# Patient Record
Sex: Female | Born: 1985 | Race: Black or African American | Hispanic: No | Marital: Single | Smoking: Never smoker
Health system: Southern US, Community
[De-identification: ages and names within clinical notes are randomized; demographics above are authoritative.]

## PROBLEM LIST (undated history)

## (undated) ENCOUNTER — Ambulatory Visit (HOSPITAL_COMMUNITY): Admission: EM | Payer: Medicaid Other | Source: Home / Self Care

## (undated) DIAGNOSIS — R443 Hallucinations, unspecified: Secondary | ICD-10-CM

## (undated) DIAGNOSIS — F32A Depression, unspecified: Secondary | ICD-10-CM

## (undated) DIAGNOSIS — F29 Unspecified psychosis not due to a substance or known physiological condition: Secondary | ICD-10-CM

## (undated) DIAGNOSIS — F329 Major depressive disorder, single episode, unspecified: Secondary | ICD-10-CM

## (undated) HISTORY — PX: NO PAST SURGERIES: SHX2092

---

## 2010-05-21 ENCOUNTER — Emergency Department (HOSPITAL_COMMUNITY)
Admission: EM | Admit: 2010-05-21 | Discharge: 2010-05-22 | Payer: Self-pay | Source: Home / Self Care | Admitting: Emergency Medicine

## 2010-11-01 LAB — DIFFERENTIAL
Basophils Relative: 0 % (ref 0–1)
Eosinophils Absolute: 0 10*3/uL (ref 0.0–0.7)
Eosinophils Relative: 1 % (ref 0–5)
Lymphs Abs: 2.5 10*3/uL (ref 0.7–4.0)
Monocytes Absolute: 0.6 10*3/uL (ref 0.1–1.0)
Monocytes Relative: 9 % (ref 3–12)
Neutrophils Relative %: 53 % (ref 43–77)

## 2010-11-01 LAB — URINALYSIS, ROUTINE W REFLEX MICROSCOPIC
Bilirubin Urine: NEGATIVE
Glucose, UA: NEGATIVE mg/dL
Hgb urine dipstick: NEGATIVE
Ketones, ur: 15 mg/dL — AB
Protein, ur: NEGATIVE mg/dL
Urobilinogen, UA: 0.2 mg/dL (ref 0.0–1.0)

## 2010-11-01 LAB — BASIC METABOLIC PANEL
CO2: 28 mEq/L (ref 19–32)
Calcium: 9.5 mg/dL (ref 8.4–10.5)
Chloride: 103 mEq/L (ref 96–112)
GFR calc Af Amer: >60 mL/min (ref >60)
Glucose, Bld: 97 mg/dL (ref 70–99)
Sodium: 139 mEq/L (ref 135–145)

## 2010-11-01 LAB — CBC
HCT: 38.1 % (ref 36.0–46.0)
Hemoglobin: 12.5 g/dL (ref 12.0–15.0)
MCH: 25.7 pg — ABNORMAL LOW (ref 26.0–34.0)
MCHC: 32.8 g/dL (ref 30.0–36.0)
MCV: 78.4 fL (ref 78.0–100.0)
RBC: 4.86 MIL/uL (ref 3.87–5.11)

## 2010-11-01 LAB — POCT PREGNANCY, URINE: Preg Test, Ur: NEGATIVE

## 2011-02-22 ENCOUNTER — Emergency Department (HOSPITAL_COMMUNITY)
Admission: EM | Admit: 2011-02-22 | Discharge: 2011-02-23 | Disposition: A | Discharge status: Home / Self Care | Payer: No Typology Code available for payment source | Attending: Emergency Medicine | Admitting: Emergency Medicine

## 2011-02-22 DIAGNOSIS — M542 Cervicalgia: Secondary | ICD-10-CM | POA: Insufficient documentation

## 2011-02-22 DIAGNOSIS — S139XXA Sprain of joints and ligaments of unspecified parts of neck, initial encounter: Secondary | ICD-10-CM | POA: Insufficient documentation

## 2011-02-22 DIAGNOSIS — M549 Dorsalgia, unspecified: Secondary | ICD-10-CM | POA: Insufficient documentation

## 2011-02-22 DIAGNOSIS — F329 Major depressive disorder, single episode, unspecified: Secondary | ICD-10-CM | POA: Insufficient documentation

## 2011-02-22 DIAGNOSIS — F3289 Other specified depressive episodes: Secondary | ICD-10-CM | POA: Insufficient documentation

## 2011-08-15 ENCOUNTER — Encounter: Payer: Self-pay | Admitting: *Deleted

## 2011-08-15 ENCOUNTER — Emergency Department (HOSPITAL_COMMUNITY)
Admission: EM | Admit: 2011-08-15 | Discharge: 2011-08-16 | Disposition: A | Discharge status: Home / Self Care | Payer: Self-pay | Attending: Emergency Medicine | Admitting: Emergency Medicine

## 2011-08-15 DIAGNOSIS — H81399 Other peripheral vertigo, unspecified ear: Secondary | ICD-10-CM | POA: Insufficient documentation

## 2011-08-15 NOTE — ED Notes (Signed)
She has had a headache with dizziness and feeling lightheaded for 2 days.  lmp 2 weeks ago

## 2011-08-16 LAB — POCT I-STAT, CHEM 8
BUN: 6 mg/dL (ref 6–23)
Calcium, Ion: 1.25 mmol/L (ref 1.12–1.32)
Chloride: 102 mEq/L (ref 96–112)
Creatinine, Ser: 0.6 mg/dL (ref 0.50–1.10)
TCO2: 28 mmol/L (ref 0–100)

## 2011-08-16 MED ORDER — MECLIZINE HCL 25 MG PO TABS: 25.0000 mg | ORAL_TABLET | Freq: Three times a day (TID) | ORAL | Status: AC | PRN

## 2011-08-16 NOTE — ED Provider Notes (Signed)
Medical screening examination/treatment/procedure(s) were performed by non-physician practitioner and as supervising physician I was immediately available for consultation/collaboration.   Lyanne Co, MD 08/16/11 [phone]

## 2011-08-16 NOTE — ED Provider Notes (Signed)
History     CSN: [id number]  Arrival date & time 08/15/11  2217   First MD Initiated Contact with Patient 08/16/11 0106      Chief Complaint  Patient presents with  . Dizziness     HPI  History provided by the patient and mother. Patient is a 25 year old female with no significant past medical history who presents with symptoms of generalized fatigue and this feeling some lightheadedness and dizziness for the past few days. Patient describes her dizziness and lightheadedness as short episodes of feeling a little cloudy headed in off balance. Symptoms are made worse by sudden movements or twisting of her head. Symptoms last only a few seconds are improved with remaining still or standing straight. Patient denies any headache symptoms to me. Patient also reports feeling increased fatigue and sleepiness throughout the day. Does report having poor sleep last night. Patient had a normal last. One to 2 weeks ago that lasted 3-4 days. Patient denies any other symptoms. Patient denies any recent URI symptoms, no fever no chills no sweats no cough no nasal congestion.   History reviewed. No pertinent past medical history.  History reviewed. No pertinent past surgical history.  History reviewed. No pertinent family history.  History  Substance Use Topics  . Smoking status: Never Smoker   . Smokeless tobacco: Not on file  . Alcohol Use: Yes    OB History    Grav Para Term Preterm Abortions TAB SAB Ect Mult Living                  Review of Systems  Constitutional: Negative for fever and chills.  Eyes: Negative for pain.  Respiratory: Negative for cough and shortness of breath.   Gastrointestinal: Negative for nausea, vomiting, abdominal pain, diarrhea and constipation.  Neurological: Positive for dizziness and light-headedness. Negative for syncope, weakness, numbness and headaches.  All other systems reviewed and are negative.    Allergies  Review of patient's allergies  indicates no known allergies.  Home Medications  No current outpatient prescriptions on file.  BP 120/67  Pulse 90  Temp(Src) 98.3 F (36.8 C) (Oral)  Resp 18  SpO2 100%  LMP 08/01/2011  Physical Exam  Nursing note and vitals reviewed. Constitutional: She is oriented to person, place, and time. She appears well-developed and well-nourished. No distress.  HENT:  Head: Normocephalic and atraumatic.  Right Ear: External ear normal.  Left Ear: External ear normal.  Mouth/Throat: Oropharynx is clear and moist.  Eyes: Conjunctivae and EOM are normal. Pupils are equal, round, and reactive to light.  Neck: Normal range of motion. Neck supple.  Cardiovascular: Normal rate and regular rhythm.   Pulmonary/Chest: Effort normal and breath sounds normal. She has no wheezes. She has no rales.  Abdominal: Soft. Bowel sounds are normal. There is no tenderness. There is no rebound and no guarding.  Musculoskeletal: Normal range of motion.  Neurological: She is alert and oriented to person, place, and time. She has normal strength. No cranial nerve deficit or sensory deficit. Coordination and gait normal.       Mild lateral nystagmus with Dix Hallpike maneuver to the left side.  Skin: Skin is warm and dry. No rash noted.  Psychiatric: She has a normal mood and affect. Her behavior is normal.    ED Course  Procedures (including critical care time)   Labs Reviewed  I-STAT, CHEM 8   . Patient i-STAT did not cross over in computer. Results are as follows:  Sodium 141, potassium 3.6, chloride 102, calcium 1.25, CO2 28, glucose 100, BUN 6, creatinine 0.6, hematocrit 43%, hemoglobin 14.6.   1. Peripheral vertigo      MDM  1:25 AM patient seen and evaluated. Patient in no acute distress.        Angus Seller, [place] 08/16/11 [phone]

## 2011-08-16 NOTE — ED Notes (Signed)
Rx given x1 D/c instructions reviewed w/ pt and family - pt and family deny any further questions or concerns at present.  

## 2011-11-19 ENCOUNTER — Encounter (HOSPITAL_COMMUNITY): Payer: Self-pay | Admitting: *Deleted

## 2011-11-19 ENCOUNTER — Emergency Department (HOSPITAL_COMMUNITY)
Admission: EM | Admit: 2011-11-19 | Discharge: 2011-11-19 | Disposition: A | Discharge status: Home / Self Care | Payer: Medicaid Other | Attending: Emergency Medicine | Admitting: Emergency Medicine

## 2011-11-19 DIAGNOSIS — F411 Generalized anxiety disorder: Secondary | ICD-10-CM | POA: Insufficient documentation

## 2011-11-19 DIAGNOSIS — F419 Anxiety disorder, unspecified: Secondary | ICD-10-CM

## 2011-11-19 DIAGNOSIS — R110 Nausea: Secondary | ICD-10-CM | POA: Insufficient documentation

## 2011-11-19 HISTORY — DX: Unspecified psychosis not due to a substance or known physiological condition: F29

## 2011-11-19 LAB — CBC
HCT: 39.4 % (ref 36.0–46.0)
MCH: 25.2 pg — ABNORMAL LOW (ref 26.0–34.0)
MCV: 75.9 fL — ABNORMAL LOW (ref 78.0–100.0)
Platelets: 277 10*3/uL (ref 150–400)
RDW: 12.9 % (ref 11.5–15.5)
WBC: 7.6 10*3/uL (ref 4.0–10.5)

## 2011-11-19 LAB — URINALYSIS, ROUTINE W REFLEX MICROSCOPIC
Bilirubin Urine: NEGATIVE
Glucose, UA: NEGATIVE mg/dL
Hgb urine dipstick: NEGATIVE
Specific Gravity, Urine: 1.005 (ref 1.005–1.030)

## 2011-11-19 LAB — ETHANOL: Alcohol, Ethyl (B): <11 mg/dL (ref 0–11)

## 2011-11-19 LAB — COMPREHENSIVE METABOLIC PANEL
Albumin: 4.4 g/dL (ref 3.5–5.2)
BUN: 5 mg/dL — ABNORMAL LOW (ref 6–23)
CO2: 23 mEq/L (ref 19–32)
Calcium: 9.8 mg/dL (ref 8.4–10.5)
Chloride: 100 mEq/L (ref 96–112)
Creatinine, Ser: 0.46 mg/dL — ABNORMAL LOW (ref 0.50–1.10)
GFR calc non Af Amer: >90 mL/min (ref >90)
Total Bilirubin: 0.3 mg/dL (ref 0.3–1.2)

## 2011-11-19 LAB — RAPID URINE DRUG SCREEN, HOSP PERFORMED
Cocaine: NOT DETECTED
Opiates: NOT DETECTED

## 2011-11-19 LAB — POCT PREGNANCY, URINE: Preg Test, Ur: NEGATIVE

## 2011-11-19 MED ORDER — LORAZEPAM 1 MG PO TABS
1.0000 mg | ORAL_TABLET | Freq: Once | ORAL | Status: AC
  Administered 2011-11-19: 1 mg via ORAL
  Filled 2011-11-19: qty 1

## 2011-11-19 MED ORDER — ONDANSETRON 4 MG PO TBDP
8.0000 mg | ORAL_TABLET | Freq: Once | ORAL | Status: AC
  Administered 2011-11-19: 8 mg via ORAL
  Filled 2011-11-19: qty 2

## 2011-11-19 MED ORDER — LORAZEPAM 1 MG PO TABS: 1.0000 mg | ORAL_TABLET | Freq: Three times a day (TID) | ORAL | Status: DC | PRN

## 2011-11-19 NOTE — ED Notes (Signed)
Pt is refusing blood draws.  Mother remains at the bedside.  Pt was wanded for security

## 2011-11-19 NOTE — ED Notes (Signed)
Pt called me to  her room admitted using weeds and marijuana in past , last use was couple years ago. Denies any recent use of any drugs.

## 2011-11-19 NOTE — ED Provider Notes (Signed)
History     CSN: [id number]  Arrival date & time 11/19/11  0101   First MD Initiated Contact with Patient 11/19/11 0406      Chief Complaint  Patient presents with  . Anxiety    (Consider location/radiation/quality/duration/timing/severity/associated sxs/prior treatment) Patient is a 26 y.o. female presenting with anxiety. The history is provided by the patient and a parent.  Anxiety  She had onset last evening of nausea and anxious feeling. The nausea is improving and anxiety seems to be improving but she is unable to get to sleep. Symptoms are moderate. She had an episode of a psychosis about 1-1/2 years ago and states this is actually feels similar to the way she felt when her psychosis started. It is not as severe as her psychosis. She denies abdominal pain, fever, chills, sweats. She denies dysuria. Last menses was 3 weeks ago and was normal. She denies dyspnea. She denies depression. She denies hallucinations.  Past Medical History  Diagnosis Date  . Psychosis     per pt and family last year    History reviewed. No pertinent past surgical history.  No family history on file.  History  Substance Use Topics  . Smoking status: Never Smoker   . Smokeless tobacco: Not on file  . Alcohol Use: Yes    OB History    Grav Para Term Preterm Abortions TAB SAB Ect Mult Living                  Review of Systems  All other systems reviewed and are negative.    Allergies  Review of patient's allergies indicates no known allergies.  Home Medications  No current outpatient prescriptions on file.  BP 121/69  Pulse 101  Temp(Src) 98.8 F (37.1 C) (Oral)  Resp 20  SpO2 100%  LMP 10/29/2011  Physical Exam  Nursing note and vitals reviewed.  26 year old female is resting comfortably and in no acute distress. Vital signs are significant for borderline tachycardia with heart rate 101. Oxygen saturation is 100% which is normal. Head is normal cephalic and atraumatic.  PERRLA, EOMI. Her [place] Guest is clear. Neck is nontender and supple. Back is nontender. Lungs are clear without rales, wheezes, rhonchi. Heart has regular rate rhythm without murmur. Abdomen is soft, flat, nontender without masses or hepatosplenomegaly and peristalsis is present. Extremities have full range of motion, no cyanosis or edema. Skin is warm and dry without rash. Neurologic: Mental status is normal, cranial nerves are intact, there are no focal motor or sensory deficits.  ED Course  Procedures (including critical care time)  Results for orders placed during the hospital encounter of 11/19/11  CBC      Component Value Range   WBC 7.6  4.0 - 10.5 (K/uL)   RBC 5.19 (*) 3.87 - 5.11 (MIL/uL)   Hemoglobin 13.1  12.0 - 15.0 (g/dL)   HCT 40.9  81.1 - 91.4 (%)   MCV 75.9 (*) 78.0 - 100.0 (fL)   MCH 25.2 (*) 26.0 - 34.0 (pg)   MCHC 33.2  30.0 - 36.0 (g/dL)   RDW 78.2  95.6 - 21.3 (%)   Platelets 277  150 - 400 (K/uL)  COMPREHENSIVE METABOLIC PANEL      Component Value Range   Sodium 137  135 - 145 (mEq/L)   Potassium 3.5  3.5 - 5.1 (mEq/L)   Chloride 100  96 - 112 (mEq/L)   CO2 23  19 - 32 (mEq/L)   Glucose, Bld 121 (*) 70 -  99 (mg/dL)   BUN 5 (*) 6 - 23 (mg/dL)   Creatinine, Ser 4.09 (*) 0.50 - 1.10 (mg/dL)   Calcium 9.8  8.4 - 81.1 (mg/dL)   Total Protein 8.1  6.0 - 8.3 (g/dL)   Albumin 4.4  3.5 - 5.2 (g/dL)   AST 14  0 - 37 (U/L)   ALT 10  0 - 35 (U/L)   Alkaline Phosphatase 59  39 - 117 (U/L)   Total Bilirubin 0.3  0.3 - 1.2 (mg/dL)   GFR calc non Af Amer >90  >90 (mL/min)   GFR calc Af Amer >90  >90 (mL/min)  ETHANOL      Component Value Range   Alcohol, Ethyl (B) <11  0 - 11 (mg/dL)  URINE RAPID DRUG SCREEN (HOSP PERFORMED)      Component Value Range   Opiates NONE DETECTED  NONE DETECTED    Cocaine NONE DETECTED  NONE DETECTED    Benzodiazepines NONE DETECTED  NONE DETECTED    Amphetamines NONE DETECTED  NONE DETECTED    Tetrahydrocannabinol NONE DETECTED  NONE  DETECTED    Barbiturates NONE DETECTED  NONE DETECTED   URINALYSIS, ROUTINE W REFLEX MICROSCOPIC      Component Value Range   Color, Urine YELLOW  YELLOW    APPearance CLEAR  CLEAR    Specific Gravity, Urine 1.005  1.005 - 1.030    pH 6.0  5.0 - 8.0    Glucose, UA NEGATIVE  NEGATIVE (mg/dL)   Hgb urine dipstick NEGATIVE  NEGATIVE    Bilirubin Urine NEGATIVE  NEGATIVE    Ketones, ur NEGATIVE  NEGATIVE (mg/dL)   Protein, ur NEGATIVE  NEGATIVE (mg/dL)   Urobilinogen, UA 0.2  0.0 - 1.0 (mg/dL)   Nitrite NEGATIVE  NEGATIVE    Leukocytes, UA NEGATIVE  NEGATIVE   POCT PREGNANCY, URINE      Component Value Range   Preg Test, Ur NEGATIVE  NEGATIVE    Zofran and lorazepam were ordered in the emergency Department the patient took them out and did not take them. Apparently she had some problems with psychiatric medications which have been prescribed in the past. An extensive discussion with the patient and her mother about the need for appropriate medication to treat psychiatric conditions. Although she is not psychotic today, her mental processes seem clearly disorganized and I am concerned that if she does not get started in psychiatric treatment that she could have an acute psychotic break. ACT Team has been consulted and she will be referred to the [facility].  1. Anxiety       MDM  Nausea of uncertain cause. Anxiety. It certainly is possible that she is in early stages of psychosis but there is no evidence of altered mental status at this point. Screening labs will be obtained and I will work on having her referred for outpatient management.        Dione Booze, MD 11/19/11 [phone]

## 2011-11-19 NOTE — Discharge Instructions (Signed)
You have a problem which needs to be evaluated by a psychiatrist. Please call the [facility] for an appointment. The phone number and information has been given to you by the ACT Team. You may take the Ativan which has been prescribed as often as 3 times a day. Take it as needed for anxiety.  Lorazepam tablets What is this medicine? LORAZEPAM (lor A ze pam) is a benzodiazepine. It is used to treat anxiety. This medicine may be used for other purposes; ask your health care provider or pharmacist if you have questions. What should I tell my health care provider before I take this medicine? They need to know if you have any of these conditions: -alcohol or drug abuse problem -bipolar disorder, depression, psychosis or other mental health condition -glaucoma -kidney or liver disease -lung disease or breathing difficulties -myasthenia gravis -Parkinson's disease -seizures or a history of seizures -suicidal thoughts -an unusual or allergic reaction to lorazepam, other benzodiazepines, foods, dyes, or preservatives -pregnant or trying to get pregnant -breast-feeding How should I use this medicine? Take this medicine by mouth with a glass of water. Follow the directions on the prescription label. If it upsets your stomach, take it with food or milk. Take your medicine at regular intervals. Do not take it more often than directed. Do not stop taking except on the advice of your doctor or health care professional. Talk to your pediatrician regarding the use of this medicine in children. Special care may be needed. Overdosage: If you think you have taken too much of this medicine contact a poison control center or emergency room at once. NOTE: This medicine is only for you. Do not share this medicine with others. What if I miss a dose? If you miss a dose, take it as soon as you can. If it is almost time for your next dose, take only that dose. Do not take double or extra  doses. What may interact with this medicine? -barbiturate medicines for inducing sleep or treating seizures, like phenobarbital -clozapine -medicines for depression, mental problems or psychiatric disturbances -medicines for sleep -phenytoin -probenecid -theophylline -valproic acid This list may not describe all possible interactions. Give your health care provider a list of all the medicines, herbs, non-prescription drugs, or dietary supplements you use. Also tell them if you smoke, drink alcohol, or use illegal drugs. Some items may interact with your medicine. What should I watch for while using this medicine? Visit your doctor or health care professional for regular checks on your progress. Your body may become dependent on this medicine, ask your doctor or health care professional if you still need to take it. However, if you have been taking this medicine regularly for some time, do not suddenly stop taking it. You must gradually reduce the dose or you may get severe side effects. Ask your doctor or health care professional for advice before increasing or decreasing the dose. Even after you stop taking this medicine it can still affect your body for several days. You may get drowsy or dizzy. Do not drive, use machinery, or do anything that needs mental alertness until you know how this medicine affects you. To reduce the risk of dizzy and fainting spells, do not stand or sit up quickly, especially if you are an older patient. Alcohol may increase dizziness and drowsiness. Avoid alcoholic drinks. Do not treat yourself for coughs, colds or allergies without asking your doctor or health care professional for advice. Some ingredients can increase  possible side effects. What side effects may I notice from receiving this medicine? Side effects that you should report to your doctor or health care professional as soon as possible: -changes in vision -confusion -depression -mood changes,  excitability or aggressive behavior -movement difficulty, staggering or jerky movements -muscle cramps -restlessness -weakness or tiredness Side effects that usually do not require medical attention (report to your doctor or health care professional if they continue or are bothersome): -constipation or diarrhea -difficulty sleeping, nightmares -dizziness, drowsiness -headache -nausea, vomiting This list may not describe all possible side effects. Call your doctor for medical advice about side effects. You may report side effects to FDA at 1-800-FDA-1088. Where should I keep my medicine? Keep out of the reach of children. This medicine can be abused. Keep your medicine in a safe place to protect it from theft. Do not share this medicine with anyone. Selling or giving away this medicine is dangerous and against the law. Store at room temperature between 20 and 25 degrees C (68 and 77 degrees F). Protect from light. Keep container tightly closed. Throw away any unused medicine after the expiration date. NOTE: This sheet is a summary. It may not cover all possible information. If you have questions about this medicine, talk to your doctor, pharmacist, or health care provider.  2012, [employer]. (02/05/2008 2:58:20 PM)

## 2011-11-19 NOTE — ED Notes (Signed)
Pt is here with her mother.  Pt is unable to sit still per her mother and "talking out of her head", pt ran out of the house this evening, per pt she felt "panicky".  Pt denies any auditory or visual hallucinations.  Pt denies any HI or SI.  Pt is calm and cooperative here in triage.  Pt states that these symptoms began today and denies any trigger for this.  Pts mother states that this occurred a year ago and pt was hospitalized at that time for a couple of days and it was felt that she may have psychosis (and ?scizophrenia).

## 2011-11-19 NOTE — ED Notes (Signed)
Pt showing signs of anxiety would not let phlebotomy get blood sample. Pt pretending to take her medication but palmed her ativan, than took it after i pointed it out to her.

## 2011-11-27 ENCOUNTER — Emergency Department (HOSPITAL_COMMUNITY)
Admission: EM | Admit: 2011-11-27 | Discharge: 2011-11-28 | Disposition: A | Discharge status: Other Acute Inpatient Hospital | Payer: Medicaid Other | Attending: Emergency Medicine | Admitting: Emergency Medicine

## 2011-11-27 ENCOUNTER — Encounter (HOSPITAL_COMMUNITY): Payer: Self-pay | Admitting: *Deleted

## 2011-11-27 DIAGNOSIS — F29 Unspecified psychosis not due to a substance or known physiological condition: Secondary | ICD-10-CM | POA: Insufficient documentation

## 2011-11-27 DIAGNOSIS — R000 Tachycardia, unspecified: Secondary | ICD-10-CM | POA: Insufficient documentation

## 2011-11-27 DIAGNOSIS — F489 Nonpsychotic mental disorder, unspecified: Secondary | ICD-10-CM | POA: Insufficient documentation

## 2011-11-27 DIAGNOSIS — G4700 Insomnia, unspecified: Secondary | ICD-10-CM | POA: Insufficient documentation

## 2011-11-27 DIAGNOSIS — F329 Major depressive disorder, single episode, unspecified: Secondary | ICD-10-CM

## 2011-11-27 LAB — COMPREHENSIVE METABOLIC PANEL
Alkaline Phosphatase: 62 U/L (ref 39–117)
BUN: 7 mg/dL (ref 6–23)
Calcium: 9.7 mg/dL (ref 8.4–10.5)
Creatinine, Ser: 0.6 mg/dL (ref 0.50–1.10)
GFR calc Af Amer: >90 mL/min (ref >90)
Glucose, Bld: 94 mg/dL (ref 70–99)
Potassium: 3.3 mEq/L — ABNORMAL LOW (ref 3.5–5.1)
Total Protein: 7.9 g/dL (ref 6.0–8.3)

## 2011-11-27 LAB — DIFFERENTIAL
Basophils Absolute: 0 10*3/uL (ref 0.0–0.1)
Lymphocytes Relative: 26 % (ref 12–46)
Monocytes Absolute: 0.5 10*3/uL (ref 0.1–1.0)
Neutro Abs: 4.2 10*3/uL (ref 1.7–7.7)

## 2011-11-27 LAB — URINALYSIS, ROUTINE W REFLEX MICROSCOPIC
Bilirubin Urine: NEGATIVE
Leukocytes, UA: NEGATIVE
Nitrite: NEGATIVE
Specific Gravity, Urine: 1.024 (ref 1.005–1.030)
pH: 7 (ref 5.0–8.0)

## 2011-11-27 LAB — CBC
HCT: 40.3 % (ref 36.0–46.0)
RBC: 5.22 MIL/uL — ABNORMAL HIGH (ref 3.87–5.11)
RDW: 13.1 % (ref 11.5–15.5)
WBC: 6.4 10*3/uL (ref 4.0–10.5)

## 2011-11-27 LAB — RAPID URINE DRUG SCREEN, HOSP PERFORMED
Amphetamines: NOT DETECTED
Benzodiazepines: NOT DETECTED
Cocaine: NOT DETECTED
Opiates: NOT DETECTED

## 2011-11-27 LAB — URINE MICROSCOPIC-ADD ON

## 2011-11-27 MED ORDER — LORAZEPAM 1 MG PO TABS
1.0000 mg | ORAL_TABLET | ORAL | Status: DC | PRN
  Administered 2011-11-27: 1 mg via ORAL
  Filled 2011-11-27: qty 1

## 2011-11-27 MED ORDER — TRAZODONE HCL 100 MG PO TABS: 100.0000 mg | ORAL_TABLET | Freq: Every day | ORAL | Status: DC

## 2011-11-27 MED ORDER — SODIUM CHLORIDE 0.9 % IV BOLUS (SEPSIS)
1000.0000 mL | Freq: Once | INTRAVENOUS | Status: AC
  Administered 2011-11-27: 1000 mL via INTRAVENOUS

## 2011-11-27 MED ORDER — RISPERIDONE 1 MG PO TABS
1.0000 mg | ORAL_TABLET | Freq: Two times a day (BID) | ORAL | Status: DC
  Administered 2011-11-28: 1 mg via ORAL
  Filled 2011-11-27: qty 1

## 2011-11-27 MED ORDER — ZOLPIDEM TARTRATE 5 MG PO TABS: 5.0000 mg | ORAL_TABLET | Freq: Every evening | ORAL | Status: DC | PRN

## 2011-11-27 MED ORDER — ONDANSETRON HCL 4 MG PO TABS: 4.0000 mg | ORAL_TABLET | Freq: Three times a day (TID) | ORAL | Status: DC | PRN

## 2011-11-27 NOTE — ED Provider Notes (Signed)
History     CSN: [id number]  Arrival date & time 11/27/11  1651   First MD Initiated Contact with Patient 11/27/11 1753      Chief Complaint  Patient presents with  . Medical Clearance    from [facility] & has a bed    (Consider location/radiation/quality/duration/timing/severity/associated sxs/prior treatment) HPI Comments: Patient here with mother for medical clearance - patient has been at [facility] since this past Thursday and they are awaiting a bed at [facility] - in trying to talk with the patient, she will begin a sentence and then not answer any further questions.  Mother reports that the patient has been under a lot of stress lately and was seen at [facility] on Tuesday of last week for anxiety - mother reports that the patient is not sleeping or eating and she is concerned about this.  The patient denies current suicidality and expresses wish to go home.  She denies any further issues at this time.  Patient is a 26 y.o. female presenting with mental health disorder. The history is provided by a parent and the police. No language interpreter was used.  Mental Health Problem The primary symptoms include dysphoric mood and disorganized speech. The primary symptoms do not include delusions, hallucinations, bizarre behavior, negative symptoms or somatic symptoms. The current episode started 1 to 2 weeks ago. This is a new problem.  The onset of the illness is precipitated by emotional stress. The degree of incapacity that she is experiencing as a consequence of her illness is moderate. Additional symptoms of the illness include anhedonia, insomnia, appetite change, fatigue, distractible and poor judgment. Additional symptoms of the illness do not include no hypersomnia, no unexpected weight change, no agitation, no psychomotor retardation, no feelings of worthlessness, no attention impairment, no euphoric mood, no increased goal-directed activity, no flight of ideas, no inflated self-esteem, no  decreased need for sleep, no visual change, no headaches, no abdominal pain or no seizures. She does not admit to suicidal ideas. She does not have a plan to commit suicide. She does not contemplate harming herself. She has not already injured self. She does not contemplate injuring another person. She has not already  injured another person.    Past Medical History  Diagnosis Date  . Psychosis     per pt and family last year    History reviewed. No pertinent past surgical history.  No family history on file.  History  Substance Use Topics  . Smoking status: Never Smoker   . Smokeless tobacco: Not on file  . Alcohol Use: Yes    OB History    Grav Para Term Preterm Abortions TAB SAB Ect Mult Living                  Review of Systems  Unable to perform ROS: Psychiatric disorder  Constitutional: Positive for appetite change and fatigue. Negative for unexpected weight change.  Gastrointestinal: Negative for abdominal pain.  Neurological: Negative for seizures and headaches.  Psychiatric/Behavioral: Positive for dysphoric mood. Negative for hallucinations and agitation. The patient has insomnia.     Allergies  Review of patient's allergies indicates no known allergies.  Home Medications   Current Outpatient Rx  Name Route Sig Dispense Refill  . LORAZEPAM 1 MG PO TABS Oral Take 1 mg by mouth every 4 (four) hours as needed. For agitation.    Marland Kitchen RISPERIDONE 1 MG PO TABS Oral Take 1 mg by mouth 2 (two) times daily.    Marland Kitchen  TRAZODONE HCL 100 MG PO TABS Oral Take 100 mg by mouth at bedtime.      BP 122/90  Pulse 112  Temp(Src) 98.7 F (37.1 C) (Oral)  Resp 18  Wt 159 lb 1.6 oz (72.167 kg)  SpO2 100%  LMP 10/29/2011  Physical Exam  Nursing note and vitals reviewed. Constitutional: She is oriented to person, place, and time. She appears well-developed and well-nourished. No distress.  HENT:  Head: Normocephalic and atraumatic.  Right Ear: External ear normal.  Left Ear:  External ear normal.  Nose: Nose normal.  Mouth/Throat: Oropharynx is clear and moist.  Eyes: Conjunctivae are normal. Pupils are equal, round, and reactive to light. No scleral icterus.  Neck: Normal range of motion. Neck supple.  Cardiovascular: Regular rhythm and normal heart sounds.  Exam reveals no gallop and no friction rub.   No murmur heard.      tachycardia  Pulmonary/Chest: Effort normal and breath sounds normal. No respiratory distress. She has no wheezes. She has no rales. She exhibits no tenderness.  Abdominal: Soft. Bowel sounds are normal. She exhibits no distension. There is no tenderness.  Musculoskeletal: Normal range of motion. She exhibits no edema and no tenderness.  Lymphadenopathy:    She has no cervical adenopathy.  Neurological: She is alert and oriented to person, place, and time. No cranial nerve deficit.  Skin: Skin is warm and dry. No rash noted. No erythema. No pallor.  Psychiatric: Her mood appears not anxious. Her affect is blunt. She is withdrawn. She expresses inappropriate judgment. She exhibits a depressed mood. She expresses no suicidal ideation. She expresses no suicidal plans. She is noncommunicative.    ED Course  Procedures (including critical care time)  Labs Reviewed  COMPREHENSIVE METABOLIC PANEL - Abnormal; Notable for the following:    Potassium 3.3 (*)    All other components within normal limits  CBC - Abnormal; Notable for the following:    RBC 5.22 (*)    MCV 77.2 (*)    MCH 25.5 (*)    All other components within normal limits  URINALYSIS, ROUTINE W REFLEX MICROSCOPIC - Abnormal; Notable for the following:    APPearance TURBID (*)    Hgb urine dipstick LARGE (*)    All other components within normal limits  URINE MICROSCOPIC-ADD ON - Abnormal; Notable for the following:    Bacteria, UA FEW (*)    All other components within normal limits  URINE RAPID DRUG SCREEN (HOSP PERFORMED)  DIFFERENTIAL  TSH   No results found. Results  for orders placed during the hospital encounter of 11/27/11  URINE RAPID DRUG SCREEN (HOSP PERFORMED)      Component Value Range   Opiates NONE DETECTED  NONE DETECTED    Cocaine NONE DETECTED  NONE DETECTED    Benzodiazepines NONE DETECTED  NONE DETECTED    Amphetamines NONE DETECTED  NONE DETECTED    Tetrahydrocannabinol NONE DETECTED  NONE DETECTED    Barbiturates NONE DETECTED  NONE DETECTED   COMPREHENSIVE METABOLIC PANEL      Component Value Range   Sodium 138  135 - 145 (mEq/L)   Potassium 3.3 (*) 3.5 - 5.1 (mEq/L)   Chloride 102  96 - 112 (mEq/L)   CO2 25  19 - 32 (mEq/L)   Glucose, Bld 94  70 - 99 (mg/dL)   BUN 7  6 - 23 (mg/dL)   Creatinine, Ser 4.54  0.50 - 1.10 (mg/dL)   Calcium 9.7  8.4 - 09.8 (mg/dL)  Total Protein 7.9  6.0 - 8.3 (g/dL)   Albumin 4.1  3.5 - 5.2 (g/dL)   AST 14  0 - 37 (U/L)   ALT 13  0 - 35 (U/L)   Alkaline Phosphatase 62  39 - 117 (U/L)   Total Bilirubin 0.3  0.3 - 1.2 (mg/dL)   GFR calc non Af Amer >90  >90 (mL/min)   GFR calc Af Amer >90  >90 (mL/min)  CBC      Component Value Range   WBC 6.4  4.0 - 10.5 (K/uL)   RBC 5.22 (*) 3.87 - 5.11 (MIL/uL)   Hemoglobin 13.3  12.0 - 15.0 (g/dL)   HCT 21.3  08.6 - 57.8 (%)   MCV 77.2 (*) 78.0 - 100.0 (fL)   MCH 25.5 (*) 26.0 - 34.0 (pg)   MCHC 33.0  30.0 - 36.0 (g/dL)   RDW 46.9  62.9 - 52.8 (%)   Platelets 335  150 - 400 (K/uL)  DIFFERENTIAL      Component Value Range   Neutrophils Relative 65  43 - 77 (%)   Neutro Abs 4.2  1.7 - 7.7 (K/uL)   Lymphocytes Relative 26  12 - 46 (%)   Lymphs Abs 1.7  0.7 - 4.0 (K/uL)   Monocytes Relative 8  3 - 12 (%)   Monocytes Absolute 0.5  0.1 - 1.0 (K/uL)   Eosinophils Relative 0  0 - 5 (%)   Eosinophils Absolute 0.0  0.0 - 0.7 (K/uL)   Basophils Relative 0  0 - 1 (%)   Basophils Absolute 0.0  0.0 - 0.1 (K/uL)  URINALYSIS, ROUTINE W REFLEX MICROSCOPIC      Component Value Range   Color, Urine YELLOW  YELLOW    APPearance TURBID (*) CLEAR    Specific  Gravity, Urine 1.024  1.005 - 1.030    pH 7.0  5.0 - 8.0    Glucose, UA NEGATIVE  NEGATIVE (mg/dL)   Hgb urine dipstick LARGE (*) NEGATIVE    Bilirubin Urine NEGATIVE  NEGATIVE    Ketones, ur NEGATIVE  NEGATIVE (mg/dL)   Protein, ur NEGATIVE  NEGATIVE (mg/dL)   Urobilinogen, UA 0.2  0.0 - 1.0 (mg/dL)   Nitrite NEGATIVE  NEGATIVE    Leukocytes, UA NEGATIVE  NEGATIVE   URINE MICROSCOPIC-ADD ON      Component Value Range   RBC / HPF 11-20  <3 (RBC/hpf)   Bacteria, UA FEW (*) RARE    Urine-Other AMORPHOUS URATES/PHOSPHATES     No results found.   Date: 11/28/2011  Rate: 98  Rhythm: normal sinus rhythm  QRS Axis: normal  Intervals: normal  ST/T Wave abnormalities: early repolarization  Conduction Disutrbances:none  Narrative Interpretation: Revivewed by Dr. Linwood Dibbles  Old EKG Reviewed: none available    Depression Insomnia    MDM  Patient here with mother who is requesting a medical clearance for the patient though she has been a resident at [facility] for about 5 days - she is currently on the waiting list for [facility] for the supposed psychosis.  Patient is now more communicative but the mother cuts her off and does not let her speak.  The mother has multiple demands and has requested "a brain scan" as well as "thyroid testing".  When I decided to discharge the patient back to [facility], the mother is now requesting an EKG.  We will get this though there is no clinical indication for this.  I have sent off the TSH, have  given the patient two liters of fluids with improvement in tachycardia - I doubt dehydration but the mother is positive that the patient is dehydrated.  Labs do not currently support this.  Heart rate now down to 105.  Plan to discharge the patient back to [facility].        Izola Price [place], [place] 11/28/11 0040

## 2011-11-27 NOTE — ED Notes (Signed)
Pt has a bed at [facility] pt is brought in for medical clearence

## 2011-11-27 NOTE — ED Notes (Addendum)
Per pt's family, pt started to get anxious last Tuesday (4/2) with no known trigger and was seen at [facility]. She was treated for anxiety and followed up with a psychiatrist at [facility]. Pt was admitted to [facility] on Thursday (4/4) and was seen by a psychiatrist on admission and Monday (4/8). Pt has not slept well in over a week and reports not being able to fall asleep or stay asleep. Pt has not been eating but when she does eat she has loose stool. Pt has been urinating more than usual. Pt will respond to some questions asked but sometimes she looks to her mother to respond for her. Pt is very quiet when speaking and doesn't always finish her sentences. Pt denies hallucinations or delusion. Pt denies SI or HI.

## 2011-11-27 NOTE — ED Notes (Signed)
Pt belongings sent home with pt's mother. 

## 2011-11-27 NOTE — ED Notes (Signed)
Pt is reluctant to answer questions, is here for clearance & to return to [facility], is oriented to date (4/10), cannot state the disorder she has been dx'd with previously.

## 2011-11-28 ENCOUNTER — Other Ambulatory Visit: Payer: Self-pay

## 2011-11-28 NOTE — ED Provider Notes (Signed)
Medical screening examination/treatment/procedure(s) were performed by non-physician practitioner and as supervising physician I was immediately available for consultation/collaboration.   Jadon Harbaugh, MD 11/28/11 1654 

## 2011-11-28 NOTE — ED Notes (Signed)
EKG signed and given to EDP, Steinl,J.,MD.

## 2011-11-28 NOTE — Discharge Instructions (Signed)
Depression, Adolescent and Adult  Depression is a true and treatable medical condition. In general there are two kinds of depression:   Depression we all experience in some form. For example depression from the death of a loved one, financial distress or natural disasters will trigger or increase depression.    Clinical depression, on the other hand, appears without an apparent cause or reason. This depression is a disease. Depression may be caused by chemical imbalance in the body and brain or may come as a response to a physical illness. Alcohol and other drugs can cause depression.   DIAGNOSIS    The diagnosis of depression is usually based upon symptoms and medical history.  TREATMENT    Treatments for depression fall into three categories. These are:   Drug therapy. There are many medicines that treat depression. Responses may vary and sometimes trial and error is necessary to determine the best medicines and dosage for a particular patient.    Psychotherapy, also called talking treatments, helps people resolve their problems by looking at them from a different point of view and by giving people insight into their own personal makeup. Traditional psychotherapy looks at a childhood source of a problem. Other psychotherapy will look at current conflicts and move toward solving those. If the cause of depression is drug use, counseling is available to help abstain. In time the depression will usually improve. If there were underlying causes for the chemical use, they can be addressed.    ECT (electroconvulsive therapy) or shock treatment is not as commonly used today. It is a very effective treatment for severe suicidal depression. During ECT electrical impulses are applied to the head. These impulses cause a generalized seizure. It can be effective but causes a loss of memory for recent events. Sometimes this loss of memory may include the last several months.    Treat all depression or suicide threats as serious. Obtain professional help. Do not wait to see if serious depression will get better over time without help. Seek help for yourself or those around you.  In the [place] the number to the National Suicide Help Lines With 24 Hour Help Are:  1-800-SUICIDE  1-800-784-2433  Document Released: 08/02/2000 Document Revised: 07/25/2011 Document Reviewed: 03/23/2008  ExitCare Patient Information 2012 [employer].    Emotional Crisis  Part of your problem today may be due to an emotional crisis. Emotional states can cause many different physical signs and symptoms. These may include:   Chest or stomach pain.    Fluttering heartbeat.    Passing out.    Breathing difficulty.    Headaches.    Trembling.    Hot or cold flashes.    Numbness.    Dizziness.    Unusual muscle pain or fatigue.    Insomnia.   When you have other medical problems, they are often made worse by emotional upsets.  Emotional crises can increase your stress and anxiety. Finding ways to reduce your stress level can make you feel better. You will become more capable of dealing with these emotional states. Regular physical exercise such as walking can be very beneficial. Counseling or medicine to treat anxiety or depression may also be needed. See your caregiver if you have further problems or questions about your condition.  Document Released: 08/05/2005 Document Revised: 07/25/2011 Document Reviewed: 01/20/2007  ExitCare Patient Information 2012 [employer].    Insomnia   Insomnia is frequent trouble falling and/or staying asleep. Insomnia can be a long term problem   or a short term problem. Both are common. Insomnia can be a short term problem when the wakefulness is related to a certain stress or worry. Long term insomnia is often related to ongoing stress during waking hours and/or poor sleeping habits. Overtime, sleep deprivation itself can make the problem worse. Every little thing feels more severe because you are overtired and your ability to cope is decreased.  CAUSES     Stress, anxiety, and depression.    Poor sleeping habits.    Distractions such as TV in the bedroom.    Naps close to bedtime.    Engaging in emotionally charged conversations before bed.    Technical reading before sleep.    Alcohol and other sedatives. They may make the problem worse. They can hurt normal sleep patterns and normal dream activity.    Stimulants such as caffeine for several hours prior to bedtime.    Pain syndromes and shortness of breath can cause insomnia.    Exercise late at night.    Changing time zones may cause sleeping problems (jet lag).   It is sometimes helpful to have someone observe your sleeping patterns. They should look for periods of not breathing during the night (sleep apnea). They should also look to see how long those periods last. If you live alone or observers are uncertain, you can also be observed at a sleep clinic where your sleep patterns will be professionally monitored. Sleep apnea requires a checkup and treatment. Give your caregivers your medical history. Give your caregivers observations your family has made about your sleep.    SYMPTOMS     Not feeling rested in the morning.    Anxiety and restlessness at bedtime.    Difficulty falling and staying asleep.   TREATMENT      Your caregiver may prescribe treatment for an underlying medical disorders. Your caregiver can give advice or help if you are using alcohol or other drugs for self-medication. Treatment of underlying problems will usually eliminate insomnia problems.    Medications can be prescribed for short time use. They are generally not recommended for lengthy use.    Over-the-counter sleep medicines are not recommended for lengthy use. They can be habit forming.    You can promote easier sleeping by making lifestyle changes such as:    Using relaxation techniques that help with breathing and reduce muscle tension.    Exercising earlier in the day.    Changing your diet and the time of your last meal. No night time snacks.    Establish a regular time to go to bed.    Counseling can help with stressful problems and worry.    Soothing music and white noise may be helpful if there are background noises you cannot remove.    Stop tedious detailed work at least one hour before bedtime.   HOME CARE INSTRUCTIONS     Keep a diary. Inform your caregiver about your progress. This includes any medication side effects. See your caregiver regularly. Take note of:    Times when you are asleep.    Times when you are awake during the night.    The quality of your sleep.    How you feel the next day.   This information will help your caregiver care for you.   Get out of bed if you are still awake after 15 minutes. Read or do some quiet activity. Keep the lights down. Wait until you feel sleepy and go back to bed.      Keep regular sleeping and waking hours. Avoid naps.    Exercise regularly.    Avoid distractions at bedtime. Distractions include watching television or engaging in any intense or detailed activity like attempting to balance the household checkbook.     Develop a bedtime ritual. Keep a familiar routine of bathing, brushing your teeth, climbing into bed at the same time each night, listening to soothing music. Routines increase the success of falling to sleep faster.    Use relaxation techniques. This can be using breathing and muscle tension release routines. It can also include visualizing peaceful scenes. You can also help control troubling or intruding thoughts by keeping your mind occupied with boring or repetitive thoughts like the old concept of counting sheep. You can make it more creative like imagining planting one beautiful flower after another in your backyard garden.    During your day, work to eliminate stress. When this is not possible use some of the previous suggestions to help reduce the anxiety that accompanies stressful situations.   MAKE SURE YOU:     Understand these instructions.    Will watch your condition.    Will get help right away if you are not doing well or get worse.   Document Released: 08/02/2000 Document Revised: 07/25/2011 Document Reviewed: 09/02/2007  Exit[employer] Patient Information 2012 [employer].

## 2012-03-10 ENCOUNTER — Ambulatory Visit (HOSPITAL_COMMUNITY): Payer: No Typology Code available for payment source | Admitting: Licensed Clinical Social Worker

## 2012-03-18 ENCOUNTER — Ambulatory Visit (HOSPITAL_COMMUNITY): Payer: No Typology Code available for payment source | Admitting: Physician Assistant

## 2012-06-02 ENCOUNTER — Ambulatory Visit (HOSPITAL_COMMUNITY): Payer: Medicaid Other | Admitting: Psychology

## 2013-05-17 ENCOUNTER — Encounter (HOSPITAL_COMMUNITY): Payer: Self-pay | Admitting: Nurse Practitioner

## 2013-05-17 ENCOUNTER — Emergency Department (HOSPITAL_COMMUNITY)
Admission: EM | Admit: 2013-05-17 | Discharge: 2013-05-17 | Disposition: A | Discharge status: Home / Self Care | Payer: Medicaid Other | Attending: Emergency Medicine | Admitting: Emergency Medicine

## 2013-05-17 ENCOUNTER — Emergency Department (HOSPITAL_COMMUNITY): Payer: Medicaid Other

## 2013-05-17 DIAGNOSIS — R079 Chest pain, unspecified: Secondary | ICD-10-CM | POA: Insufficient documentation

## 2013-05-17 DIAGNOSIS — F3289 Other specified depressive episodes: Secondary | ICD-10-CM | POA: Insufficient documentation

## 2013-05-17 DIAGNOSIS — Z888 Allergy status to other drugs, medicaments and biological substances status: Secondary | ICD-10-CM | POA: Insufficient documentation

## 2013-05-17 DIAGNOSIS — F329 Major depressive disorder, single episode, unspecified: Secondary | ICD-10-CM | POA: Insufficient documentation

## 2013-05-17 DIAGNOSIS — Z79899 Other long term (current) drug therapy: Secondary | ICD-10-CM | POA: Insufficient documentation

## 2013-05-17 DIAGNOSIS — F29 Unspecified psychosis not due to a substance or known physiological condition: Secondary | ICD-10-CM | POA: Insufficient documentation

## 2013-05-17 HISTORY — DX: Depression, unspecified: F32.A

## 2013-05-17 HISTORY — DX: Major depressive disorder, single episode, unspecified: F32.9

## 2013-05-17 LAB — CBC
HCT: 38.3 % (ref 36.0–46.0)
Platelets: 311 10*3/uL (ref 150–400)
RDW: 13.3 % (ref 11.5–15.5)
WBC: 5.7 10*3/uL (ref 4.0–10.5)

## 2013-05-17 LAB — POCT I-STAT TROPONIN I: Troponin i, poc: 0 ng/mL (ref 0.00–0.08)

## 2013-05-17 LAB — BASIC METABOLIC PANEL
BUN: 10 mg/dL (ref 6–23)
Chloride: 102 mEq/L (ref 96–112)
GFR calc Af Amer: >90 mL/min (ref >90)
Potassium: 3.9 mEq/L (ref 3.5–5.1)
Sodium: 140 mEq/L (ref 135–145)

## 2013-05-17 MED ORDER — NAPROXEN 500 MG PO TABS: 500.0000 mg | ORAL_TABLET | Freq: Two times a day (BID) | ORAL | Status: DC

## 2013-05-17 NOTE — ED Provider Notes (Signed)
CSN: [id number]     Arrival date & time 05/17/13  1536 History   First MD Initiated Contact with Patient 05/17/13 1933     Chief Complaint  Patient presents with  . Chest Pain   (Consider location/radiation/quality/duration/timing/severity/associated sxs/prior Treatment) HPI Lorraine Bryant is a 27 y.o. female presents emergency department complaining of intermittent left-sided chest pain. States chest pain has been there for about 6 months. Patient states pain comes and goes. States as of the same spot between her ribs on the left side. Describes pain as sharp. Denies any pleuritic components. Patient denies any cough. She denies any shortness of breath, dizziness, nausea, diaphoresis. Patient denies any recent travel or being on any birth control pills or having any recent surgeries. Patient denies taking any medications for this. Nothing is making her symptoms better or worse when she's having them. Patient states she's currently symptom-free. States her mother made her come to emergency department today because the pain lasted longer than usual. Patient states pain lasted for about 30 minutes earlier today. Pain is not exertional it is not associated with eating. Patient denies any injuries.   Past Medical History  Diagnosis Date  . Psychosis     per pt and family last year  . Depression    History reviewed. No pertinent past surgical history. History reviewed. No pertinent family history. History  Substance Use Topics  . Smoking status: Never Smoker   . Smokeless tobacco: Not on file  . Alcohol Use: No   OB History   Grav Para Term Preterm Abortions TAB SAB Ect Mult Living                 Review of Systems  Constitutional: Negative for fever, chills and diaphoresis.  HENT: Negative for neck pain and neck stiffness.   Respiratory: Negative for cough, chest tightness and shortness of breath.   Cardiovascular: Positive for chest pain. Negative for palpitations and leg swelling.   Gastrointestinal: Negative for nausea, vomiting, abdominal pain and diarrhea.  Genitourinary: Negative for dysuria, flank pain, vaginal bleeding, vaginal discharge, vaginal pain and pelvic pain.  Musculoskeletal: Negative for myalgias and arthralgias.  Skin: Negative for rash.  Neurological: Negative for dizziness, weakness and headaches.  All other systems reviewed and are negative.    Allergies  Abilify  Home Medications   Current Outpatient Rx  Name  Route  Sig  Dispense  Refill  . carbamazepine (TEGRETOL XR) 200 MG 12 hr tablet   Oral   Take 200 mg by mouth 2 (two) times daily.         . fluPHENAZine (PROLIXIN) 2.5 MG tablet   Oral   Take 2.5 mg by mouth at bedtime.          BP 105/88  Pulse 91  Temp(Src) 99.4 F (37.4 C)  Resp 18  SpO2 98%  LMP 05/10/2013 Physical Exam  Nursing note and vitals reviewed. Constitutional: She appears well-developed and well-nourished. No distress.  HENT:  Head: Normocephalic.  Eyes: Conjunctivae are normal.  Neck: Neck supple.  Cardiovascular: Normal rate, regular rhythm and normal heart sounds.   Pulmonary/Chest: Effort normal and breath sounds normal. No respiratory distress. She has no wheezes. She has no rales. She exhibits no tenderness.  Abdominal: Soft. Bowel sounds are normal. She exhibits no distension. There is no tenderness. There is no rebound.  Musculoskeletal: She exhibits no edema.  Neurological: She is alert.  Skin: Skin is warm and dry.  Psychiatric: She has a normal mood  and affect. Her behavior is normal.    ED Course  Procedures (including critical care time) Labs Review Labs Reviewed  CBC - Abnormal; Notable for the following:    MCV 76.9 (*)    MCH 25.5 (*)    All other components within normal limits  BASIC METABOLIC PANEL - Abnormal; Notable for the following:    Glucose, Bld 100 (*)    All other components within normal limits  POCT I-STAT TROPONIN I   Imaging Review Dg Chest 2  View  05/17/2013   *RADIOLOGY REPORT*  Clinical Data: Chest pain  CHEST - 2 VIEW  Comparison: None.  Findings: Heart size and vascular pattern are normal.  Lungs are clear.  No pleural effusions.  IMPRESSION: Negative   Original Report Authenticated By: Esperanza Heir, M.D.     Date: 05/17/2013  Rate: 75  Rhythm: normal sinus rhythm  QRS Axis: normal  Intervals: normal  ST/T Wave abnormalities: normal  Conduction Disutrbances:none  Narrative Interpretation: Poor R wave progression  Old EKG Reviewed: none available   MDM   1. Chest pain    Patient with nonspecific, intermittent left-sided sharp chest pain. Pain sounds atypical. She is PERC negative with no risk factors for a PE. Her EKG here is unremarkable, troponin x1 is negative. CXR negative. She is chest pain-free at this time. I suspect in her case pain is most likely musculoskeletal. We'll discharge her home with trial of anti-inflammatories for her pain. She'll need close followup with primary care Dr.   Ceasar Mons Vitals:   05/17/13 1549  BP: 105/88  Pulse: 91  Temp: 99.4 F (37.4 C)  Resp: 18  SpO2: 98%     Salli Bodin A Vashon Arch, PA-C 05/17/13 2030

## 2013-05-17 NOTE — ED Notes (Signed)
Pt c/o intermittent L sided CP for past months, today pain has been more persistent. No noted activity at onset, states pain resolves without treatment. No pain now

## 2013-05-19 NOTE — ED Provider Notes (Signed)
Medical screening examination/treatment/procedure(s) were performed by non-physician practitioner and as supervising physician I was immediately available for consultation/collaboration.  Hurman Horn, MD 05/19/13 [phone]

## 2014-05-07 ENCOUNTER — Emergency Department (HOSPITAL_COMMUNITY)
Admission: EM | Admit: 2014-05-07 | Discharge: 2014-05-07 | Disposition: A | Discharge status: Home / Self Care | Payer: Medicaid Other | Attending: Emergency Medicine | Admitting: Emergency Medicine

## 2014-05-07 ENCOUNTER — Encounter (HOSPITAL_COMMUNITY): Payer: Self-pay | Admitting: Emergency Medicine

## 2014-05-07 DIAGNOSIS — M2550 Pain in unspecified joint: Secondary | ICD-10-CM | POA: Diagnosis not present

## 2014-05-07 DIAGNOSIS — Z79899 Other long term (current) drug therapy: Secondary | ICD-10-CM | POA: Insufficient documentation

## 2014-05-07 DIAGNOSIS — R5381 Other malaise: Secondary | ICD-10-CM | POA: Diagnosis not present

## 2014-05-07 DIAGNOSIS — R42 Dizziness and giddiness: Secondary | ICD-10-CM | POA: Insufficient documentation

## 2014-05-07 DIAGNOSIS — R5383 Other fatigue: Secondary | ICD-10-CM | POA: Diagnosis not present

## 2014-05-07 DIAGNOSIS — R51 Headache: Secondary | ICD-10-CM | POA: Insufficient documentation

## 2014-05-07 DIAGNOSIS — F329 Major depressive disorder, single episode, unspecified: Secondary | ICD-10-CM | POA: Insufficient documentation

## 2014-05-07 DIAGNOSIS — Z3202 Encounter for pregnancy test, result negative: Secondary | ICD-10-CM | POA: Diagnosis not present

## 2014-05-07 DIAGNOSIS — R509 Fever, unspecified: Secondary | ICD-10-CM | POA: Diagnosis present

## 2014-05-07 DIAGNOSIS — F3289 Other specified depressive episodes: Secondary | ICD-10-CM | POA: Diagnosis not present

## 2014-05-07 DIAGNOSIS — M542 Cervicalgia: Secondary | ICD-10-CM | POA: Insufficient documentation

## 2014-05-07 DIAGNOSIS — R6889 Other general symptoms and signs: Secondary | ICD-10-CM

## 2014-05-07 LAB — COMPREHENSIVE METABOLIC PANEL
ALBUMIN: 4 g/dL (ref 3.5–5.2)
ALT: 113 U/L — AB (ref 0–35)
AST: 167 U/L — AB (ref 0–37)
Alkaline Phosphatase: 105 U/L (ref 39–117)
Anion gap: 16 — ABNORMAL HIGH (ref 5–15)
BUN: 5 mg/dL — ABNORMAL LOW (ref 6–23)
CALCIUM: 9.1 mg/dL (ref 8.4–10.5)
CO2: 23 mEq/L (ref 19–32)
Chloride: 97 mEq/L (ref 96–112)
Creatinine, Ser: 0.63 mg/dL (ref 0.50–1.10)
GFR calc Af Amer: >90 mL/min (ref >90)
GFR calc non Af Amer: >90 mL/min (ref >90)
Glucose, Bld: 110 mg/dL — ABNORMAL HIGH (ref 70–99)
POTASSIUM: 4.5 meq/L (ref 3.7–5.3)
Sodium: 136 mEq/L — ABNORMAL LOW (ref 137–147)
TOTAL PROTEIN: 8.2 g/dL (ref 6.0–8.3)
Total Bilirubin: 0.3 mg/dL (ref 0.3–1.2)

## 2014-05-07 LAB — URINE MICROSCOPIC-ADD ON

## 2014-05-07 LAB — URINALYSIS, ROUTINE W REFLEX MICROSCOPIC
BILIRUBIN URINE: NEGATIVE
Glucose, UA: NEGATIVE mg/dL
Ketones, ur: NEGATIVE mg/dL
Nitrite: NEGATIVE
PH: 6 (ref 5.0–8.0)
Protein, ur: NEGATIVE mg/dL
SPECIFIC GRAVITY, URINE: 1.006 (ref 1.005–1.030)
Urobilinogen, UA: 0.2 mg/dL (ref 0.0–1.0)

## 2014-05-07 LAB — MONONUCLEOSIS SCREEN: MONO SCREEN: NEGATIVE

## 2014-05-07 LAB — RAPID STREP SCREEN (MED CTR MEBANE ONLY): STREPTOCOCCUS, GROUP A SCREEN (DIRECT): NEGATIVE

## 2014-05-07 LAB — PREGNANCY, URINE: PREG TEST UR: NEGATIVE

## 2014-05-07 MED ORDER — METHOCARBAMOL 500 MG PO TABS
1000.0000 mg | ORAL_TABLET | Freq: Once | ORAL | Status: AC
  Administered 2014-05-07: 1000 mg via ORAL
  Filled 2014-05-07: qty 2

## 2014-05-07 MED ORDER — KETOROLAC TROMETHAMINE 60 MG/2ML IM SOLN
60.0000 mg | Freq: Once | INTRAMUSCULAR | Status: AC
  Administered 2014-05-07: 60 mg via INTRAMUSCULAR
  Filled 2014-05-07: qty 2

## 2014-05-07 MED ORDER — OSELTAMIVIR PHOSPHATE 75 MG PO CAPS: 75.0000 mg | ORAL_CAPSULE | Freq: Two times a day (BID) | ORAL | Status: DC

## 2014-05-07 MED ORDER — IBUPROFEN 600 MG PO TABS: 600.0000 mg | ORAL_TABLET | Freq: Four times a day (QID) | ORAL | Status: DC | PRN

## 2014-05-07 MED ORDER — ONDANSETRON 4 MG PO TBDP: ORAL_TABLET | ORAL | Status: DC

## 2014-05-07 MED ORDER — SODIUM CHLORIDE 0.9 % IV BOLUS (SEPSIS): 1000.0000 mL | Freq: Once | INTRAVENOUS | Status: DC

## 2014-05-07 MED ORDER — KETOROLAC TROMETHAMINE 30 MG/ML IJ SOLN
30.0000 mg | Freq: Once | INTRAMUSCULAR | Status: DC
  Filled 2014-05-07: qty 1

## 2014-05-07 MED ORDER — METHOCARBAMOL 500 MG PO TABS: 1000.0000 mg | ORAL_TABLET | Freq: Three times a day (TID) | ORAL | Status: DC | PRN

## 2014-05-07 MED ORDER — ACETAMINOPHEN 325 MG PO TABS
650.0000 mg | ORAL_TABLET | Freq: Once | ORAL | Status: AC
  Administered 2014-05-07: 650 mg via ORAL
  Filled 2014-05-07: qty 2

## 2014-05-07 MED ORDER — OSELTAMIVIR PHOSPHATE 75 MG PO CAPS
75.0000 mg | ORAL_CAPSULE | Freq: Once | ORAL | Status: AC
  Administered 2014-05-07: 75 mg via ORAL
  Filled 2014-05-07: qty 1

## 2014-05-07 NOTE — ED Notes (Signed)
Pt c/o HA x 6 days, body aches and neck stiffness,fever onset today. Denies n/v/d, sore throat, no appetite. Pt unaware she was febrile

## 2014-05-07 NOTE — ED Notes (Signed)
Pt aware of medication and follow up. Pt told to return to ED if symptoms worsen.

## 2014-05-07 NOTE — ED Provider Notes (Signed)
CSN: [id number]     Arrival date & time 05/07/14  0008 History   First MD Initiated Contact with Patient 05/07/14 0501     Chief Complaint  Patient presents with  . Fever     (Consider location/radiation/quality/duration/timing/severity/associated sxs/prior Treatment) HPI Patient presents with one day of flulike symptoms. She's had fever, diffuse myalgias and arthralgias, fatigue, headache. She denies any visual changes. She has no focal weakness. Take a gradual onset and frontal described as a throbbing pain. She's had no congestion, rhinorrhea or cough. Denies sore throat. Patient denies any sick contacts. No recent travel. No tick or insect bites. Patient has had some muscle spasms in her neck. Past Medical History  Diagnosis Date  . Psychosis     per pt and family last year  . Depression    History reviewed. No pertinent past surgical history. No family history on file. History  Substance Use Topics  . Smoking status: Never Smoker   . Smokeless tobacco: Not on file  . Alcohol Use: No   OB History   Grav Para Term Preterm Abortions TAB SAB Ect Mult Living                 Review of Systems  Constitutional: Positive for fever and fatigue. Negative for chills and diaphoresis.  HENT: Negative for congestion, ear pain, rhinorrhea, sinus pressure and sore throat.   Eyes: Negative for photophobia and visual disturbance.  Respiratory: Negative for cough, shortness of breath and wheezing.   Cardiovascular: Negative for chest pain, palpitations and leg swelling.  Gastrointestinal: Negative for nausea, vomiting, abdominal pain and diarrhea.  Genitourinary: Negative for dysuria, frequency and flank pain.  Musculoskeletal: Positive for arthralgias, myalgias and neck pain. Negative for back pain and neck stiffness.  Skin: Negative for rash and wound.  Neurological: Positive for dizziness, light-headedness and headaches. Negative for syncope, weakness and numbness.  All other systems  reviewed and are negative.     Allergies  Abilify  Home Medications   Prior to Admission medications   Medication Sig Start Date End Date Taking? Authorizing Provider  carbamazepine (TEGRETOL XR) 200 MG 12 hr tablet Take 200 mg by mouth every evening.   Yes Historical Provider, MD  OLANZapine (ZYPREXA) 5 MG tablet Take 5 mg by mouth every evening.   Yes Historical Provider, MD  ibuprofen (ADVIL,MOTRIN) 600 MG tablet Take 1 tablet (600 mg total) by mouth every 6 (six) hours as needed. 05/07/14   Loren Racer, MD  methocarbamol (ROBAXIN) 500 MG tablet Take 2 tablets (1,000 mg total) by mouth every 8 (eight) hours as needed for muscle spasms. 05/07/14   Loren Racer, MD  ondansetron (ZOFRAN ODT) 4 MG disintegrating tablet  ODT q4 hours prn nausea/vomit 05/07/14   Loren Racer, MD  oseltamivir (TAMIFLU) 75 MG capsule Take 1 capsule (75 mg total) by mouth every 12 (twelve) hours. 05/07/14   Loren Racer, MD   BP 112/72  Pulse 91  Temp(Src) 100.8 F (38.2 C) (Oral)  Resp 16  Ht  (1.626 m)  Wt 200 lb (90.719 kg)  BMI 34.31 kg/m2  SpO2 100%  LMP 05/02/2014 Physical Exam  Nursing note and vitals reviewed. Constitutional: She is oriented to person, place, and time. She appears well-developed and well-nourished. No distress.  Very well-appearing. Interactive, smiling.  HENT:  Head: Normocephalic and atraumatic.  Mouth/Throat: Oropharynx is clear and moist. No oropharyngeal exudate.  No sinus tenderness to percussion  Eyes: EOM are normal. Pupils are equal, round, and  reactive to light.  Neck: Normal range of motion. Neck supple.  No meningismus. Patient does have mild tenderness in the muscles in the left greater than right trapezius muscles.  Cardiovascular: Normal rate and regular rhythm.   Pulmonary/Chest: Effort normal and breath sounds normal. No respiratory distress. She has no wheezes. She has no rales. She exhibits no tenderness.  Abdominal: Soft. Bowel sounds  are normal. She exhibits no distension and no mass. There is no tenderness. There is no rebound and no guarding.  Musculoskeletal: Normal range of motion. She exhibits no edema and no tenderness.  No CVA tenderness bilaterally. No thoracic or lumbar midline tenderness. Patient with diffuse arthralgias especially in the bilateral wrist. There is no swelling, warmth, redness or deformity. Distal pulses intact.  Neurological: She is alert and oriented to person, place, and time.  Patient is alert and oriented x3 with clear, goal oriented speech. Patient has 5/5 motor in all extremities. Sensation is intact to light touch. Bilateral finger-to-nose is normal with no signs of dysmetria. Patient has a normal gait and walks without assistance.   Skin: Skin is warm and dry. No rash noted. No erythema.    ED Course  Procedures (including critical care time) Labs Review Labs Reviewed  COMPREHENSIVE METABOLIC PANEL - Abnormal; Notable for the following:    Sodium 136 (*)    Glucose, Bld 110 (*)    BUN 5 (*)    AST 167 (*)    ALT 113 (*)    Anion gap 16 (*)    All other components within normal limits  URINALYSIS, ROUTINE W REFLEX MICROSCOPIC - Abnormal; Notable for the following:    Hgb urine dipstick LARGE (*)    Leukocytes, UA SMALL (*)    All other components within normal limits  URINE MICROSCOPIC-ADD ON - Abnormal; Notable for the following:    Bacteria, UA FEW (*)    All other components within normal limits  RAPID STREP SCREEN  CULTURE, GROUP A STREP  PREGNANCY, URINE  MONONUCLEOSIS SCREEN  CBC WITH DIFFERENTIAL  CBC WITH DIFFERENTIAL    Imaging Review No results found.   EKG Interpretation None      MDM   Final diagnoses:  Flu-like symptoms    Patient with likely flulike illness. Doubt meningitis. Patient has no nuchal rigidity. She's very well-appearing.  Patient's pain is improved. Vital signs remained stable emergency department. Patient has mildly elevated liver  enzymes. This could be due to recent alcohol intake. She denies recent use of Tylenol. I doubt this is related to her presenting symptoms. She's been advised to followup with gastroenterologist for reevaluation. She's also been given very thorough return precautions and has voice understanding.  Loren Racer, MD 05/07/14 [phone]

## 2014-05-07 NOTE — Discharge Instructions (Signed)
Followup with your primary MD or with the gastroenterologist provided to have your liver enzymes reevaluated. Return immediately for worsening pain, increased weakness, persistent vomiting or for any concerns.   Influenza Influenza ("the flu") is a viral infection of the respiratory tract. It occurs more often in winter months because people spend more time in close contact with one another. Influenza can make you feel very sick. Influenza easily spreads from person to person (contagious). CAUSES  Influenza is caused by a virus that infects the respiratory tract. You can catch the virus by breathing in droplets from an infected person's cough or sneeze. You can also catch the virus by touching something that was recently contaminated with the virus and then touching your mouth, nose, or eyes. RISKS AND COMPLICATIONS You may be at risk for a more severe case of influenza if you smoke cigarettes, have diabetes, have chronic heart disease (such as heart failure) or lung disease (such as asthma), or if you have a weakened immune system. Elderly people and pregnant women are also at risk for more serious infections. The most common problem of influenza is a lung infection (pneumonia). Sometimes, this problem can require emergency medical care and may be life threatening. SIGNS AND SYMPTOMS  Symptoms typically last 4 to 10 days and may include:  Fever.  Chills.  Headache, body aches, and muscle aches.  Sore throat.  Chest discomfort and cough.  Poor appetite.  Weakness or feeling tired.  Dizziness.  Nausea or vomiting. DIAGNOSIS  Diagnosis of influenza is often made based on your history and a physical exam. A nose or throat swab test can be done to confirm the diagnosis. TREATMENT  In mild cases, influenza goes away on its own. Treatment is directed at relieving symptoms. For more severe cases, your health care provider may prescribe antiviral medicines to shorten the sickness. Antibiotic  medicines are not effective because the infection is caused by a virus, not by bacteria. HOME CARE INSTRUCTIONS  Take medicines only as directed by your health care provider.  Use a cool mist humidifier to make breathing easier.  Get plenty of rest until your temperature returns to normal. This usually takes 3 to 4 days.  Drink enough fluid to keep your urine clear or pale yellow.  Cover yourmouth and nosewhen coughing or sneezing,and wash your handswellto prevent thevirusfrom spreading.  Stay homefromwork orschool untilthe fever is gonefor at least 8full day. PREVENTION  An annual influenza vaccination (flu shot) is the best way to avoid getting influenza. An annual flu shot is now routinely recommended for all adults in the [place] SEEK MEDICAL CARE IF:  You experiencechest pain, yourcough worsens,or you producemore mucus.  Youhave nausea,vomiting, ordiarrhea.  Your fever returns or gets worse. SEEK IMMEDIATE MEDICAL CARE IF:  You havetrouble breathing, you become short of breath,or your skin ornails becomebluish.  You have severe painor stiffnessin the neck.  You develop a sudden headache, or pain in the face or ear.  You have nausea or vomiting that you cannot control. MAKE SURE YOU:   Understand these instructions.  Will watch your condition.  Will get help right away if you are not doing well or get worse. Document Released: 08/02/2000 Document Revised: 12/20/2013 Document Reviewed: 11/04/2011 [facility] Patient Information 2015 [place], [place]. This information is not intended to replace advice given to you by your health care provider. Make sure you discuss any questions you have with your health care provider.

## 2014-05-09 LAB — CULTURE, GROUP A STREP

## 2014-05-10 ENCOUNTER — Encounter (HOSPITAL_COMMUNITY): Payer: Self-pay | Admitting: Emergency Medicine

## 2014-05-10 ENCOUNTER — Emergency Department (HOSPITAL_COMMUNITY)
Admission: EM | Admit: 2014-05-10 | Discharge: 2014-05-10 | Disposition: A | Discharge status: Home / Self Care | Payer: Medicaid Other | Attending: Emergency Medicine | Admitting: Emergency Medicine

## 2014-05-10 DIAGNOSIS — M25571 Pain in right ankle and joints of right foot: Secondary | ICD-10-CM

## 2014-05-10 DIAGNOSIS — M25579 Pain in unspecified ankle and joints of unspecified foot: Secondary | ICD-10-CM | POA: Diagnosis not present

## 2014-05-10 DIAGNOSIS — R52 Pain, unspecified: Secondary | ICD-10-CM | POA: Diagnosis present

## 2014-05-10 DIAGNOSIS — E876 Hypokalemia: Secondary | ICD-10-CM

## 2014-05-10 DIAGNOSIS — F3289 Other specified depressive episodes: Secondary | ICD-10-CM | POA: Diagnosis not present

## 2014-05-10 DIAGNOSIS — F329 Major depressive disorder, single episode, unspecified: Secondary | ICD-10-CM | POA: Insufficient documentation

## 2014-05-10 DIAGNOSIS — M25572 Pain in left ankle and joints of left foot: Secondary | ICD-10-CM

## 2014-05-10 LAB — CBC WITH DIFFERENTIAL/PLATELET
BASOS ABS: 0 10*3/uL (ref 0.0–0.1)
Basophils Relative: 1 % (ref 0–1)
EOS PCT: 2 % (ref 0–5)
Eosinophils Absolute: 0.1 10*3/uL (ref 0.0–0.7)
HCT: 37 % (ref 36.0–46.0)
Hemoglobin: 12.3 g/dL (ref 12.0–15.0)
LYMPHS ABS: 1.2 10*3/uL (ref 0.7–4.0)
LYMPHS PCT: 27 % (ref 12–46)
MCH: 25 pg — ABNORMAL LOW (ref 26.0–34.0)
MCHC: 33.2 g/dL (ref 30.0–36.0)
MCV: 75.2 fL — AB (ref 78.0–100.0)
Monocytes Absolute: 0.1 10*3/uL (ref 0.1–1.0)
Monocytes Relative: 2 % — ABNORMAL LOW (ref 3–12)
NEUTROS ABS: 2.9 10*3/uL (ref 1.7–7.7)
Neutrophils Relative %: 68 % (ref 43–77)
Platelets: 312 10*3/uL (ref 150–400)
RBC: 4.92 MIL/uL (ref 3.87–5.11)
RDW: 13.7 % (ref 11.5–15.5)
WBC: 4.3 10*3/uL (ref 4.0–10.5)

## 2014-05-10 LAB — POTASSIUM: POTASSIUM: 3.6 meq/L — AB (ref 3.7–5.3)

## 2014-05-10 LAB — URINALYSIS, ROUTINE W REFLEX MICROSCOPIC
Glucose, UA: NEGATIVE mg/dL
KETONES UR: 15 mg/dL — AB
Leukocytes, UA: NEGATIVE
NITRITE: NEGATIVE
PH: 6 (ref 5.0–8.0)
Protein, ur: NEGATIVE mg/dL
Specific Gravity, Urine: 1.023 (ref 1.005–1.030)
Urobilinogen, UA: 0.2 mg/dL (ref 0.0–1.0)

## 2014-05-10 LAB — URINE MICROSCOPIC-ADD ON

## 2014-05-10 LAB — BASIC METABOLIC PANEL
Anion gap: 15 (ref 5–15)
BUN: 8 mg/dL (ref 6–23)
CHLORIDE: 99 meq/L (ref 96–112)
CO2: 25 meq/L (ref 19–32)
Calcium: 9.1 mg/dL (ref 8.4–10.5)
Creatinine, Ser: 0.45 mg/dL — ABNORMAL LOW (ref 0.50–1.10)
GFR calc Af Amer: >90 mL/min (ref >90)
GFR calc non Af Amer: >90 mL/min (ref >90)
GLUCOSE: 90 mg/dL (ref 70–99)
Potassium: 6 mEq/L — ABNORMAL HIGH (ref 3.7–5.3)
SODIUM: 139 meq/L (ref 137–147)

## 2014-05-10 LAB — CK: CK TOTAL: 153 U/L (ref 7–177)

## 2014-05-10 MED ORDER — SODIUM CHLORIDE 0.9 % IV BOLUS (SEPSIS): 1000.0000 mL | Freq: Once | INTRAVENOUS | Status: DC

## 2014-05-10 NOTE — ED Provider Notes (Signed)
CSN: [id number]     Arrival date & time 05/10/14  1337 History   First MD Initiated Contact with Patient 05/10/14 1638     Chief Complaint  Patient presents with  . Generalized Body Aches    HPI Pt presents for 5 days of generalized body aches. Seen in this ED on 9/19 for similar sx with addition of HA, fever and arthralgias. It was felt at that time she had a viral illness and that meningitis was very low on differential.   HA resolved.  She still notes pain to bilateral ankles and wrists. No sick contacts.  She denies tick exposure.  Denies cough, congestion, runny nose, neck stiffness.  Fever is now resolved, was febrile at prior visit.  Pain is mild in severity. Exacerbated by ambulation. She denies STI exposure, vaginal discharge, bleeding or rash. She has been taking Iburprofen with some intermittent relief.  No DVT hx. No hemoptysis. Denies chest pain. Labs at last visit were negative, however CBC was unable to be collected.   Past Medical History  Diagnosis Date  . Psychosis     per pt and family last year  . Depression    History reviewed. No pertinent past surgical history. No family history on file. History  Substance Use Topics  . Smoking status: Never Smoker   . Smokeless tobacco: Not on file  . Alcohol Use: No   OB History   Grav Para Term Preterm Abortions TAB SAB Ect Mult Living                 Review of Systems  Constitutional: Negative for fever and chills.  Respiratory: Negative for cough, shortness of breath and wheezing.   Cardiovascular: Negative for chest pain.  Gastrointestinal: Negative for nausea, vomiting and abdominal pain.  Musculoskeletal: Negative for back pain.  Skin: Negative for rash.  All other systems reviewed and are negative.   Allergies  Abilify  Home Medications   Prior to Admission medications   Medication Sig Start Date End Date Taking? Authorizing Provider  carbamazepine (TEGRETOL XR) 200 MG 12 hr tablet Take 200 mg by mouth  every evening.    Historical Provider, MD  ibuprofen (ADVIL,MOTRIN) 600 MG tablet Take 1 tablet (600 mg total) by mouth every 6 (six) hours as needed. 05/07/14   Loren Racer, MD  methocarbamol (ROBAXIN) 500 MG tablet Take 2 tablets (1,000 mg total) by mouth every 8 (eight) hours as needed for muscle spasms. 05/07/14   Loren Racer, MD  OLANZapine (ZYPREXA) 5 MG tablet Take 5 mg by mouth every evening.    Historical Provider, MD  ondansetron (ZOFRAN ODT) 4 MG disintegrating tablet  ODT q4 hours prn nausea/vomit 05/07/14   Loren Racer, MD   BP 98/69  Pulse 88  Temp(Src) 98.1 F (36.7 C) (Oral)  Resp 18  Ht  (1.626 m)  Wt 200 lb (90.719 kg)  BMI 34.31 kg/m2  SpO2 100%  LMP 05/02/2014 Physical Exam  Nursing note and vitals reviewed. Constitutional: She is oriented to person, place, and time. She appears well-developed and well-nourished. No distress.  HENT:  Head: Normocephalic and atraumatic.  Nose: Nose normal.  Mouth/Throat: Oropharynx is clear and moist. No oropharyngeal exudate.  Eyes: Conjunctivae are normal. Pupils are equal, round, and reactive to light.  Neck: Normal range of motion. Neck supple. No tracheal deviation present.  No rigidity. TTP at trapezius  Cardiovascular: Normal rate, regular rhythm, normal heart sounds and intact distal pulses.   No murmur  heard. Pulmonary/Chest: Effort normal and breath sounds normal. No respiratory distress. She has no rales.  Abdominal: Soft. Bowel sounds are normal. She exhibits no distension and no mass. There is no tenderness.  Musculoskeletal: Normal range of motion. She exhibits no edema (none appreciated).  No lower extremity edema, warmth, erythema or palpable cords. TTP at bilateral calves, wrists, forearms and ankles   Neurological: She is alert and oriented to person, place, and time.  Skin: Skin is warm and dry. No rash noted.  No rash noted  Psychiatric: She has a normal mood and affect.    ED Course   Procedures (including critical care time) Labs Review Labs Reviewed  CBC WITH DIFFERENTIAL - Abnormal; Notable for the following:    MCV 75.2 (*)    MCH 25.0 (*)    Monocytes Relative 2 (*)    All other components within normal limits  BASIC METABOLIC PANEL - Abnormal; Notable for the following:    Potassium 6.0 (*)    Creatinine, Ser 0.45 (*)    All other components within normal limits  URINALYSIS, ROUTINE W REFLEX MICROSCOPIC - Abnormal; Notable for the following:    APPearance HAZY (*)    Hgb urine dipstick TRACE (*)    Bilirubin Urine SMALL (*)    Ketones, ur 15 (*)    All other components within normal limits  POTASSIUM - Abnormal; Notable for the following:    Potassium 3.6 (*)    All other components within normal limits  URINE MICROSCOPIC-ADD ON - Abnormal; Notable for the following:    Squamous Epithelial / LPF FEW (*)    Bacteria, UA FEW (*)    All other components within normal limits  CK    Imaging Review No results found.   EKG Interpretation None      MDM   Final diagnoses:  Arthralgia of both ankles  Hypokalemia    Young female presents for genaralized myalgias and arthralgias.  Seen recently and felt to have viral illness. I agree this is most likely.  Pt is well appearing.  Sx have been improving but arthralgias have persisted.  No STI exposure/ vaginal discharge. No evidence of DVT.  No concern for septic arthritis, FROM without warmth.  No nuchal rigidity.  No headache.  No emergent condition identified on exam. Can not rule out early presentation for rheumatologic disorder but less likely.  Discussed NSAID use.  Discussed expected time course of a viral illness and to seek further evaluation by PCP if sx persist.      Sofie Rower, MD 05/11/14 0000

## 2014-05-10 NOTE — ED Notes (Signed)
Jody RN collected blood. Unable to maintain IV access.

## 2014-05-10 NOTE — ED Notes (Signed)
Md at bedside

## 2014-05-10 NOTE — Discharge Instructions (Signed)

## 2014-05-10 NOTE — ED Notes (Signed)
Pt reports pain to bilateral wrist and bilateral ankles x 5 days. States she was seen at [facility] on Friday for fever, body aches and HA. Pt denies fever or HA since Friday but states "I just am still really tender to my wrists and ankles." Pt denies any pain while sitting. Denies any recent injury. Denies generalized weakness. Pt in NAD.

## 2014-05-14 NOTE — ED Provider Notes (Signed)
I saw and evaluated the patient, reviewed the resident's note and I agree with the findings and plan.  Pt c/o body and joint aches. Improving from prior but not resolved. No focal joint swelling, pain or redness. No fever. Chest cta. abd soft nt. No rash.    Suzi Roots, MD 05/14/14 2118

## 2015-09-18 ENCOUNTER — Ambulatory Visit (HOSPITAL_BASED_OUTPATIENT_CLINIC_OR_DEPARTMENT_OTHER): Payer: Medicaid Other | Attending: Internal Medicine | Admitting: Radiology

## 2015-09-18 VITALS — Ht 64.0 in | Wt 218.0 lb

## 2015-09-18 DIAGNOSIS — I493 Ventricular premature depolarization: Secondary | ICD-10-CM | POA: Diagnosis not present

## 2015-09-18 DIAGNOSIS — R0683 Snoring: Secondary | ICD-10-CM | POA: Insufficient documentation

## 2015-09-18 DIAGNOSIS — G4733 Obstructive sleep apnea (adult) (pediatric): Secondary | ICD-10-CM | POA: Diagnosis present

## 2015-09-18 DIAGNOSIS — G4763 Sleep related bruxism: Secondary | ICD-10-CM | POA: Diagnosis not present

## 2015-09-18 DIAGNOSIS — R5383 Other fatigue: Secondary | ICD-10-CM | POA: Diagnosis not present

## 2015-09-23 DIAGNOSIS — G4733 Obstructive sleep apnea (adult) (pediatric): Secondary | ICD-10-CM | POA: Diagnosis not present

## 2015-09-23 NOTE — Progress Notes (Signed)
  Patient Name: Lorraine Bryant, Gandolfi Date: 09/18/2015 Gender: Female D.O.B: May 26, 1986 Age (years): 29 Referring Provider: Amada Kingfisher Bonsu Height (inches): 64 Interpreting Physician: Jetty Duhamel MD, ABSM Weight (lbs): 218 RPSGT: Ulyess Mort BMI: 37 MRN: [id number] Neck Size: 14.00 CLINICAL INFORMATION Sleep Study Type: NPSG Indication for sleep study: Fatigue Epworth Sleepiness Score: 1  SLEEP STUDY TECHNIQUE As per the AASM Manual for the Scoring of Sleep and Associated Events v2.3 (April 2016) with a hypopnea requiring 4% desaturations. The channels recorded and monitored were frontal, central and occipital EEG, electrooculogram (EOG), submentalis EMG (chin), nasal and oral airflow, thoracic and abdominal wall motion, anterior tibialis EMG, snore microphone, electrocardiogram, and pulse oximetry.  MEDICATIONS Patient's medications include: charted for review. Medications self-administered by patient during sleep study : No sleep medicine administered.  SLEEP ARCHITECTURE The study was initiated at 11:26:39 PM and ended at 5:37:28 AM. Sleep onset time was 40.5 minutes and the sleep efficiency was 79.6%. The total sleep time was 295.0 minutes. Stage REM latency was 92.5 minutes. The patient spent 14.24% of the night in stage N1 sleep, 76.10% in stage N2 sleep, 0.17% in stage N3 and 9.49% in REM. Alpha intrusion was absent. Supine sleep was 13.90%. Wake after sleep onset 35.4 minutes  RESPIRATORY PARAMETERS The overall apnea/hypopnea index (AHI) was 0.8 per hour. There were 3 total apneas, including 2 obstructive, 1 central and 0 mixed apneas. There were 1 hypopneas and 3 RERAs. The AHI during Stage REM sleep was 4.3 per hour. AHI while supine was 0.0 per hour. The mean oxygen saturation was 97.59%. The minimum SpO2 during sleep was 93.00%. Soft snoring was noted during this study.  CARDIAC DATA The 2 lead EKG demonstrated sinus rhythm. The mean heart rate was  72.44 beats per minute. Other EKG findings include: PVCs.  LEG MOVEMENT DATA The total PLMS were 17 with a resulting PLMS index of 3.46. Associated arousal with leg movement index was 0.6 .  IMPRESSIONS - No significant obstructive sleep apnea occurred during this study (AHI = 0.8/h). - No significant central sleep apnea occurred during this study (CAI = 0.2/h). - The patient had minimal or no oxygen desaturation during the study (Min O2 = 93.00%) - The patient snored with Soft snoring volume. - No cardiac abnormalities were noted during this study. - Clinically significant periodic limb movements did not occur during sleep. No significant associated arousals. - Bruxism was noted  DIAGNOSIS - Normal study  RECOMMENDATIONS - Avoid alcohol, sedatives and other CNS depressants that may worsen sleep apnea and disrupt normal sleep architecture. - Sleep hygiene should be reviewed to assess factors that may improve sleep quality. - Weight management and regular exercise should be initiated or continued if appropriate. - Dental evaluation for bruxism  Waymon Budge Diplomate, American Board of Sleep Medicine  ELECTRONICALLY SIGNED ON:  09/23/2015, 10:04 AM [facility] PH: [phone]   FX: [phone] ACCREDITED BY THE AMERICAN ACADEMY OF SLEEP MEDICINE

## 2017-06-25 ENCOUNTER — Encounter (HOSPITAL_COMMUNITY): Payer: Self-pay | Admitting: *Deleted

## 2017-06-25 ENCOUNTER — Inpatient Hospital Stay (HOSPITAL_COMMUNITY)
Admission: AD | Admit: 2017-06-25 | Discharge: 2017-06-25 | Disposition: A | Discharge status: Home / Self Care | Payer: Medicaid Other | Source: Ambulatory Visit | Attending: Obstetrics & Gynecology | Admitting: Obstetrics & Gynecology

## 2017-06-25 DIAGNOSIS — N920 Excessive and frequent menstruation with regular cycle: Secondary | ICD-10-CM

## 2017-06-25 DIAGNOSIS — Z3202 Encounter for pregnancy test, result negative: Secondary | ICD-10-CM | POA: Insufficient documentation

## 2017-06-25 DIAGNOSIS — N939 Abnormal uterine and vaginal bleeding, unspecified: Secondary | ICD-10-CM | POA: Insufficient documentation

## 2017-06-25 DIAGNOSIS — F419 Anxiety disorder, unspecified: Secondary | ICD-10-CM | POA: Insufficient documentation

## 2017-06-25 DIAGNOSIS — F329 Major depressive disorder, single episode, unspecified: Secondary | ICD-10-CM | POA: Diagnosis not present

## 2017-06-25 DIAGNOSIS — F29 Unspecified psychosis not due to a substance or known physiological condition: Secondary | ICD-10-CM | POA: Insufficient documentation

## 2017-06-25 LAB — URINALYSIS, ROUTINE W REFLEX MICROSCOPIC
BILIRUBIN URINE: NEGATIVE
Bacteria, UA: NONE SEEN
GLUCOSE, UA: NEGATIVE mg/dL
Ketones, ur: NEGATIVE mg/dL
LEUKOCYTES UA: NEGATIVE
NITRITE: NEGATIVE
Protein, ur: NEGATIVE mg/dL
SPECIFIC GRAVITY, URINE: 1.023 (ref 1.005–1.030)
pH: 6 (ref 5.0–8.0)

## 2017-06-25 LAB — HCG, SERUM, QUALITATIVE: Preg, Serum: NEGATIVE

## 2017-06-25 LAB — POCT PREGNANCY, URINE: Preg Test, Ur: NEGATIVE

## 2017-06-25 NOTE — MAU Provider Note (Signed)
History     CSN: [id number]  Arrival date and time: 06/25/17 1305   None     Chief Complaint  Patient presents with  . Vaginal Bleeding   HPI    Lorraine Bryant is a 31 y.o. female G1 here in MAU with complaints of vaginal bleeding, breast leaking and elevated prolactin level. States she was seen by her PCP and had a prolactin level of greater than 200.   Taking medication for anxiety, depression, and psychosis currently. Breast leaking occurs in both breasts when she manipulates the nipples.   Concerned because she has not had a regular period this year at all, but has been spotting for a few months. Last month she had no period at all. She has some months where she has no period and some months she spots. This episode of spotting started today.   The patient is not sexually active at all.    Medications Prior to Admission  Medication Sig Dispense Refill Last Dose  . carbamazepine (TEGRETOL XR) 200 MG 12 hr tablet Take 200 mg by mouth every evening.   06/24/2017 at Unknown time  . LATUDA 40 MG TABS tablet TAKE ONE HALF TABLET AT DINNER  1 06/24/2017 at Unknown time  . INVEGA SUSTENNA 156 MG/ML SUSP injection INJECT 156 MG INTRAMUSCULARLY EVERY 28 DAYS FOR MOOD  1 05/30/2017  . OLANZapine (ZYPREXA) 5 MG tablet Take 5 mg every evening by mouth. This medication was stopped at the end of October   06/11/2017    OB History    No data available      Past Medical History:  Diagnosis Date  . Depression   . Psychosis (HCC)    per pt and family last year    No past surgical history on file.  No family history on file.  Social History   Tobacco Use  . Smoking status: Never Smoker  Substance Use Topics  . Alcohol use: No  . Drug use: No    Comment: unable to assess d/t pt not responding to questioning    Allergies:  Allergies  Allergen Reactions  . Abilify [Aripiprazole] Other (See Comments)    Slow moving and hand cramping up    Medications Prior to  Admission  Medication Sig Dispense Refill Last Dose  . carbamazepine (TEGRETOL XR) 200 MG 12 hr tablet Take 200 mg by mouth every evening.   06/24/2017 at Unknown time  . LATUDA 40 MG TABS tablet TAKE ONE HALF TABLET AT DINNER  1 06/24/2017 at Unknown time  . INVEGA SUSTENNA 156 MG/ML SUSP injection INJECT 156 MG INTRAMUSCULARLY EVERY 28 DAYS FOR MOOD  1 05/30/2017  . OLANZapine (ZYPREXA) 5 MG tablet Take 5 mg every evening by mouth. This medication was stopped at the end of October   06/11/2017   Results for orders placed or performed during the hospital encounter of 06/25/17 (from the past 48 hour(s))  Urinalysis, Routine w reflex microscopic     Status: Abnormal   Collection Time: 06/25/17  1:39 PM  Result Value Ref Range   Color, Urine YELLOW YELLOW   APPearance CLEAR CLEAR   Specific Gravity, Urine 1.023 1.005 - 1.030   pH 6.0 5.0 - 8.0   Glucose, UA NEGATIVE NEGATIVE mg/dL   Hgb urine dipstick LARGE (A) NEGATIVE   Bilirubin Urine NEGATIVE NEGATIVE   Ketones, ur NEGATIVE NEGATIVE mg/dL   Protein, ur NEGATIVE NEGATIVE mg/dL   Nitrite NEGATIVE NEGATIVE   Leukocytes, UA NEGATIVE NEGATIVE  RBC / HPF TOO NUMEROUS TO COUNT 0 - 5 RBC/hpf   WBC, UA 6-30 0 - 5 WBC/hpf   Bacteria, UA NONE SEEN NONE SEEN   Squamous Epithelial / LPF 0-5 (A) NONE SEEN   Mucus PRESENT   Pregnancy, urine POC     Status: None   Collection Time: 06/25/17  1:54 PM  Result Value Ref Range   Preg Test, Ur NEGATIVE NEGATIVE    Comment:        THE SENSITIVITY OF THIS METHODOLOGY IS >24 mIU/mL   hCG, serum, qualitative     Status: None   Collection Time: 06/25/17  3:40 PM  Result Value Ref Range   Preg, Serum NEGATIVE NEGATIVE    Comment:        THE SENSITIVITY OF THIS METHODOLOGY IS >10 mIU/mL.     Review of Systems  Constitutional: Negative for fever.  Gastrointestinal: Negative for abdominal pain.  Genitourinary: Positive for vaginal bleeding. Negative for dysuria.   Physical Exam   Blood pressure  114/67, pulse 60, temperature 98.5 F (36.9 C), temperature source Oral, resp. rate 16, height 5\' 3"  (1.6 m), weight 104.8 kg (231 lb), SpO2 100 %.  Physical Exam  Constitutional: She is oriented to person, place, and time. She appears well-developed and well-nourished. No distress.  Respiratory: Effort normal.  GI: Soft. She exhibits no distension. There is no tenderness. There is no rebound and no guarding.  Musculoskeletal: Normal range of motion.  Neurological: She is alert and oriented to person, place, and time.  Skin: Skin is warm. She is not diaphoretic.  Psychiatric: Her behavior is normal.    MAU Course  Procedures  None  MDM  Hcg qualitative negative.  Assessment and Plan   A:  1. Spotting   2. Encounter for pregnancy test, result negative   3. Abnormal vaginal bleeding     P:  Discharge home with bleeding precautions Patient needs GYN visit; Patient called and made an appointment for [facility] Pelvic US ordered Stop manipulating breasts Return to MAU if symptoms worsen

## 2017-06-25 NOTE — MAU Note (Signed)
Pt reports she has been having spotting, no period last month.

## 2017-06-25 NOTE — Discharge Instructions (Signed)
Abnormal Uterine Bleeding Abnormal uterine bleeding means bleeding more than usual from your uterus. It can include:  Bleeding between periods.  Bleeding after sex.  Bleeding that is heavier than normal.  Periods that last longer than usual.  Bleeding after you have stopped having your period (menopause).  There are many problems that may cause this. You should see a doctor for any kind of bleeding that is not normal. Treatment depends on the cause of the bleeding. Follow these instructions at home:  Watch your condition for any changes.  Do not use tampons, douche, or have sex, if your doctor tells you not to.  Change your pads often.  Get regular well-woman exams. Make sure they include a pelvic exam and cervical cancer screening.  Keep all follow-up visits as told by your doctor. This is important. Contact a doctor if:  The bleeding lasts more than one week.  You feel dizzy at times.  You feel like you are going to throw up (nauseous).  You throw up. Get help right away if:  You pass out.  You have to change pads every hour.  You have belly (abdominal) pain.  You have a fever.  You get sweaty.  You get weak.  You passing large blood clots from your vagina. Summary  Abnormal uterine bleeding means bleeding more than usual from your uterus.  There are many problems that may cause this. You should see a doctor for any kind of bleeding that is not normal.  Treatment depends on the cause of the bleeding. This information is not intended to replace advice given to you by your health care provider. Make sure you discuss any questions you have with your health care provider. Document Released: 06/02/2009 Document Revised: 07/30/2016 Document Reviewed: 07/30/2016 Elsevier Interactive Patient Education  2017 Elsevier Inc. Prolactin Level Test Why am I having this test? Prolactin is a hormone that is produced by the pituitary gland. This test is often used to  diagnose and monitor disease problems in the pituitary gland. Prolactin levels go up and down (fluctuate) due to stress, illness, trauma, or surgery. Increased levels are commonly associated with an absent menstrual cycle (amenorrhea) and tumors. What kind of sample is taken? A blood sample is required for this test. It is usually collected by inserting a needle into a vein. How do I prepare for this test? There is no preparation required for this test. What are the reference ranges? Reference ranges are considered healthy ranges established after testing a large group of healthy people. Reference ranges may vary among different people, labs, and hospitals. It is your responsibility to obtain your test results. Ask the lab or department performing the test when and how you will get your results.  Adult female: less than 20 ng/mL.  Adult female: less than 28 ng/mL.  Pregnant female: ? Trimester 1: less than 80 ng/mL. ? Trimester 2: less than 160 ng/mL. ? Trimester 3: less than 400 ng/mL.  What do the results mean? Increased levels of prolactin may indicate:  A pituitary gland tumor.  Amenorrhea.  Hypothyroidism.  Certain pituitary or reproductive syndromes.  Kidney failure.  Decreased levels of prolactin may indicate:  Lack of blood to the pituitary gland.  Pituitary gland failure.  Talk with your health care provider to discuss your results, treatment options, and if necessary, the need for more tests. Talk with your health care provider if you have any questions about your results. Talk with your health care provider to discuss your  results, treatment options, and if necessary, the need for more tests. Talk with your health care provider if you have any questions about your results. This information is not intended to replace advice given to you by your health care provider. Make sure you discuss any questions you have with your health care provider. Document Released:  09/07/2004 Document Revised: 04/10/2016 Document Reviewed: 01/28/2014 Elsevier Interactive Patient Education  [employer]2018 Elsevier Inc.

## 2017-07-02 ENCOUNTER — Ambulatory Visit (HOSPITAL_COMMUNITY)
Admission: RE | Admit: 2017-07-02 | Discharge: 2017-07-02 | Disposition: A | Discharge status: Home / Self Care | Payer: Medicaid Other | Source: Ambulatory Visit | Attending: Obstetrics and Gynecology | Admitting: Obstetrics and Gynecology

## 2017-07-02 ENCOUNTER — Ambulatory Visit: Payer: Medicaid Other | Admitting: Obstetrics & Gynecology

## 2017-07-02 ENCOUNTER — Other Ambulatory Visit (HOSPITAL_COMMUNITY)
Admission: RE | Admit: 2017-07-02 | Discharge: 2017-07-02 | Disposition: A | Discharge status: Home / Self Care | Payer: Medicaid Other | Source: Ambulatory Visit | Attending: Obstetrics & Gynecology | Admitting: Obstetrics & Gynecology

## 2017-07-02 VITALS — BP 113/74 | HR 75 | Wt 233.5 lb

## 2017-07-02 DIAGNOSIS — Z23 Encounter for immunization: Secondary | ICD-10-CM | POA: Diagnosis not present

## 2017-07-02 DIAGNOSIS — N938 Other specified abnormal uterine and vaginal bleeding: Secondary | ICD-10-CM

## 2017-07-02 DIAGNOSIS — N920 Excessive and frequent menstruation with regular cycle: Secondary | ICD-10-CM | POA: Diagnosis present

## 2017-07-02 DIAGNOSIS — R7989 Other specified abnormal findings of blood chemistry: Secondary | ICD-10-CM

## 2017-07-02 DIAGNOSIS — E229 Hyperfunction of pituitary gland, unspecified: Principal | ICD-10-CM

## 2017-07-02 DIAGNOSIS — Z0000 Encounter for general adult medical examination without abnormal findings: Secondary | ICD-10-CM

## 2017-07-02 MED ORDER — NORELGESTROMIN-ETH ESTRADIOL 150-35 MCG/24HR TD PTWK: 1.0000 | MEDICATED_PATCH | TRANSDERMAL | 12 refills | Status: DC

## 2017-07-02 NOTE — Progress Notes (Signed)
Patient ID: Lorraine Bryant, female   DOB: 05/10/1986, 31 y.o.   MRN: [id number]  Chief Complaint  Patient presents with  . Results    HPI Lorraine Bryant is a 31 y.o. female. S AA P0 here with her mom with the issue of irregular bleeding and elevated prolactin (211). This issue is being followed by her psychiatrist who manages her psychosis. Her periods are irregular, spotting in between. Pt previously used patch while in high school and tolerated it well. Not currently on any contraceptions and not sexually active. Pt denies abd pain, vaginal discharge, dysuria. TSH was normal recently. HPI  Past Medical History:  Diagnosis Date  . Depression   . Psychosis (HCC)    per pt and family last year    Past Surgical History:  Procedure Laterality Date  . NO PAST SURGERIES      No family history on file.  Social History Social History   Tobacco Use  . Smoking status: Never Smoker  . Smokeless tobacco: Never Used  Substance Use Topics  . Alcohol use: No  . Drug use: No    Comment: unable to assess d/t pt not responding to questioning    Allergies  Allergen Reactions  . Abilify [Aripiprazole] Other (See Comments)    Slow moving and hand cramping up    Current Outpatient Medications  Medication Sig Dispense Refill  . carbamazepine (TEGRETOL XR) 200 MG 12 hr tablet Take 200 mg by mouth every evening.    . INVEGA SUSTENNA 156 MG/ML SUSP injection INJECT 156 MG INTRAMUSCULARLY EVERY 28 DAYS FOR MOOD  1  . LATUDA 40 MG TABS tablet TAKE ONE HALF TABLET AT DINNER  1  . OLANZapine (ZYPREXA) 5 MG tablet Take 5 mg every evening by mouth. This medication was stopped at the end of October     No current facility-administered medications for this visit.     Review of Systems Review of Systems  She reports a normal pap with her primary care doctor last year.  Blood pressure 113/74, pulse 75, weight 233 lb 8 oz (105.9 kg).  Physical Exam Physical Exam Breathing, conversing, and  ambulating normally  Data Reviewed Preliminary results of u/s today are normal.  Assessment    Irregular bleeding- may be due to some degree of PCOS, possibly related to her elevated prolactin.    Plan    Check cervical cultures to rule this out as a cause Rec regulate her periods with ortho Evra She is requesting a referral to endocrinology She will start the Gardasil series today       Allie BossierMyra C Cesar Alf 07/02/2017, 3:14 PM

## 2017-07-02 NOTE — Progress Notes (Signed)
Pt reports getting pap smear from primary care physician.  Contacted [facility] for referral.  Was advised that there office will speak with provider to see if they will accept the pt.  Pt informed [place] advisement and that we call her about decision on appt. Pt stated understanding with no further questions.

## 2017-07-03 LAB — GC/CHLAMYDIA PROBE AMP (~~LOC~~) NOT AT ARMC
Chlamydia: NEGATIVE
Neisseria Gonorrhea: NEGATIVE

## 2017-07-04 ENCOUNTER — Telehealth: Payer: Self-pay

## 2017-07-04 NOTE — Telephone Encounter (Signed)
Scheduled pt referral to [facility] @ [phone], [address], on November 21st @ 1140.  LM for pt to return call to inform her about appt.  Notes to be [facility] to [facility] via front office.

## 2017-07-07 NOTE — Telephone Encounter (Signed)
Attempted x 2 to contact pt in regards to Endocrinology appt.  Notified [facility] that I was unable to to contact pt and when I do I will call them reschedule the appt. Letter sent to pt.

## 2017-07-15 ENCOUNTER — Encounter (HOSPITAL_COMMUNITY): Payer: Self-pay

## 2017-07-15 ENCOUNTER — Emergency Department (HOSPITAL_COMMUNITY)
Admission: EM | Admit: 2017-07-15 | Discharge: 2017-07-16 | Disposition: A | Discharge status: Other Acute Inpatient Hospital | Payer: Medicaid Other | Attending: Emergency Medicine | Admitting: Emergency Medicine

## 2017-07-15 ENCOUNTER — Other Ambulatory Visit: Payer: Self-pay

## 2017-07-15 DIAGNOSIS — F29 Unspecified psychosis not due to a substance or known physiological condition: Secondary | ICD-10-CM | POA: Insufficient documentation

## 2017-07-15 DIAGNOSIS — F200 Paranoid schizophrenia: Secondary | ICD-10-CM

## 2017-07-15 DIAGNOSIS — R4589 Other symptoms and signs involving emotional state: Secondary | ICD-10-CM | POA: Insufficient documentation

## 2017-07-15 DIAGNOSIS — F23 Brief psychotic disorder: Secondary | ICD-10-CM

## 2017-07-15 DIAGNOSIS — R440 Auditory hallucinations: Secondary | ICD-10-CM

## 2017-07-15 DIAGNOSIS — Z046 Encounter for general psychiatric examination, requested by authority: Secondary | ICD-10-CM | POA: Diagnosis present

## 2017-07-15 DIAGNOSIS — F22 Delusional disorders: Secondary | ICD-10-CM | POA: Diagnosis not present

## 2017-07-15 DIAGNOSIS — Z79899 Other long term (current) drug therapy: Secondary | ICD-10-CM | POA: Diagnosis not present

## 2017-07-15 DIAGNOSIS — R4689 Other symptoms and signs involving appearance and behavior: Secondary | ICD-10-CM

## 2017-07-15 HISTORY — DX: Paranoid schizophrenia: F20.0

## 2017-07-15 HISTORY — DX: Hallucinations, unspecified: R44.3

## 2017-07-15 LAB — COMPREHENSIVE METABOLIC PANEL
ALBUMIN: 3.8 g/dL (ref 3.5–5.0)
ALK PHOS: 65 U/L (ref 38–126)
ALT: 46 U/L (ref 14–54)
ANION GAP: 6 (ref 5–15)
AST: 57 U/L — ABNORMAL HIGH (ref 15–41)
BILIRUBIN TOTAL: 0.1 mg/dL — AB (ref 0.3–1.2)
BUN: 9 mg/dL (ref 6–20)
CO2: 26 mmol/L (ref 22–32)
CREATININE: 0.59 mg/dL (ref 0.44–1.00)
Calcium: 9.1 mg/dL (ref 8.9–10.3)
Chloride: 105 mmol/L (ref 101–111)
GFR calc non Af Amer: >60 mL/min (ref >60)
GLUCOSE: 86 mg/dL (ref 65–99)
Potassium: 3.6 mmol/L (ref 3.5–5.1)
Sodium: 137 mmol/L (ref 135–145)
TOTAL PROTEIN: 7.2 g/dL (ref 6.5–8.1)

## 2017-07-15 LAB — CBC
HEMATOCRIT: 37.4 % (ref 36.0–46.0)
HEMOGLOBIN: 12.1 g/dL (ref 12.0–15.0)
MCH: 24.8 pg — AB (ref 26.0–34.0)
MCHC: 32.4 g/dL (ref 30.0–36.0)
MCV: 76.6 fL — ABNORMAL LOW (ref 78.0–100.0)
Platelets: 335 10*3/uL (ref 150–400)
RBC: 4.88 MIL/uL (ref 3.87–5.11)
RDW: 14.2 % (ref 11.5–15.5)
WBC: 7.3 10*3/uL (ref 4.0–10.5)

## 2017-07-15 LAB — SALICYLATE LEVEL: Salicylate Lvl: <7 mg/dL (ref 2.8–30.0)

## 2017-07-15 LAB — RAPID URINE DRUG SCREEN, HOSP PERFORMED
Amphetamines: NOT DETECTED
BARBITURATES: NOT DETECTED
Benzodiazepines: NOT DETECTED
COCAINE: NOT DETECTED
Opiates: NOT DETECTED
TETRAHYDROCANNABINOL: NOT DETECTED

## 2017-07-15 LAB — ETHANOL: Alcohol, Ethyl (B): <10 mg/dL (ref <10)

## 2017-07-15 LAB — ACETAMINOPHEN LEVEL

## 2017-07-15 MED ORDER — LORAZEPAM 1 MG PO TABS
2.0000 mg | ORAL_TABLET | Freq: Four times a day (QID) | ORAL | Status: DC | PRN
  Administered 2017-07-15: 2 mg via ORAL
  Filled 2017-07-15: qty 2

## 2017-07-15 MED ORDER — CARBAMAZEPINE ER 200 MG PO TB12
200.0000 mg | ORAL_TABLET | Freq: Two times a day (BID) | ORAL | Status: DC
  Administered 2017-07-15 (×2): 200 mg via ORAL
  Filled 2017-07-15 (×2): qty 1

## 2017-07-15 MED ORDER — LORAZEPAM 2 MG/ML IJ SOLN: 2.0000 mg | Freq: Once | INTRAMUSCULAR | Status: DC

## 2017-07-15 MED ORDER — ZIPRASIDONE MESYLATE 20 MG IM SOLR
20.0000 mg | Freq: Once | INTRAMUSCULAR | Status: AC | PRN
  Administered 2017-07-15: 20 mg via INTRAMUSCULAR
  Filled 2017-07-15: qty 20

## 2017-07-15 MED ORDER — TRAZODONE HCL 100 MG PO TABS: 100.0000 mg | ORAL_TABLET | Freq: Every evening | ORAL | Status: DC | PRN

## 2017-07-15 MED ORDER — STERILE WATER FOR INJECTION IJ SOLN
INTRAMUSCULAR | Status: AC
  Administered 2017-07-15: 15:00:00
  Filled 2017-07-15: qty 10

## 2017-07-15 MED ORDER — CARBAMAZEPINE ER 200 MG PO TB12: 200.0000 mg | ORAL_TABLET | Freq: Every evening | ORAL | Status: DC

## 2017-07-15 MED ORDER — DIPHENHYDRAMINE HCL 50 MG/ML IJ SOLN
50.0000 mg | Freq: Once | INTRAMUSCULAR | Status: AC | PRN
  Administered 2017-07-15: 50 mg via INTRAMUSCULAR
  Filled 2017-07-15: qty 1

## 2017-07-15 MED ORDER — LORAZEPAM 2 MG/ML IJ SOLN
2.0000 mg | Freq: Once | INTRAMUSCULAR | Status: AC | PRN
  Administered 2017-07-15: 2 mg via INTRAMUSCULAR
  Filled 2017-07-15: qty 1

## 2017-07-15 MED ORDER — LURASIDONE HCL 40 MG PO TABS
80.0000 mg | ORAL_TABLET | Freq: Every day | ORAL | Status: DC
  Administered 2017-07-15: 80 mg via ORAL
  Filled 2017-07-15: qty 2

## 2017-07-15 NOTE — BH Assessment (Signed)
Assessment Note  Lorraine Bryant is an 31 y.o. female. Pt under IVC by mother, seen by [facility]/Dr Jannifer FranklinAkintayo for med management.  All information obtained from mother as pt refused to talk to TTS and then was sedated after becoming agitated.  Pt was doing well on medication regimen that included zyprexa but this was changed due to weight gain.  There have been significant problems since the change in meds.  Family noticed over thanksgiving that pt was talking to herself and since then pt has been laughing inappropriately, cursing, getting angry.  All of these are unusual behaviors for her.  Pt currently has been talking as if she is still involved with an old boyfriend who broke up with her 5 years ago.  Several medication changes have been attempted on an outpt basis without success.  Pt does not appear to have SI/HI.  Pt is having auditory, possibly visual hallucinations, and delusions about the old boyfriend.  No substance use reported.  Diagnosis: paranoid schizophrenia  Past Medical History:  Past Medical History:  Diagnosis Date  . Depression   . Hallucinations   . Psychosis (HCC)    per pt and family last year    Past Surgical History:  Procedure Laterality Date  . NO PAST SURGERIES      Family History: History reviewed. No pertinent family history.  Social History:  reports that  has never smoked. she has never used smokeless tobacco. She reports that she does not drink alcohol or use drugs.  Additional Social History:  Alcohol / Drug Use Pain Medications: No substance use reported History of alcohol / drug use?: No history of alcohol / drug abuse  CIWA: CIWA-Ar BP: (!) 114/96 Pulse Rate: (!) 104 COWS:    Allergies:  Allergies  Allergen Reactions  . Abilify [Aripiprazole] Other (See Comments)    Slow moving and hand cramping up    Home Medications:  (Not in a hospital admission)  OB/GYN Status:  Patient's last menstrual period was  07/08/2017.  General Assessment Data Assessment unable to be completed: Yes Reason for not completing assessment: Pt reported psychotic, refused to participate in assessment. [facility]: [facility] TTS Assessment: In system Is this a Tele or Face-to-Face Assessment?: Face-to-Face Is this an Initial Assessment or a Re-assessment for this encounter?: Initial Assessment Marital status: Single Is patient pregnant?: No Pregnancy Status: No Living Arrangements: Parent Can pt return to current living arrangement?: Yes Admission Status: Involuntary Is patient capable of signing voluntary admission?: No Referral Source: Self/Family/Friend     Crisis Care Plan Living Arrangements: Parent Legal Guardian: (na) Name of Psychiatrist: Dr Jannifer FranklinAkintayo Name of Therapist: none  Education Status Is patient currently in school?: Yes Current Grade: graduate school online  Risk to self with the past 6 months Suicidal Ideation: No Has patient been a risk to self within the past 6 months prior to admission? : No Suicidal Intent: No Has patient had any suicidal intent within the past 6 months prior to admission? : No Is patient at risk for suicide?: No Suicidal Plan?: No Has patient had any suicidal plan within the past 6 months prior to admission? : No Access to Means: No What has been your use of drugs/alcohol within the last 12 months?: none reported Previous Attempts/Gestures: No Intentional Self Injurious Behavior: None Family Suicide History: No Recent stressful life event(s): (none reported) Persecutory voices/beliefs?: No Depression: No Substance abuse history and/or treatment for substance abuse?: No  Risk to Others within the past  6 months Homicidal Ideation: No Does patient have any lifetime risk of violence toward others beyond the six months prior to admission? : No Thoughts of Harm to Others: No Current Homicidal Intent: No Current Homicidal Plan: No Access to Homicidal  Means: No History of harm to others?: No Assessment of Violence: None Noted Does patient have access to weapons?: No Criminal Charges Pending?: No Does patient have a court date: No Is patient on probation?: No  Psychosis Hallucinations: Auditory Delusions: (pt believes she is still involved with old boyfriend)  Mental Status Report Appearance/Hygiene: Disheveled Eye Contact: Poor Motor Activity: Agitation Speech: Aggressive, Loud Level of Consciousness: Combative, Sedated Mood: Labile Affect: Labile Anxiety Level: None Thought Processes: Irrelevant Judgement: Impaired Orientation: Unable to assess Obsessive Compulsive Thoughts/Behaviors: None  Cognitive Functioning Concentration: Unable to Assess Memory: Unable to Assess IQ: Average Insight: Poor Impulse Control: Poor Appetite: Fair Weight Loss: 0 Weight Gain: (pt has gained significant weight on past year: zyprexa) Sleep: Decreased Total Hours of Sleep: (unsure) Vegetative Symptoms: None  ADLScreening [facility] Assessment Services) Patient's cognitive ability adequate to safely complete daily activities?: Yes Patient able to express need for assistance with ADLs?: Yes Independently performs ADLs?: Yes (appropriate for developmental age)  Prior Inpatient Therapy Prior Inpatient Therapy: Yes Prior Therapy Dates: 2012. 2015 [facility] Reason for Treatment: psych  Prior Outpatient Therapy Prior Outpatient Therapy: Yes Prior Therapy Dates: current Prior Therapy Facilty/Provider(s): Dr Jannifer Franklinakintayo Reason for Treatment: psych/med management Does patient have an ACCT team?: No Does patient have Intensive In-House Services?  : No Does patient have [facility] services? : No Does patient have P4CC services?: No  ADL Screening (condition at time of admission) Patient's cognitive ability adequate to safely complete daily activities?: Yes Patient able to express need for assistance  with ADLs?: Yes Independently performs ADLs?: Yes (appropriate for developmental age)             Merchant navy officerAdvance Directives (For Healthcare) Does Patient Have a Medical Advance Directive?: No Would patient like information on creating a medical advance directive?: No - Patient declined    Additional Information 1:1 In Past 12 Months?: No CIRT Risk: Yes Elopement Risk: Yes Does patient have medical clearance?: Yes     Disposition: TTS discussed pt with Elta GuadeloupeLaurie Parks, NP, who recommends inpt treatment.  TTS to seek placement. Disposition Initial Assessment Completed for this Encounter: Yes Disposition of Patient: Inpatient treatment program  On Site Evaluation by:   Reviewed with Physician:    Lorri FrederickWierda, Marquise Lambson Jon 07/15/2017 5:08 PM

## 2017-07-15 NOTE — Care Management (Signed)
Writer faxed patient to [facility], [facility], [facility], [facility],   

## 2017-07-15 NOTE — ED Notes (Signed)
Mother to take patients belongings home from triage.  Patient wanded and transferred to [facility]

## 2017-07-15 NOTE — ED Notes (Signed)
Bed: [facility] Expected date:  Expected time:  Means of arrival:  Comments: 

## 2017-07-15 NOTE — ED Notes (Signed)
Pts Mothers cell phone number:  [phone]

## 2017-07-15 NOTE — ED Notes (Signed)
Patient eating her lunch calmly.  Mother at bedside.
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
## 2017-07-15 NOTE — ED Notes (Signed)
Bed: [facility]39 Expected date:  Expected time:  Means of arrival:  Comments: 27 

## 2017-07-15 NOTE — ED Notes (Signed)
Pt mother at bedside
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
## 2017-07-15 NOTE — ED Notes (Signed)
Pt admitted to  Room #39. Pt behavior childlike, labile, refusing to follow simple directions, laughing inappropriately at times. Appears to be responding to internal stimuli, Limited assessment d/t pt is  Not forthcoming with information. "Bitch go to hell." Pt denies SI/HI. Encouragement and support provided. Special checks q 15 mins in place for safety, Video monitoring in place, Will continue to monitor,.

## 2017-07-15 NOTE — ED Notes (Signed)
Patient is resting comfortably. 

## 2017-07-15 NOTE — ED Notes (Signed)
Pt laying in bed singing in a childlike voice.

## 2017-07-15 NOTE — ED Notes (Signed)
Pt is sleeping at this time. Vital signs will be taken when patient wakes up. RN notified.

## 2017-07-15 NOTE — ED Notes (Signed)
Pt behavior intrusive, going into empty patient rooms on the hall, not able to follow directions from nursing staff, cussing, verbally aggressive, verbally threatening, speech pressured, religiously preoccupied.

## 2017-07-15 NOTE — ED Notes (Signed)
Patient slamming doors, yelling in her room.  NP notified that patient unwilling to get labs drawn.

## 2017-07-15 NOTE — ED Notes (Signed)
Bed: [facility] Expected date:  Expected time:  Means of arrival:  Comments: 

## 2017-07-16 ENCOUNTER — Encounter (HOSPITAL_COMMUNITY): Payer: Self-pay | Admitting: Emergency Medicine

## 2017-07-16 MED ORDER — STERILE WATER FOR INJECTION IJ SOLN
INTRAMUSCULAR | Status: AC
  Administered 2017-07-16: 08:00:00
  Filled 2017-07-16: qty 10

## 2017-07-16 MED ORDER — ZIPRASIDONE MESYLATE 20 MG IM SOLR
10.0000 mg | Freq: Once | INTRAMUSCULAR | Status: AC
  Administered 2017-07-16: 10 mg via INTRAMUSCULAR
  Filled 2017-07-16: qty 20

## 2017-07-16 NOTE — ED Notes (Signed)
Pt agitated and making verbal threats to staff, pacing in the halls.

## 2017-07-16 NOTE — ED Notes (Signed)
Pt transported to [facility] by [facility]. Pt belongings were given to her mother previously.

## 2017-07-16 NOTE — ED Notes (Signed)
Patient has been accepted to [facility] after 8 am by [employer]. Accepted physician Dr. Betti Cruzeddy and reports can be called [phone]. [facility]

## 2017-07-16 NOTE — ED Notes (Signed)
Pt given Geodon for agitation. Required hands on to steady pt and to keep her from jerking her arm.

## 2017-08-21 ENCOUNTER — Encounter (HOSPITAL_COMMUNITY): Payer: Self-pay | Admitting: *Deleted

## 2017-08-21 ENCOUNTER — Inpatient Hospital Stay (HOSPITAL_COMMUNITY)
Admission: AD | Admit: 2017-08-21 | Discharge: 2017-09-03 | DRG: 885 | Disposition: A | Discharge status: Home / Self Care | Payer: Medicaid Other | Source: Intra-hospital | Attending: Psychiatry | Admitting: Psychiatry

## 2017-08-21 ENCOUNTER — Emergency Department (HOSPITAL_COMMUNITY)
Admission: EM | Admit: 2017-08-21 | Discharge: 2017-08-21 | Disposition: A | Discharge status: Other Acute Inpatient Hospital | Payer: Medicaid Other | Attending: Emergency Medicine | Admitting: Emergency Medicine

## 2017-08-21 ENCOUNTER — Encounter (HOSPITAL_COMMUNITY): Payer: Self-pay

## 2017-08-21 ENCOUNTER — Other Ambulatory Visit: Payer: Self-pay

## 2017-08-21 DIAGNOSIS — Z888 Allergy status to other drugs, medicaments and biological substances status: Secondary | ICD-10-CM

## 2017-08-21 DIAGNOSIS — R451 Restlessness and agitation: Secondary | ICD-10-CM | POA: Diagnosis not present

## 2017-08-21 DIAGNOSIS — Z79899 Other long term (current) drug therapy: Secondary | ICD-10-CM

## 2017-08-21 DIAGNOSIS — R45851 Suicidal ideations: Secondary | ICD-10-CM | POA: Diagnosis not present

## 2017-08-21 DIAGNOSIS — F329 Major depressive disorder, single episode, unspecified: Secondary | ICD-10-CM | POA: Diagnosis not present

## 2017-08-21 DIAGNOSIS — F419 Anxiety disorder, unspecified: Secondary | ICD-10-CM | POA: Diagnosis not present

## 2017-08-21 DIAGNOSIS — F200 Paranoid schizophrenia: Principal | ICD-10-CM | POA: Diagnosis present

## 2017-08-21 DIAGNOSIS — G4700 Insomnia, unspecified: Secondary | ICD-10-CM | POA: Diagnosis present

## 2017-08-21 DIAGNOSIS — Z818 Family history of other mental and behavioral disorders: Secondary | ICD-10-CM | POA: Diagnosis not present

## 2017-08-21 DIAGNOSIS — Z046 Encounter for general psychiatric examination, requested by authority: Secondary | ICD-10-CM | POA: Diagnosis present

## 2017-08-21 DIAGNOSIS — Z639 Problem related to primary support group, unspecified: Secondary | ICD-10-CM | POA: Diagnosis not present

## 2017-08-21 DIAGNOSIS — R450 Nervousness: Secondary | ICD-10-CM | POA: Diagnosis not present

## 2017-08-21 LAB — COMPREHENSIVE METABOLIC PANEL
ALK PHOS: 74 U/L (ref 38–126)
ALT: 32 U/L (ref 14–54)
AST: 34 U/L (ref 15–41)
Albumin: 4.1 g/dL (ref 3.5–5.0)
Anion gap: 9 (ref 5–15)
BUN: 10 mg/dL (ref 6–20)
CALCIUM: 9.4 mg/dL (ref 8.9–10.3)
CO2: 22 mmol/L (ref 22–32)
Chloride: 105 mmol/L (ref 101–111)
Creatinine, Ser: 0.54 mg/dL (ref 0.44–1.00)
GFR calc Af Amer: >60 mL/min (ref >60)
GFR calc non Af Amer: >60 mL/min (ref >60)
Glucose, Bld: 110 mg/dL — ABNORMAL HIGH (ref 65–99)
Potassium: 3.8 mmol/L (ref 3.5–5.1)
SODIUM: 136 mmol/L (ref 135–145)
Total Bilirubin: 0.7 mg/dL (ref 0.3–1.2)
Total Protein: 8 g/dL (ref 6.5–8.1)

## 2017-08-21 LAB — CBC
HEMATOCRIT: 37.8 % (ref 36.0–46.0)
HEMOGLOBIN: 12.4 g/dL (ref 12.0–15.0)
MCH: 24.8 pg — ABNORMAL LOW (ref 26.0–34.0)
MCHC: 32.8 g/dL (ref 30.0–36.0)
MCV: 75.6 fL — AB (ref 78.0–100.0)
Platelets: 350 10*3/uL (ref 150–400)
RBC: 5 MIL/uL (ref 3.87–5.11)
RDW: 14.3 % (ref 11.5–15.5)
WBC: 10.8 10*3/uL — ABNORMAL HIGH (ref 4.0–10.5)

## 2017-08-21 LAB — SALICYLATE LEVEL: Salicylate Lvl: <7 mg/dL (ref 2.8–30.0)

## 2017-08-21 LAB — ACETAMINOPHEN LEVEL: Acetaminophen (Tylenol), Serum: <10 ug/mL — ABNORMAL LOW (ref 10–30)

## 2017-08-21 LAB — I-STAT BETA HCG BLOOD, ED (MC, WL, AP ONLY): I-stat hCG, quantitative: <5 m[IU]/mL (ref <5)

## 2017-08-21 LAB — ETHANOL: Alcohol, Ethyl (B): <10 mg/dL (ref <10)

## 2017-08-21 MED ORDER — CARBAMAZEPINE ER 200 MG PO TB12
200.0000 mg | ORAL_TABLET | Freq: Two times a day (BID) | ORAL | Status: DC
  Filled 2017-08-21: qty 1

## 2017-08-21 MED ORDER — ARIPIPRAZOLE 10 MG PO TABS
10.0000 mg | ORAL_TABLET | Freq: Every day | ORAL | Status: DC
  Filled 2017-08-21: qty 1

## 2017-08-21 MED ORDER — HYDROXYZINE HCL 25 MG PO TABS
25.0000 mg | ORAL_TABLET | Freq: Two times a day (BID) | ORAL | Status: DC
  Administered 2017-08-22 – 2017-09-01 (×16): 25 mg via ORAL
  Filled 2017-08-21 (×26): qty 1

## 2017-08-21 MED ORDER — HYDROXYZINE HCL 25 MG PO TABS
25.0000 mg | ORAL_TABLET | Freq: Two times a day (BID) | ORAL | Status: DC
  Filled 2017-08-21: qty 1

## 2017-08-21 MED ORDER — ASENAPINE MALEATE 5 MG SL SUBL
10.0000 mg | SUBLINGUAL_TABLET | Freq: Once | SUBLINGUAL | Status: DC
  Filled 2017-08-21: qty 2

## 2017-08-21 MED ORDER — ARIPIPRAZOLE 10 MG PO TABS
10.0000 mg | ORAL_TABLET | Freq: Every day | ORAL | Status: DC
  Filled 2017-08-21 (×2): qty 1

## 2017-08-21 MED ORDER — ALUM & MAG HYDROXIDE-SIMETH 200-200-20 MG/5ML PO SUSP: 30.0000 mL | ORAL | Status: DC | PRN

## 2017-08-21 MED ORDER — ONDANSETRON HCL 4 MG PO TABS: 4.0000 mg | ORAL_TABLET | Freq: Three times a day (TID) | ORAL | Status: DC | PRN

## 2017-08-21 MED ORDER — ACETAMINOPHEN 325 MG PO TABS: 650.0000 mg | ORAL_TABLET | ORAL | Status: DC | PRN

## 2017-08-21 MED ORDER — MAGNESIUM HYDROXIDE 400 MG/5ML PO SUSP: 30.0000 mL | Freq: Every day | ORAL | Status: DC | PRN

## 2017-08-21 MED ORDER — ACETAMINOPHEN 325 MG PO TABS: 650.0000 mg | ORAL_TABLET | Freq: Four times a day (QID) | ORAL | Status: DC | PRN

## 2017-08-21 MED ORDER — CARBAMAZEPINE ER 200 MG PO TB12
200.0000 mg | ORAL_TABLET | Freq: Two times a day (BID) | ORAL | Status: DC
  Filled 2017-08-21 (×5): qty 1
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
## 2017-08-21 NOTE — ED Notes (Signed)
Pt is non-compliant with both medicine and procedures.  Pt has remained in her room and withdrawn.

## 2017-08-21 NOTE — ED Provider Notes (Signed)
[facility]-EMERGENCY DEPT Provider Note   CSN: [id number] Arrival date & time: 08/21/17  0456     History   Chief Complaint Chief Complaint  Patient presents with  . IVC  . Hallucinations    HPI Lorraine Bryant is a 32 y.o. female.  HPI Lorraine Bryant is a 32 y.o. female presents to emergency department under involuntary commitment for psychosis.  Patient apparently tried to jump out of a moving car.  She explains to me that she tried to get away from her mother who "deceived me and pretended to be my mother but she really is not."  Patient states that she feels like people are following her and trying to harm her.  She states that her mother's boyfriend is "the devil, the real one."  She states that her job in life is to serve the [employer] and that is what she intends to do.  She states that she cannot be around devils.  She does admit to try to jump out of the car, but states it is only because she try to get away from her mother.  Patient reports trying to run away from her, states that she tried to run around the house as a knock on people's doors, to hide from her mother.  She states that she finally called her aunt [name] who brought her here.    Past Medical History:  Diagnosis Date  . Depression   . Hallucinations   . Psychosis (HCC)    per pt and family last year    Patient Active Problem List   Diagnosis Date Noted  . Paranoid schizophrenia (HCC) 07/15/2017    Past Surgical History:  Procedure Laterality Date  . NO PAST SURGERIES      OB History    Gravida Para Term Preterm AB Living   0 0 0 0 0 0   SAB TAB Ectopic Multiple Live Births   0 0 0 0 0       Home Medications    Prior to Admission medications   Medication Sig Start Date End Date Taking? Authorizing Provider  ARIPiprazole (ABILIFY) 10 MG tablet Take 10 mg by mouth daily.   Yes [provider]  ARIPiprazole ER (ABILIFY MAINTENA) 400 MG PRSY Inject 400 mg into the  muscle every 30 (thirty) days.   Yes [provider]  carbamazepine (TEGRETOL XR) 200 MG 12 hr tablet Take 200 mg by mouth 2 (two) times daily.   Yes [provider]  hydrOXYzine (ATARAX/VISTARIL) 25 MG tablet Take 25 mg by mouth 2 (two) times daily.    Yes [provider]  LORazepam (ATIVAN) 1 MG tablet Take 1 mg by mouth 2 (two) times daily.    Yes [provider]    Family History No family history on file.  Social History Social History   Tobacco Use  . Smoking status: Never Smoker  . Smokeless tobacco: Never Used  Substance Use Topics  . Alcohol use: No  . Drug use: No    Comment: unable to assess d/t pt not responding to questioning     Allergies   Abilify [aripiprazole]   Review of Systems Review of Systems  Constitutional: Negative for chills and fever.  Respiratory: Negative for cough, chest tightness and shortness of breath.   Cardiovascular: Negative for chest pain, palpitations and leg swelling.  Gastrointestinal: Negative for abdominal pain, diarrhea, nausea and vomiting.  Genitourinary: Negative for dysuria, flank pain and pelvic pain.  Musculoskeletal:  Negative for arthralgias, myalgias, neck pain and neck stiffness.  Skin: Negative for rash.  Neurological: Negative for dizziness, weakness and headaches.  Psychiatric/Behavioral: Positive for confusion and hallucinations. The patient is nervous/anxious.   All other systems reviewed and are negative.    Physical Exam Updated Vital Signs BP (!) 131/92 (BP Location: Left Arm)   Pulse 99   Temp 98.9 F (37.2 C) (Oral)   Resp 18   Ht 5\' 5"  (1.651 m)   Wt 81.6 kg (180 lb)   SpO2 97%   BMI 29.95 kg/m   Physical Exam  Constitutional: She appears well-developed and well-nourished. No distress.  HENT:  Head: Normocephalic.  Eyes: Conjunctivae are normal.  Neck: Neck supple.  Cardiovascular: Normal rate, regular rhythm and normal heart sounds.  Pulmonary/Chest: Effort  normal and breath sounds normal. No respiratory distress. She has no wheezes. She has no rales.  Abdominal: Soft. Bowel sounds are normal. She exhibits no distension. There is no tenderness. There is no rebound.  Musculoskeletal: She exhibits no edema.  Neurological: She is alert.  Skin: Skin is warm and dry.  Psychiatric: Her mood appears anxious. Her speech is rapid and/or pressured. She is hyperactive. Thought content is paranoid and delusional.  Nursing note and vitals reviewed.    ED Treatments / Results  Labs (all labs ordered are listed, but only abnormal results are displayed) Labs Reviewed  COMPREHENSIVE METABOLIC PANEL - Abnormal; Notable for the following components:      Result Value   Glucose, Bld 110 (*)    All other components within normal limits  ACETAMINOPHEN LEVEL - Abnormal; Notable for the following components:   Acetaminophen (Tylenol), Serum <10 (*)    All other components within normal limits  CBC - Abnormal; Notable for the following components:   WBC 10.8 (*)    MCV 75.6 (*)    MCH 24.8 (*)    All other components within normal limits  ETHANOL  SALICYLATE LEVEL  RAPID URINE DRUG SCREEN, HOSP PERFORMED  I-STAT BETA HCG BLOOD, ED ([facility], [facility], [facility] ONLY)    EKG  EKG Interpretation None       Radiology No results found.  Procedures Procedures (including critical care time)  Medications Ordered in ED Medications  ondansetron (ZOFRAN) tablet 4 mg (not administered)  acetaminophen (TYLENOL) tablet 650 mg (not administered)     Initial Impression / Assessment and Plan / ED Course  I have reviewed the triage vital signs and the nursing notes.  Pertinent labs & imaging results that were available during my care of the patient were reviewed by me and considered in my medical decision making (see chart for details).     Patient in emergency department under involuntary commitment, for possible acute psychosis and trying to jump out of the car while  it was moving.  She has no injuries.  During my exam, patient has rapid and pressured speech, she has difficulty with staying on track during her conversation.  It is unclear to me if the information she is providing is the actual events that occurred or part of her psychosis.  I will get TTS to assess her.  She is medically cleared at this time.  Vitals:   08/21/17 0508 08/21/17 0520 08/21/17 0522  BP:  (!) 131/92   Pulse:  99   Resp:   18  Temp:  98.9 F (37.2 C)   TempSrc:  Oral   SpO2:  97%   Weight: 81.6 kg (180 lb)  Height: 5\' 5"  (1.651 m)      Final Clinical Impressions(s) / ED Diagnoses   Final diagnoses:  Paranoid schizophrenia [facility])    ED Discharge Orders    None       Jaynie Crumble, PA-C 08/21/17 1637    Zadie Rhine, MD 08/21/17 2307

## 2017-08-21 NOTE — Progress Notes (Signed)
Frazier ButtJaleesia is a 32 year old female being admitted involuntarily to [facility] from [facility].  She came to the ED under IVC for psychosis, paranoia and jumping out of the car to get away from her mother.  She believes that her mother is her "false mama" and her mother's boyfriend is the devil.  She was running around the neighborhood knocking on doors to get away from her mother.   [facility], she was very guarded, labile, refusing to participate in admission process.  She is fearful of people getting near her.  She is continually talking about her false mother and her mother's boyfriend being the devil.  She would not allow RN to take her vital signs but did allow us to take her weight.  She continually cried and exhibited rambling speech claiming that her husband (married last week) is going to get her out of here.  She denies any pain or discomfort and appeared to be in no physical distress.  Oriented her to the unit.  Admission paperwork completed and signed.  Belongings searched and secured in locker # 31.  Skin assessment completed and no skin issues noted.  Q 15 minute checks initiated for safety.  We will monitor the progress towards her goals.

## 2017-08-21 NOTE — ED Notes (Signed)
Pt discharged safely with [employer].  Pt was very tearful but cooperative.  All belongings were sent with patient.

## 2017-08-21 NOTE — Progress Notes (Signed)
Patient is lying in bed wailing and can be heard at nurse's station. Patient states to Clinical research associatewriter and MHT, "I need you to look up my husband, I can't find him. We were married last week. [place] is in [place]". Patient tearful, sobbing. Patient refuses PRN medication for anxiety. Emotional support offered. Patient remains with rambling, incoherent speech, and disorganized/delusional thinking.

## 2017-08-21 NOTE — ED Triage Notes (Signed)
Pt brought in by [employer] under IVC d/t pt attempting to jump out of car when with mom.

## 2017-08-21 NOTE — ED Notes (Signed)
Pt oriented to room and unit.  Pt denies S/I, H/I, and AVH.  Pt is pleasant and cooperative.  She is asking when discharge will be.  15 minute checks and video monitoring in place.

## 2017-08-21 NOTE — ED Notes (Signed)
Pts mom wanted to share some collateral info.  Pt was referred to endocrinologist to test her pituitary.  Pt has not been able to go yet.  This patient sees Dr. Jannifer FranklinAkintayo and the mother said he was concerned about using different medications as her prolactin level was extremely elevated.  Mother states that she has been giving this patient her abilify but agrees that patient could be cheeking them.  She is very concerned as this patient has not been this sick before.  She states that patient is very delusional and even jumped from a moving car.

## 2017-08-21 NOTE — BH Assessment (Signed)
[facility] Assessment Progress Note  Per Juanetta BeetsJacqueline Norman, DO, this pt requires psychiatric hospitalization.  Berneice Heinrichina Tate, RN, [facility].  Pt presents under IVC initiated by pt's mother, and upheld by Dr Sharma CovertNorman, and [facility].  Pt's nurse, Kendal Hymendie, has been notified, and agrees to call report to [phone].  Pt is to be transported via [employer] enforcement.   Doylene Canninghomas Tawn Fitzner, [place] Behavioral Health Coordinator [phone]

## 2017-08-21 NOTE — Progress Notes (Signed)
Patient attempted to attend group but left early sobbing. Emotional support provided. Will continue to monitor.

## 2017-08-21 NOTE — ED Notes (Addendum)
Pt stated "My mom has been deceiving me since birth.  She is my foster mom.  I only hear good voices.  My mom lives with the devil, that's Nate.  I wanted to go to [employer] [place] house but she lied to me so I was hiding behind trees and bushes.  Celine Ahrunt [name] is not really my aunt.  Dr. Mervyn SkeetersA changed my medicines about 3 wks ago to Ability.  I tried to tell them.  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

## 2017-08-21 NOTE — Progress Notes (Signed)
Nursing Progress Note 1900-0730  D) Patient presents paranoid and guarded. Patient refuses to engage with writer but is able to speak with MHT. Patient isolates to her room. Per MHT, patient starts to cry during 15 minute safety checks. Patient refuses scheduled and PRN medications this evening. Patient denies SI/HI or pain. No AVH reported at this time. Patient remains with delusions about being married.  A) Emotional support given. 1:1 interaction and active listening provided. Snacks and fluids provided. Opportunities for questions or concerns presented to patient. Patient encouraged to continue to work on treatment goals. Labs, vital signs and patient behavior monitored throughout shift. Patient safety maintained with q15 min safety checks. Low fall risk precautions in place and reviewed with patient; patient verbalized understanding.  R) Patient remains safe on the unit at this time. Will continue to monitor.

## 2017-08-21 NOTE — Tx Team (Signed)
Initial Treatment Plan 08/21/2017 6:36 PM Lorraine Bryant [id number]    PATIENT STRESSORS: Financial difficulties Medication change or noncompliance   PATIENT STRENGTHS: General fund of knowledge Physical Health Supportive family/friends   PATIENT IDENTIFIED PROBLEMS: Psychosis  Paranoia  "I want help with love, peace and true happiness"                 DISCHARGE CRITERIA:  Adequate post-discharge living arrangements Improved stabilization in mood, thinking, and/or behavior Verbal commitment to aftercare and medication compliance  PRELIMINARY DISCHARGE PLAN: Outpatient therapy Medication management  PATIENT/FAMILY INVOLVEMENT: This treatment plan has been presented to and reviewed with the patient, Lorraine Bryant.  The patient and family have been given the opportunity to ask questions and make suggestions.  Levin BaconHeather V Mackynzie Woolford, RN 08/21/2017, 6:36 PM

## 2017-08-21 NOTE — ED Notes (Signed)
Bed: WA29 Expected date:  Expected time:  Means of arrival:  Comments: [employer]  Female  IVC  cooperative

## 2017-08-21 NOTE — ED Notes (Signed)
Pt refusing to provide urine specimen stating "I don't want to give a specimen.  I didn't do it at the hospital the last time.  I don't need to do that."

## 2017-08-21 NOTE — ED Notes (Signed)
Pt was encouraged to provide urine sample.  Pt adamantly refused.

## 2017-08-22 DIAGNOSIS — R45851 Suicidal ideations: Secondary | ICD-10-CM

## 2017-08-22 DIAGNOSIS — R450 Nervousness: Secondary | ICD-10-CM

## 2017-08-22 DIAGNOSIS — F419 Anxiety disorder, unspecified: Secondary | ICD-10-CM

## 2017-08-22 DIAGNOSIS — Z818 Family history of other mental and behavioral disorders: Secondary | ICD-10-CM

## 2017-08-22 DIAGNOSIS — F200 Paranoid schizophrenia: Principal | ICD-10-CM

## 2017-08-22 DIAGNOSIS — Z639 Problem related to primary support group, unspecified: Secondary | ICD-10-CM

## 2017-08-22 MED ORDER — HALOPERIDOL 5 MG PO TABS
5.0000 mg | ORAL_TABLET | Freq: Two times a day (BID) | ORAL | Status: DC
  Administered 2017-08-22 – 2017-08-27 (×11): 5 mg via ORAL
  Filled 2017-08-22 (×16): qty 1

## 2017-08-22 MED ORDER — LORAZEPAM 2 MG/ML IJ SOLN
1.0000 mg | Freq: Four times a day (QID) | INTRAMUSCULAR | Status: DC | PRN
  Filled 2017-08-22: qty 1

## 2017-08-22 MED ORDER — LORAZEPAM 1 MG PO TABS
1.0000 mg | ORAL_TABLET | Freq: Four times a day (QID) | ORAL | Status: DC | PRN
  Administered 2017-08-22 – 2017-08-27 (×5): 1 mg via ORAL
  Filled 2017-08-22 (×6): qty 1

## 2017-08-22 MED ORDER — BENZTROPINE MESYLATE 1 MG/ML IJ SOLN: 0.5000 mg | Freq: Two times a day (BID) | INTRAMUSCULAR | Status: DC | PRN

## 2017-08-22 MED ORDER — TRAZODONE HCL 50 MG PO TABS
50.0000 mg | ORAL_TABLET | Freq: Every evening | ORAL | Status: DC | PRN
  Administered 2017-08-24 – 2017-08-27 (×4): 50 mg via ORAL
  Filled 2017-08-22 (×3): qty 1

## 2017-08-22 MED ORDER — HALOPERIDOL 5 MG PO TABS
5.0000 mg | ORAL_TABLET | Freq: Three times a day (TID) | ORAL | Status: DC | PRN
  Administered 2017-08-22 – 2017-08-23 (×2): 5 mg via ORAL
  Filled 2017-08-22 (×2): qty 1

## 2017-08-22 MED ORDER — HALOPERIDOL LACTATE 5 MG/ML IJ SOLN
5.0000 mg | Freq: Three times a day (TID) | INTRAMUSCULAR | Status: DC | PRN
  Filled 2017-08-22: qty 1

## 2017-08-22 MED ORDER — BENZTROPINE MESYLATE 0.5 MG PO TABS
0.5000 mg | ORAL_TABLET | Freq: Two times a day (BID) | ORAL | Status: DC | PRN
  Filled 2017-08-22: qty 1

## 2017-08-22 NOTE — [facility] Group Notes (Addendum)
[facility] LCSW Group Therapy  08/22/2017  1:05 PM  Type of Therapy:  Group therapy  Participation Level:  Active  Participation Quality:  Attentive  Affect:  Flat  Cognitive:  Oriented  Insight:  Limited  Engagement in Therapy:  Limited  Modes of Intervention:  Discussion, Socialization  Summary of Progress/Problems:  Chaplain was here to lead a group on themes of hope and courage. "Hope is imagining and envisioning."  Went on to talk about her "hiusband" Clifton Custardaron, whom her mother states is a boyfriend from 10 years ago when she was in undergraduate. Euphoric-lots of laughing at nothing in particular. Made lots of word rhymes. Intrusive throughout, but others ignored her. Daryel Geraldorth, Devany Aja B 08/22/2017 1:15 PM
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
## 2017-08-22 NOTE — Progress Notes (Addendum)
Nursing 1:1 Note @ 2200 Next Note @ 0200   D: Pt observed sleeping in bed with eyes closed. RR even and unlabored. No distress noted. Pt took medications with encouragement. Pt appears paranoid and childlike.Presented with tangential thoughts. Animated/anxious in affect and mood. Denies SI/HI/Pain/AVH at this time.  A: 1:1 observation continues for safety. Sitter within arms reach. R: Pt remains safe.
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
## 2017-08-22 NOTE — Progress Notes (Signed)
Patient remains on 1:1.  Patient continues to need redirection due to sexually inappropriate behaviors.  Patient is agitated and threatening toward staff. Patient dancing provocatively in day room. 1:1 staff redirecting patient as needed. Continue to monitor as planned.   

## 2017-08-22 NOTE — Progress Notes (Signed)
Patient is awake and approached the nurse's station. Patient is tearful and requesting to speak to MHT. MHT spoke 1:1 with patient in the dayroom. Patient crying and stating to MHT, "I miss my husband. I keep dreaming of him and don't want to lose my memories of him". MHT emotionally supported patient. Patient directed back to room. Patient remains tearful and anxious but denies need for medications. Will continue to monitor.

## 2017-08-22 NOTE — BHH Counselor (Signed)
Adult Comprehensive Assessment  Patient ID: Lorraine Bryant, female   DOB: 09/04/1985, 32 y.o.   MRN: [id number]  Information Source: Information source: (Mother, Lorraine Bryant, [place] [phone])  Current Stressors:  Educational / Learning stressors: Enrolled in school for masters degree Employment / Job issues: Web designerDisability Financial / Lack of resources (include bankruptcy): Fixed income Housing / Lack of housing: Lives with mother and her fiance  Living/Environment/Situation:  Living Arrangements: Parent Living conditions (as described by patient or guardian): good How long has patient lived in current situation?: [facility], has lived with mother What is atmosphere in current home: Comfortable, Supportive  Family History:  Are you sexually active?: No What is your sexual orientation?: hetero Does patient have children?: No  Childhood History:  By whom was/is the patient raised?: Mother Description of patient's relationship with caregiver when they were a child: good Patient's description of current relationship with people who raised him/her: good Does patient have siblings?: Yes Number of Siblings: 1 Description of patient's current relationship with siblings: sister-angry at her right now, but close overall Did patient suffer any verbal/emotional/physical/sexual abuse as a child?: No Did patient suffer from severe childhood neglect?: No Has patient ever been sexually abused/assaulted/raped as an adolescent or adult?: No Was the patient ever a victim of a crime or a disaster?: No Witnessed domestic violence?: No Has patient been effected by domestic violence as an adult?: No  Education:  Highest grade of school patient has completed: currently working on Event organisermasters degree in psychology Currently a student?: Yes Name of school: [employer] How long has the patient attended?: 1 year Learning disability?: No  Employment/Work Situation:   Employment  situation: Consulting civil engineertudent What is the longest time patient has a held a job?: 6 months 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 Has patient ever been in the [employer] Lilly and Companymilitary?: No Are There Guns or Other Weapons in Your Home?: No  Financial Resources:   Surveyor, quantityinancial resources: [employer] EnergyMedicaid, Actoreceives SSDI  Alcohol/Substance Abuse:   What has been your use of drugs/alcohol within the last 12 months?: N/A If attempted suicide, did drugs/alcohol play a role in this?: No Alcohol/Substance Abuse Treatment Hx: Denies past history Has alcohol/substance abuse ever caused legal problems?: No  Social Support System:   Conservation officer, natureatient's Community Support System: Good Describe Community Support System: Family Type of faith/religion: Ephriam KnucklesChristian How does patient's faith help to cope with current illness?: Involved at church  Leisure/Recreation:   Leisure and Hobbies: Loves working with children  Strengths/Needs:      Discharge Plan:   Does patient have access to transportation?: Yes Will patient be returning to same living situation after discharge?: Yes Currently receiving community mental health services: Yes (From Whom)([facility]) Does patient have financial barriers related to discharge medications?: No  Summary/Recommendations:   Summary and Recommendations (to be completed by the evaluator): Lorraine Bryant is a 32 YO Aa female diagosed with Schizophrenia.  She presents IVD'd by mother due to increasing positive symptoms due to a number of ineffective medication changes since August of this year.  The change was made due to increased Prolactin level.  At d/c., she will return home and follow up at [facility].  While here, Lorraine Bryant can benefit from crises stabilization, medication management, therapeutic milieu and coordination with outpt provider.  Lorraine Bryant. 08/22/2017

## 2017-08-22 NOTE — Progress Notes (Signed)
Recreation Therapy Notes  Date: 08/22/17 Time: 1000 Location: 500 Hall Dayroom  Group Topic: Self-Esteem  Goal Area(s) Addresses:  Patient will successfully identify positive attributes about themselves.  Patient will successfully identify benefit of improved self-esteem.   Intervention: Holiday representativeConstruction paper, markers  Activity: Personalized Plates.  Patients were to create their own license plates that described unique or important things about them.  Education:  Self-Esteem, Building control surveyorDischarge Planning.   Education Outcome: Acknowledges education/In group clarification offered/Needs additional education  Clinical Observations/Feedback: Pt did not attend group.    Caroll RancherMarjette Seryna Marek, LRT/CTRS         Caroll RancherLindsay, Tanishi Nault A 08/22/2017 12:51 PM

## 2017-08-22 NOTE — Tx Team (Signed)
Interdisciplinary Treatment and Diagnostic Plan Update  08/22/2017 Time of Session: 8:21 AM  Lorraine Bryant MRN: [id number]  Principal Diagnosis: <principal problem not specified>  Secondary Diagnoses: Active Problems:   Schizophrenia, paranoid ([place])   Current Medications:  Current Facility-Administered Medications  Medication Dose Route Frequency Provider Last Rate Last Dose  . acetaminophen (TYLENOL) tablet 650 mg  650 mg Oral Q4H PRN Patrecia Pour, NP      . alum & mag hydroxide-simeth (MAALOX/MYLANTA) 200-200-20 MG/5ML suspension 30 mL  30 mL Oral Q4H PRN Patrecia Pour, NP      . ARIPiprazole (ABILIFY) tablet 10 mg  10 mg Oral Daily Patrecia Pour, NP      . carbamazepine (TEGRETOL XR) 12 hr tablet 200 mg  200 mg Oral BID Patrecia Pour, NP      . hydrOXYzine (ATARAX/VISTARIL) tablet 25 mg  25 mg Oral BID Patrecia Pour, NP      . magnesium hydroxide (MILK OF MAGNESIA) suspension 30 mL  30 mL Oral Daily PRN Patrecia Pour, NP      . ondansetron [facility]) tablet 4 mg  4 mg Oral Q8H PRN Patrecia Pour, NP        PTA Medications: Medications Prior to Admission  Medication Sig Dispense Refill Last Dose  . ARIPiprazole (ABILIFY) 10 MG tablet Take 10 mg by mouth daily.   08/20/2017 at Unknown time  . ARIPiprazole ER (ABILIFY MAINTENA) 400 MG PRSY Inject 400 mg into the muscle every 30 (thirty) days.   Past Month at Unknown time  . carbamazepine (TEGRETOL XR) 200 MG 12 hr tablet Take 200 mg by mouth 2 (two) times daily.   08/20/2017 at 10pm  . hydrOXYzine (ATARAX/VISTARIL) 25 MG tablet Take 25 mg by mouth 2 (two) times daily.    08/20/2017 at Unknown time  . LORazepam (ATIVAN) 1 MG tablet Take 1 mg by mouth 2 (two) times daily.    08/20/2017 at Unknown time    Patient Stressors: Financial difficulties Medication change or noncompliance  Patient Strengths: General fund of knowledge Physical Health Supportive family/friends  Treatment Modalities: Medication Management, Group  therapy, Case management,  1 to 1 session with clinician, Psychoeducation, Recreational therapy.   Physician Treatment Plan for Primary Diagnosis: <principal problem not specified> Long Term Goal(s): Improvement in symptoms so as ready for discharge  Short Term Goals:    Medication Management: Evaluate patient's response, side effects, and tolerance of medication regimen.  Therapeutic Interventions: 1 to 1 sessions, Unit Group sessions and Medication administration.  Evaluation of Outcomes: Progressing  Physician Treatment Plan for Secondary Diagnosis: Active Problems:   Schizophrenia, paranoid ([place])   Long Term Goal(s): Improvement in symptoms so as ready for discharge  Short Term Goals:    Medication Management: Evaluate patient's response, side effects, and tolerance of medication regimen.  Therapeutic Interventions: 1 to 1 sessions, Unit Group sessions and Medication administration.  Evaluation of Outcomes: Progressing   RN Treatment Plan for Primary Diagnosis: <principal problem not specified> Long Term Goal(s): Knowledge of disease and therapeutic regimen to maintain health will improve  Short Term Goals: Ability to identify and develop effective coping behaviors will improve and Compliance with prescribed medications will improve  Medication Management: RN will administer medications as ordered by provider, will assess and evaluate patient's response and provide education to patient for prescribed medication. RN will report any adverse and/or side effects to prescribing provider.  Therapeutic Interventions: 1 on 1 counseling sessions, Psychoeducation, Medication administration, Evaluate  responses to treatment, Monitor vital signs and CBGs as ordered, Perform/monitor CIWA, COWS, AIMS and Fall Risk screenings as ordered, Perform wound care treatments as ordered.  Evaluation of Outcomes: Progressing   LCSW Treatment Plan for Primary Diagnosis: <principal problem not  specified> Long Term Goal(s): Safe transition to appropriate next level of care at discharge, Engage patient in therapeutic group addressing interpersonal concerns.  Short Term Goals: Engage patient in aftercare planning with referrals and resources  Therapeutic Interventions: Assess for all discharge needs, 1 to 1 time with Social worker, Explore available resources and support systems, Assess for adequacy in community support network, Educate family and significant other(s) on suicide prevention, Complete Psychosocial Assessment, Interpersonal group therapy.  Evaluation of Outcomes: Met  Return home, follow up [facility]   Progress in Treatment: Attending groups: Yes Participating in groups: Yes Taking medication as prescribed: Yes Toleration medication: Yes, no side effects reported at this time Family/Significant other contact made: No Patient understands diagnosis: No  Limited insight Discussing patient identified problems/goals with staff: Yes Medical problems stabilized or resolved: Yes Denies suicidal/homicidal ideation: Yes Issues/concerns per patient self-inventory: None Other: N/A  New problem(s) identified: None identified at this time.   New Short Term/Long Term Goal(s): "I want help with love, peace and true happiness"  Discharge Plan or Barriers:   Reason for Continuation of Hospitalization: Disorganization Paranoia Mood Lability Medication stabilization   Estimated Length of Stay: 1/9  Attendees: Patient: Lorraine Bryant 08/22/2017  8:21 AM  Physician: Maris Berger, MD 08/22/2017  8:21 AM  Nursing: Sena Hitch, RN 08/22/2017  8:21 AM  RN Care Manager: Lars Pinks, RN 08/22/2017  8:21 AM  Social Worker: Ripley Fraise 08/22/2017  8:21 AM  Recreational Therapist: Winfield Cunas 08/22/2017  8:21 AM  Other: Norberto Sorenson 08/22/2017  8:21 AM  Other:  08/22/2017  8:21 AM    Scribe for Treatment Team:  Roque Lias LCSW 08/22/2017 8:21 AM

## 2017-08-22 NOTE — Progress Notes (Signed)
Patient remains on 1:1.  Patient continues to need redirection due to sexually inappropriate behaviors.  Patient is agitated and threatening toward staff. Patient dancing provocatively in day room. 1:1 staff redirecting patient as needed. Continue to monitor as planned.

## 2017-08-22 NOTE — [facility] Suicide Risk Assessment (Signed)
[facility] INPATIENT:  Family/Significant Other Suicide Prevention Education  Suicide Prevention Education:  Education Completed; Mother, Lorraine Bryant, [phone]  has been identified by the patient as the family member/significant other with whom the patient will be residing, and identified as the person(s) who will aid the patient in the event of a mental health crisis (suicidal ideations/suicide attempt).  With written consent from the patient, the family member/significant other has been provided the following suicide prevention education, prior to the and/or following the discharge of the patient.  The suicide prevention education provided includes the following:  Suicide risk factors  Suicide prevention and interventions  National Suicide Hotline telephone number  [facility] assessment telephone number  [facility] Emergency Assistance 911  [facility] and/or Residential Mobile Crisis Unit telephone number  Request made of family/significant other to:  Remove weapons (e.g., guns, rifles, knives), all items previously/currently identified as safety concern.    Remove drugs/medications (over-the-counter, prescriptions, illicit drugs), all items previously/currently identified as a safety concern.  The family member/significant other verbalizes understanding of the suicide prevention education information provided.  The family member/significant other agrees to remove the items of safety concern listed above.  Lorraine Bryant 08/22/2017, 10:53 AM
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
## 2017-08-23 DIAGNOSIS — G4700 Insomnia, unspecified: Secondary | ICD-10-CM

## 2017-08-23 MED ORDER — DIPHENHYDRAMINE HCL 25 MG PO CAPS
25.0000 mg | ORAL_CAPSULE | Freq: Four times a day (QID) | ORAL | Status: DC | PRN
  Administered 2017-08-23: 25 mg via ORAL

## 2017-08-23 MED ORDER — DIPHENHYDRAMINE HCL 50 MG/ML IJ SOLN
INTRAMUSCULAR | Status: AC
  Filled 2017-08-23: qty 1

## 2017-08-23 MED ORDER — DIPHENHYDRAMINE HCL 50 MG/ML IJ SOLN: 25.0000 mg | Freq: Four times a day (QID) | INTRAMUSCULAR | Status: DC | PRN

## 2017-08-23 MED ORDER — DIPHENHYDRAMINE HCL 25 MG PO CAPS
ORAL_CAPSULE | ORAL | Status: AC
  Filled 2017-08-23: qty 1

## 2017-08-23 NOTE — Progress Notes (Signed)
Patient was able to sleep for approximately an hour after being given her medications for anxiety.  Patient has been to the day room sitting in the day room interacting with peers, and is currently being observed pacing in the hallways with 1:1 staff, asking to come off 1:1 monitoring.  Patient however, has unpredictable behaviors, and 1:1 staff is currently being maintained for the safety of patient and other patients on the unit.

## 2017-08-23 NOTE — Progress Notes (Addendum)
Patient approached nurse's station and requested ice water. Patient offered scheduled Haldol and patient refused stating, "no, I'm good. I don't need that". Patient also refused medicine earlier in the shift. Patient then began pacing in the hallways. Sitter asks Clinical research associatewriter if patient has received her medications. Writer informed sitter patient refused her scheduled Haldol. Patient becomes agitated and states, "I didn't refuse anything. Why did you say that? Now I'm going to stay here longer". Patient speech aggressive, rapid and tangential. Writer provides patient with scheduled Haldol. Patient sticks out her tongue three times and states, "I take my meds". Patient has now returned to her room with sitter. Will continue to monitor.

## 2017-08-23 NOTE — Progress Notes (Signed)
Nursing 1:1 Note @ 0200 Next Note @ 0600  D: Pt observed sleeping in bed with eyes closed. RR even and unlabored. No distress noted. Pt woke up once and approach nurses station. No new complaints. A: 1:1 observation continues for safety. Sitter within arms reach. R: Pt remains safe.
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
## 2017-08-23 NOTE — Progress Notes (Signed)
Patient ID: [name], female   DOB: 03/16/1986, 32 y.o.   MRN: [id number]  Patient continued to refused PO medications for agitation, and Ativan 1mg  IM, Haldo l 5mg  IM, and Benadryl 25mg  were drawn in a syringe.  When patient was informed that she would be given these meds intramuscularly since she had refused PO meds for agitation, she all of a sudden became compliant, and asked to get the medications my mouth instead of IM.  Pt was then medicated with Ativan 1mg  PO, Haldol 5mg  PO and Benadryl 25mg  PO at 1138 AM for agitation.  1:1 monitoring continued to aggressive behaviors, will continue to monitor.

## 2017-08-23 NOTE — Progress Notes (Signed)
Care of patient assumed at 0700, pt in bed at that time sleeping. Pt maintained on 1:1 staff monitoring for aggressive behaviors.  Patient woke from bed at 0900, took Haldol 5mg , as scheduled, refused scheduled dose of Atarax 25mg . Pt verbally aggressive, verbally abusive to her 1:1 staff, calling her: "Bitch, whore, stop following me!".  Verbal redirections unsuccessful as patient escalated in these behaviors and refused to talk to NP, refused to complete her patient self inventory sheet, and uncooperative with staff.  Pt offered PO Ativan 1mg  for agitation and refused.  NP has been notified.  1:1 monitoring continued.

## 2017-08-23 NOTE — Progress Notes (Addendum)
1:1 Nursing Progress Note  D: Patient is isolative to her room this evening but does make her needs known with staff. Patient requests writer dial a phone number for her but a busy signal is received. Patient refuses for writer to check chart for accuracy of phone number. Patient has bathed this evening and independently performed her ADLs. Patient has intense staring with a blank expression. Her thought process appears disorganized. Patient currently denies SI/HI/AVH or pain and becomes guarded when prompted by staff. Environment is secured. Sitter observed with [facility] per [facility] policy. Patient refused to take her scheduled Haldol. Patient currently resting in bed in no acute distress.  A: Patient remains on 1:1 observation per provider orders. Low fall risk precautions in place. Patient safety monitored with q15 minute safety checks.  R: Patient remains safe on the unit at this time. Will continue to monitor with 1:1 sitter, q15 min safety checks & q4 hour nursing assessments.

## 2017-08-23 NOTE — Progress Notes (Signed)
Nursing 1:1Note@ 0600 Next Note @ 1000  D:Pt observed resting in bed with eyes open. No new c/o's.  A:1:1 observation continues for safety. Sitter within arms reach. R:Pt remains safe.

## 2017-08-23 NOTE — Progress Notes (Signed)
Pt refused labs this morning provided with encouragement.
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
## 2017-08-23 NOTE — Progress Notes (Signed)
Patient is resting in bed with no signs of distress.  Pt ate dinner, denies having any complaints, required multiple verbal reinforcements to take her scheduled dose of Atarax 25mg .  Pt being maintained on 1:1 staff monitoring for aggressive and unpredictable behaviors.  Report given to oncoming shift.

## 2017-08-24 NOTE — Progress Notes (Signed)
1:1 Nursing Progress Note  D: Patient is awake and approached the nurse's station stating, "something is wrong with your clock because it's only 4 am". Patient attempted to enter nurse's station and read computer screen but was redirected by Scientist, product/process developmentsitter and writer. Patient is currently up in the dayroom. Sitter observed with [facility] per [facility].  A: Patient remains on 1:1 observation per provider orders. Low fall risk precautions in place. Patient safety monitored with q15 minute safety checks.  R: Patient remains safe on the unit at this time. Will continue to monitor with 1:1 sitter, q15 min safety checks & q4 hour nursing assessments.

## 2017-08-24 NOTE — Progress Notes (Signed)
Patient observed in dayroom sitting with MHT, peers, eating snack. Continues to be resistant to care - refusing to complete self inventory, refusing VS - and is aloof in her interactions. "I'm doing fine. I don't need anything." Continues with fixed smile. Remains delusional, easily agitated and uncooperative.  1:1 obs remains in place at present. Patient's behavior redirected as needed. Will continue to encourage involvement in treatment as well as set kind and enforceable limits.   Patient remains safe at present, verbalizes understanding.

## 2017-08-24 NOTE — Progress Notes (Signed)
Patient ID: [name], female   DOB: 10/08/1985, 32 y.o.   MRN: [id number] 1:1 Note   Pt observed resting in bed interacting with her 1:1 staff. Pt appears to be anxious, paranoid and suspicious of others. Pt denied AVH, SI, HI, Pain or depression. Pt does not look to be in any distress at this time. All Pt's questions and concerns addressed. Support, encouragement, and safe environment provided.1:1 staff is present in room with Pt at this time. 1:1 monitoring continues for Pt's safety. 15-minute safety checks also continues at this time. Pt was med compliant.

## 2017-08-24 NOTE — Plan of Care (Signed)
Patient verbalizes understanding of information, education provided. Patient will benefit from ongoing re-education.
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
## 2017-08-24 NOTE — Progress Notes (Signed)
1:1 Nursing Progress Note  D: Patient is observed sleeping in bed. Respirations are even & unlabored. Environment is secured. Sitter observed with patient per [facility] policy.  A: Patient remains on 1:1 observation per provider orders. Low fall risk precautions in place. Patient safety monitored with q15 minute safety checks.  R: Patient remains safe on the unit at this time. Will continue to monitor with 1:1 sitter, q15 min safety checks & q4 hour nursing assessments.  

## 2017-08-24 NOTE — Progress Notes (Addendum)
Patient verbalized this AM that she does not wish for her mother to have any information about her. She became verbally aggressive with staff stating, "she's an impostor. She's fake. You better not have told her anything. Have you all given her information?" Patient has not given family code number and becomes agitated when messages from family are delivered - screaming, crying, wailing. Mother Efraim Kaufmann([place], [phone]) has called and left message however patient resting at present.

## 2017-08-24 NOTE — Progress Notes (Signed)
Patient observed resting in room with MHT at side. Promptly came up for AM meds. Suspicious and reluctant to take meds but did comply. Compliant on mouth check. Remains delusional and states, "I'm not hearing or seeing them but I am fixated on my fiancee. Well, he's my husband now. We got married. His name is Lorraine Bryant." Patient smiling, superficial. Mood pleasant, suspicious.  1:1 obs remains in place due to patient's bizarre, unpredictable behavior. Medicated per orders, no prns requested or required at present. Emotional support and reassurance provided. Encouraged completion of self inventory.  Patient verbalizes understanding of POC. Denies AVH/SI/HI and remains safe on level I obs.

## 2017-08-24 NOTE — Progress Notes (Signed)
Patient observed resting in bed on R side. RR WNL, even and unlabored. Patient agitated, restless and pacing earlier (approx 1550.) "I just want to get off this 1 on 1. I want to go home." When this writer commented on medications that would help her anxiety patient responds,  "oh no, I'm not anxious. I'm fine." Patient continues with bizarre affect, fixed smile.   Due to presentation, 1700 medications given slightly early. Emotional support offered. 1:1 staff remains at side.   Patient remains safe. Patient calmer after med admin.

## 2017-08-24 NOTE — [facility] Group Notes (Signed)
[facility] LCSW Group Therapy Note  Date/Time:  08/24/2017  11:00AM-12:00PM  Type of Therapy and Topic:  Group Therapy:  Music and Mood  Participation Level:  Did Not Attend   Description of Group: In this process group, members listened to a variety of genres of music and identified that different types of music evoke different responses.  Patients were encouraged to identify music that was soothing for them and music that was energizing for them.  Patients discussed how this knowledge can help with wellness and recovery in various ways including managing depression and anxiety as well as encouraging healthy sleep habits.    Therapeutic Goals: 1. Patients will explore the impact of different varieties of music on mood 2. Patients will verbalize the thoughts they have when listening to different types of music 3. Patients will identify music that is soothing to them as well as music that is energizing to them 4. Patients will discuss how to use this knowledge to assist in maintaining wellness and recovery 5. Patients will explore the use of music as a coping skill  Summary of Patient Progress:  N/A  Therapeutic Modalities: Solution Focused Brief Therapy Activity   Draydon Clairmont Grossman-Orr, LCSW    

## 2017-08-25 NOTE — Progress Notes (Signed)
Recreation Therapy Notes  08/25/17 0910:  LRT spoke with pt in dayroom and asked if she was willing to do her recreation therapy assessment.  Pt declined stating "I'm good".  LRT will follow up with pt.   Caroll RancherMarjette Wretha Laris, LRT/CTRS    Caroll RancherLindsay, Saliyah Gillin A 08/25/2017 12:32 PM

## 2017-08-25 NOTE — Progress Notes (Signed)
Pt has needed some re-direction when pt started to become inappropriate, but pt did comply with request

## 2017-08-25 NOTE — Progress Notes (Signed)
Patient ID: Lorraine Bryant, female   DOB: 07/16/1986, 31 y.o.   MRN: [id number] 1:1  Pt at this time is in bed resting with eyes closed. Pt does not look to be in any distress at this time. 1:1 staff is present in room with Pt at this time. 1:1 monitoring continues for Pt's safety. 15-minute safety checks also continues at this time.   

## 2017-08-25 NOTE — [facility] Group Notes (Signed)
LCSW Group Therapy Note  08/25/2017 1:15pm  Type of Therapy/Topic:  Group Therapy:  Balance in Life  Participation Level:  Active  Description of Group:    This group will address the concept of balance and how it feels and looks when one is unbalanced. Patients will be encouraged to process areas in their lives that are out of balance and identify reasons for remaining unbalanced. Facilitators will guide patients in utilizing problem-solving interventions to address and correct the stressor making their life unbalanced. Understanding and applying boundaries will be explored and addressed for obtaining and maintaining a balanced life. Patients will be encouraged to explore ways to assertively make their unbalanced needs known to significant others in their lives, using other group members and facilitator for support and feedback.  Therapeutic Goals: 1. Patient will identify two or more emotions or situations they have that consume much of in their lives. 2. Patient will identify signs/triggers that life has become out of balance:  3. Patient will identify two ways to set boundaries in order to achieve balance in their lives:  4. Patient will demonstrate ability to communicate their needs through discussion and/or role plays  Summary of Patient Progress:  Came in late.  Stated she is balanced and knows this because she is "exhilerated." Stated furthermore that she is "motivated" and working on getting organized.  When needing to find balance, she dances.    Therapeutic Modalities:   Cognitive Behavioral Therapy Solution-Focused Therapy Assertiveness Training  Lorraine Bryant, [place] 08/25/2017 5:11 PM

## 2017-08-25 NOTE — Progress Notes (Signed)
Adult Psychoeducational Group Note  Date:  08/25/2017 Time:  8:59 PM  Group Topic/Focus:  Wrap-Up Group:   The focus of this group is to help patients review their daily goal of treatment and discuss progress on daily workbooks.  Participation Level:  Active  Participation Quality:  Redirectable  Affect:  Labile  Cognitive:  Oriented  Insight: Limited  Engagement in Group:  Engaged  Modes of Intervention:  Socialization and Support  Additional Comments:  Patient attended and participated in group tonight. She reports having a good day. She spoke with her doctor and the plan is for her to leave tomorrow. She [place] cousin [place], however, she does not have this cousin telephone number. She stated the cousin does not have a number. This Clinical research associatewriter advised her to speak with her Child psychotherapistocial Worker.  Lita MainsFrancis, Romeo Zielinski [facility] Center[facility] 08/25/2017, 8:59 PM
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
## 2017-08-25 NOTE — Progress Notes (Signed)
Nursing 1:1 note D:Pt observed sleeping in bed with eyes closed. RR even and unlabored. No distress noted. A: 1:1 observation continues for safety  R: pt remains safe  

## 2017-08-25 NOTE — Progress Notes (Signed)
Recreation Therapy Notes  Date: 08/25/17 Time: 1000 Location: 500 Hall Dayroom  Group Topic: Communication  Goal Area(s) Addresses:  Patient will effectively communicate with peers in group.  Patient will verbalize benefit of healthy communication. Patient will verbalize positive effect of healthy communication on post d/c goals.  Patient will identify communication techniques that made activity effective for group.   Intervention: Geometrical pictures, pencils, blank paper  Activity: Back to Back Drawings.  Each patient was given an opportunity to describe a picture to their peers.  The peers listening were to draw the picture as it was being described by the person talking.  Patients were unable to ask any clarifying questions of the person speaking.  Education: Communication, Discharge Planning  Education Outcome: Acknowledges understanding/In group clarification offered/Needs additional education.   Clinical Observations/Feedback: Pt did not attend group.    Caroll RancherMarjette Keyona Emrich, LRT/CTRS        Caroll RancherLindsay, Clemence Lengyel A 08/25/2017 12:30 PM

## 2017-08-25 NOTE — Progress Notes (Signed)
1:1 Note Pt is the room lying in bed resting, no behavioral problem reported or observed. Remains on 1:1 for safety, will continue to monitor.

## 2017-08-25 NOTE — Progress Notes (Signed)
Patient ID: Lorraine Bryant, female   DOB: 11/10/1985, 32 y.o.   MRN: [id number] 1:1  Pt at this time is in bed resting with eyes closed. Pt does not look to be in any distress at this time. 1:1 staff is present in room with Pt at this time. 1:1 monitoring continues for Pt's safety. 15-minute safety checks also continues at this time.
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
## 2017-08-25 NOTE — Plan of Care (Signed)
  Safety: Ability to redirect hostility and anger into socially appropriate behaviors will improve 08/25/2017 2149 - Progressing by Delos HaringPhillips, Christiann Hagerty A, RN Note Pt safe on the unit at this time

## 2017-08-25 NOTE — Progress Notes (Signed)
Pt mother came to visit, pt was asked if she wanted to see her and pt stated " no" . So, pt mother was not allowed on the unit to visit this evening.

## 2017-08-25 NOTE — Progress Notes (Signed)
1:1 Note Pt has been observed dancing in the dayroom with peers. Pt ate lunch, not problems observed or reported. Remains on 1:1 for safety, will continue to monitor.

## 2017-08-26 MED ORDER — HALOPERIDOL 5 MG PO TABS
10.0000 mg | ORAL_TABLET | Freq: Every day | ORAL | Status: DC
  Administered 2017-08-26 – 2017-08-27 (×2): 10 mg via ORAL
  Filled 2017-08-26 (×3): qty 2

## 2017-08-26 NOTE — Progress Notes (Signed)
Patient discontinued from 1:1.  Patient continues to maintain safety behaviors and has presented with no behavioral dyscontrol.  Patient needs minimal redirection without 1:1 supervision.  

## 2017-08-26 NOTE — Plan of Care (Signed)
  Activity: Interest or engagement in activities will improve 08/26/2017 2005 - Progressing by Delos HaringPhillips, Margarita Bobrowski A, RN Note Pt visible on milieu this evening interacting with peers/ staff   Activity: Sleeping patterns will improve 08/26/2017 2005 - Progressing by Delos HaringPhillips, Kinjal Neitzke A, RN Note Pt slept over 6 hrs last night   Education: Emotional status will improve 08/26/2017 2005 - Progressing by Delos HaringPhillips, Korin Setzler A, RN Note Pt stated she felt superfluous

## 2017-08-26 NOTE — Progress Notes (Signed)
Nursing 1:1 note D:Pt observed sleeping in bed with eyes closed. RR even and unlabored. No distress noted. A: 1:1 observation continues for safety  R: pt remains safe  

## 2017-08-26 NOTE — Progress Notes (Signed)
Recreation Therapy Notes  Date: 08/26/17 Time: 1000 Location: 500 Hall Dayroom  Group Topic: Self-Expression  Goal Area(s) Addresses:  Patient will successfully identify positive attributes about themselves.  Patient will successfully identify benefit of improved self-expression.   Behavioral Response: Engaged  Intervention: Black or brown Scientist, clinical (histocompatibility and immunogenetics)construction paper, black paint, crayons, paint brushes, straws  Activity: Scratch Art.  Patients were given a sheet of construction paper.  Patients were to take the crayons and color the paper with any colors, designs and shapes they chose.  Patients would then take the black paint and paint over their picture.  Lastly, patients would take a straw and scrap off the paint in any design they chose to reveal the colors underneath.   Education:  Self-Esteem, Building control surveyorDischarge Planning.   Education Outcome: Acknowledges education/In group clarification offered/Needs additional education  Clinical Observations/Feedback: Pt was appropriate and focused during activity.  Pt was able to stay on task and complete her art without any inappropriate behavior.  Pt listened to the music and was engaged during activity.    Caroll RancherMarjette Tamyra Fojtik, LRT/CTRS    Caroll RancherLindsay, Kamran Coker A 08/26/2017 11:43 AM

## 2017-08-26 NOTE — [facility] Group Notes (Signed)
Pt attended group. When ask how her day was pt stated is was excellent. It was a Actor10 Writer went over the rules of the unit.
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
## 2017-08-26 NOTE — Progress Notes (Signed)
Patient remains on 1:1.  1:1 staff in close proximity to patient.  Patient has been in no behavioral dyscontrol and is not a danger to self/others.  Continue to monitor as planned.

## 2017-08-26 NOTE — Progress Notes (Signed)
D: Pt denies SI/HI/AVH. Pt is pleasant and cooperative. Pt visible on the unit interacting with peers/ staff appropriately. Pt stated she was superfluous . "i'm feeling and doing better"  A: Pt was offered support and encouragement. Pt was given scheduled medications. Pt was encourage to attend groups. Q 15 minute checks were done for safety.   R:Pt attends groups and interacts well with peers and staff. Pt is taking medication. Pt has no complaints.Pt receptive to treatment and safety maintained on unit.

## 2017-08-26 NOTE — Progress Notes (Signed)
Patient discontinued from 1:1.  Patient continues to maintain safety behaviors and has presented with no behavioral dyscontrol.  Patient needs minimal redirection without 1:1 supervision.

## 2017-08-27 MED ORDER — TRAZODONE HCL 100 MG PO TABS: 100.0000 mg | ORAL_TABLET | Freq: Every evening | ORAL | Status: DC | PRN

## 2017-08-27 NOTE — Tx Team (Signed)
Interdisciplinary Treatment and Diagnostic Plan Update  08/27/2017 Time of Session: 9:16 AM  Lorraine Bryant MRN: [id number]  Principal Diagnosis: Schizophrenia, paranoid ([place])  Secondary Diagnoses: Principal Problem:   Schizophrenia, paranoid ([place])   Current Medications:  Current Facility-Administered Medications  Medication Dose Route Frequency Provider Last Rate Last Dose  . acetaminophen (TYLENOL) tablet 650 mg  650 mg Oral Q4H PRN Patrecia Pour, NP      . alum & mag hydroxide-simeth (MAALOX/MYLANTA) 200-200-20 MG/5ML suspension 30 mL  30 mL Oral Q4H PRN Patrecia Pour, NP      . benztropine (COGENTIN) tablet 0.5 mg  0.5 mg Oral BID PRN Pennelope Bracken, MD       Or  . benztropine mesylate (COGENTIN) injection 0.5 mg  0.5 mg Intramuscular BID PRN Pennelope Bracken, MD      . diphenhydrAMINE (BENADRYL) capsule 25 mg  25 mg Oral Q6H PRN Lindell Spar I, NP   25 mg at 08/23/17 1138   Or  . diphenhydrAMINE (BENADRYL) injection 25 mg  25 mg Intramuscular Q6H PRN Nwoko, Herbert Pun I, NP      . haloperidol (HALDOL) tablet 10 mg  10 mg Oral QHS Nwoko, Herbert Pun I, NP   10 mg at 08/26/17 2112  . haloperidol (HALDOL) tablet 5 mg  5 mg Oral BID Maris Berger T, MD   5 mg at 08/27/17 0730  . haloperidol (HALDOL) tablet 5 mg  5 mg Oral Q8H PRN Pennelope Bracken, MD   5 mg at 08/23/17 1138   Or  . haloperidol lactate (HALDOL) injection 5 mg  5 mg Intramuscular Q8H PRN Pennelope Bracken, MD      . hydrOXYzine (ATARAX/VISTARIL) tablet 25 mg  25 mg Oral BID Patrecia Pour, NP   25 mg at 08/27/17 0730  . LORazepam (ATIVAN) tablet 1 mg  1 mg Oral Q6H PRN Pennelope Bracken, MD   1 mg at 08/26/17 2111   Or  . LORazepam (ATIVAN) injection 1 mg  1 mg Intramuscular Q6H PRN Pennelope Bracken, MD      . magnesium hydroxide (MILK OF MAGNESIA) suspension 30 mL  30 mL Oral Daily PRN Patrecia Pour, NP      . ondansetron [facility]) tablet 4 mg  4 mg Oral Q8H PRN  Patrecia Pour, NP      . traZODone (DESYREL) tablet 50 mg  50 mg Oral QHS PRN Pennelope Bracken, MD   50 mg at 08/26/17 2111    PTA Medications: Medications Prior to Admission  Medication Sig Dispense Refill Last Dose  . ARIPiprazole (ABILIFY) 10 MG tablet Take 10 mg by mouth daily.   08/20/2017 at Unknown time  . ARIPiprazole ER (ABILIFY MAINTENA) 400 MG PRSY Inject 400 mg into the muscle every 30 (thirty) days.   Past Month at Unknown time  . carbamazepine (TEGRETOL XR) 200 MG 12 hr tablet Take 200 mg by mouth 2 (two) times daily.   08/20/2017 at 10pm  . hydrOXYzine (ATARAX/VISTARIL) 25 MG tablet Take 25 mg by mouth 2 (two) times daily.    08/20/2017 at Unknown time  . LORazepam (ATIVAN) 1 MG tablet Take 1 mg by mouth 2 (two) times daily.    08/20/2017 at Unknown time    Patient Stressors: Financial difficulties Medication change or noncompliance  Patient Strengths: General fund of knowledge Physical Health Supportive family/friends  Treatment Modalities: Medication Management, Group therapy, Case management,  1 to 1 session with clinician, Psychoeducation, Recreational  therapy.   Physician Treatment Plan for Primary Diagnosis: Schizophrenia, paranoid ([place]) Long Term Goal(s): Improvement in symptoms so as ready for discharge  Short Term Goals: Compliance with prescribed medications will improve Ability to demonstrate self-control will improve  Medication Management: Evaluate patient's response, side effects, and tolerance of medication regimen.  Therapeutic Interventions: 1 to 1 sessions, Unit Group sessions and Medication administration.  Evaluation of Outcomes: Progressing   1/9: Pt remains some what delusional & paranoid and seem to have poor insight about her symptoms. She agrees to continue current regimen of haldol with addition of 10 mg QHS for adequate mood control.   -Schizophrenia - Continue haldol 37m po BID.             - Initiated Haldol 10 mg po  Q hs   -EPS - Continue cogentin 0.585mpo/IM BID prn EPS  - Anxiety - Continue atarax 2562mo q6h prn anxiety  -Insomnia - Continue trazodone 68m34m qhs prn insomnia  -Agitation - Continue Haldol 5mg 61mIM q8h prn agitation/severe psychosis and start ativan 1mg p49mM q8h prn agitation/severe psychosis.              - Discontinue the 1:1 supervision.    Physician Treatment Plan for Secondary Diagnosis: Principal Problem:   Schizophrenia, paranoid (HCC)  [place] Term Goal(s): Improvement in symptoms so as ready for discharge  Short Term Goals: Compliance with prescribed medications will improve Ability to demonstrate self-control will improve  Medication Management: Evaluate patient's response, side effects, and tolerance of medication regimen.  Therapeutic Interventions: 1 to 1 sessions, Unit Group sessions and Medication administration.  Evaluation of Outcomes: Progressing   RN Treatment Plan for Primary Diagnosis: Schizophrenia, paranoid (HCC) [place] Term Goal(s): Knowledge of disease and therapeutic regimen to maintain health will improve  Short Term Goals: Ability to identify and develop effective coping behaviors will improve and Compliance with prescribed medications will improve  Medication Management: RN will administer medications as ordered by provider, will assess and evaluate patient's response and provide education to patient for prescribed medication. RN will report any adverse and/or side effects to prescribing provider.  Therapeutic Interventions: 1 on 1 counseling sessions, Psychoeducation, Medication administration, Evaluate responses to treatment, Monitor vital signs and CBGs as ordered, Perform/monitor CIWA, COWS, AIMS and Fall Risk screenings as ordered, Perform wound care treatments as ordered.  Evaluation of Outcomes: Progressing   LCSW Treatment Plan for Primary Diagnosis: Schizophrenia, paranoid  (HCC) [place] Term Goal(s): Safe transition to appropriate next level of care at discharge, Engage patient in therapeutic group addressing interpersonal concerns.  Short Term Goals: Engage patient in aftercare planning with referrals and resources  Therapeutic Interventions: Assess for all discharge needs, 1 to 1 time with Social worker, Explore available resources and support systems, Assess for adequacy in community support network, Educate family and significant other(s) on suicide prevention, Complete Psychosocial Assessment, Interpersonal group therapy.  Evaluation of Outcomes: Met  Return home, follow up [facility]   Progress in Treatment: Attending groups: Yes Participating in groups: Yes Taking medication as prescribed: Yes Toleration medication: Yes, no side effects reported at this time Family/Significant other contact made: No Patient understands diagnosis: No  Limited insight Discussing patient identified problems/goals with staff: Yes Medical problems stabilized or resolved: Yes Denies suicidal/homicidal ideation: Yes Issues/concerns per patient self-inventory: None Other: N/A  New problem(s) identified: None identified at this time.   New Short Term/Long Term Goal(s): "I want help with love, peace and true happiness"  Discharge Plan or Barriers:  Reason for Continuation of Hospitalization: Disorganization Paranoia Mood Lability Medication stabilization   Estimated Length of Stay: 1/14  Attendees: Patient:  08/27/2017  9:16 AM  Physician: Maris Berger, MD 08/27/2017  9:16 AM  Nursing: Sena Hitch, RN 08/27/2017  9:16 AM  RN Care Manager: Lars Pinks, RN 08/27/2017  9:16 AM  Social Worker: Ripley Fraise 08/27/2017  9:16 AM  Recreational Therapist: Winfield Cunas 08/27/2017  9:16 AM  Other: Norberto Sorenson 08/27/2017  9:16 AM  Other:  08/27/2017  9:16 AM    Scribe for Treatment Team:  Roque Lias LCSW 08/27/2017 9:16 AM

## 2017-08-27 NOTE — [facility] Group Notes (Signed)
LCSW Group Therapy Note   08/27/2017 1:15pm   Type of Therapy and Topic:  Group Therapy:  Overcoming Obstacles   Participation Level:  Active   Description of Group:    In this group patients will be encouraged to explore what they see as obstacles to their own wellness and recovery. They will be guided to discuss their thoughts, feelings, and behaviors related to these obstacles. The group will process together ways to cope with barriers, with attention given to specific choices patients can make. Each patient will be challenged to identify changes they are motivated to make in order to overcome their obstacles. This group will be process-oriented, with patients participating in exploration of their own experiences as well as giving and receiving support and challenge from other group members.   Therapeutic Goals: 1. Patient will identify personal and current obstacles as they relate to admission. 2. Patient will identify barriers that currently interfere with their wellness or overcoming obstacles.  3. Patient will identify feelings, thought process and behaviors related to these barriers. 4. Patient will identify two changes they are willing to make to overcome these obstacles:      Summary of Patient Progress  Stayed the entire time, engaged throughout.  Was based in reality for much of the discussion as she talked of her goal to graduate from school, and her plan for working with her professors about work that she will have missed while she was in the hospital.  At one point got focused again on [place].     Therapeutic Modalities:   Cognitive Behavioral Therapy Solution Focused Therapy Motivational Interviewing Relapse Prevention Therapy  Ida RogueRodney B Dhanush Jokerst, LCSW 08/27/2017 3:49 PM
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
## 2017-08-27 NOTE — Progress Notes (Signed)
D: Pt denies SI/HI/AVH. Pt is pleasant and cooperative. Pt has needed little re-direction this evening, pt still very euphoric with her presentation.   A: Pt was offered support and encouragement. Pt was given scheduled medications. Pt was encourage to attend groups. Q 15 minute checks were done for safety.   R:Pt attends groups and interacts well with peers and staff. Pt is taking medication. Pt has no complaints.Pt receptive to treatment and safety maintained on unit.

## 2017-08-27 NOTE — Progress Notes (Signed)
Patient denies SI, HI and AVH.  Patient has been making bizarre statements toward staff. Patient needed much redirection.   Assess patient for safety, offer medications as prescribed, engage patient for 1:1 staff talks.   Continue to monitor as planned.
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
## 2017-08-27 NOTE — Progress Notes (Signed)
Adult Psychoeducational Group Note  Date:  08/27/2017 Time:  9:11 PM  Group Topic/Focus:  Wrap-Up Group:   The focus of this group is to help patients review their daily goal of treatment and discuss progress on daily workbooks.  Participation Level:  Active  Participation Quality:  Intrusive  Affect:  Excited  Cognitive:  Delusional  Insight: Lacking  Engagement in Group:  Engaged  Modes of Intervention:  Socialization and Support  Additional Comments:  Patient attended and participated in group tonight. She reports feeling super. Today she went for her meals, nap, watched television, went to the gym and spoke with her doctor. She advised that she will be leaving tomorrow. She has not made a plan [place] to make a plan.  Lita MainsFrancis, Manases Etchison [facility] 08/27/2017, 9:11 PM

## 2017-08-27 NOTE — Plan of Care (Signed)
  Activity: Sleeping patterns will improve 08/27/2017 2058 - Progressing by Delos HaringPhillips, Aradia Estey A, RN Note Pt slept over 6 hours   Coping: Ability to demonstrate self-control will improve 08/27/2017 2058 - Progressing by Delos HaringPhillips, Chadley Dziedzic A, RN Note Pt has needed little re-direction on the unit this evening   Coping: Ability to cope will improve 08/27/2017 2058 - Progressing by Delos HaringPhillips, Cody Oliger A, RN Note Pt seen interacting with peers this evening

## 2017-08-28 MED ORDER — FLUPHENAZINE HCL 5 MG PO TABS
5.0000 mg | ORAL_TABLET | Freq: Two times a day (BID) | ORAL | Status: DC
  Administered 2017-08-28 – 2017-08-30 (×3): 5 mg via ORAL
  Filled 2017-08-28 (×7): qty 1

## 2017-08-28 NOTE — [facility] Group Notes (Signed)
Adult Psychoeducational Group Note  Date:  08/28/2017 Time:  9:01 PM  Group Topic/Focus:  Wrap-Up Group:   The focus of this group is to help patients review their daily goal of treatment and discuss progress on daily workbooks.  Participation Level:  Active  Participation Quality:  Appropriate and Attentive  Affect:  Appropriate  Cognitive:  Alert and Appropriate  Insight: Appropriate and Good  Engagement in Group:  Engaged  Modes of Intervention:  Discussion  Additional Comments:  Pt attended and participated in wrap up group. Pt rated their day a 9 because they spoke to their aunt and uncle. Pt goal was to be discharged, but the Dr is still working out a discharge plan for pt. A positive noted by the pt was "love, peace and serenity".   Lorraine NettersOctavia A [name] 08/28/2017, 9:01 PM

## 2017-08-28 NOTE — Progress Notes (Signed)
Recreation Therapy Notes  Date: 08/28/17 Time: 1000 Location: 500 Hall Dayroom   Group Topic: Communication, Team Building, Problem Solving  Goal Area(s) Addresses:  Patient will effectively work with peer towards shared goal.  Patient will identify skills used to make activity successful.  Patient will identify how skills used during activity can be used to reach post d/c goals.   Behavioral Response: Engaged  Intervention: STEM Activity  Activity: Stage managerLanding Pad. In teams patients were given 12 plastic drinking straws and a length of masking tape. Using the materials provided patients were asked to build a landing pad to catch a golf ball dropped from approximately 6 feet in the air.   Education: Pharmacist, communityocial Skills, Discharge Planning   Education Outcome: Acknowledges education/In group clarification offered/Needs additional education.   Clinical Observations/Feedback: Pt arrived late and was tearful after meeting with doctor because she didn't have any of her relatives contact information.  LRT was able to talk to pt to settle her down.  Pt was able to focus and complete activity.    Lorraine RancherMarjette Elmond Bryant, LRT/CTRS     Lorraine RancherLindsay, Lorraine Bryant A 08/28/2017 12:08 PM
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
## 2017-08-28 NOTE — [facility] Group Notes (Signed)
[facility]  08/28/2017 , 2:17 PM    Type of Therapy:  [facility] [employer] Presentation  Participation Level:  Active  Participation Quality:  Attentive  Affect:  Blunted  Cognitive:  Oriented  Insight:  Limited  Engagement in Therapy:  Engaged  Modes of Intervention:  Discussion, Education and Socialization  Summary of Progress/Problems:  Tammi  from [employer] came to present her recovery story, encourage group  members to share something about their story, and present information about the [facility].  Stayed the entire time, engaged throughout.  Had to be redirected for blurting out, and was able to comply.  Spoke at length about her "husband."  Daryel Geraldorth, Avion Kutzer B 08/28/2017 , 2:17 PM

## 2017-08-29 NOTE — [facility] Group Notes (Signed)
[facility] LCSW Group Therapy  08/29/2017  1:05 PM  Type of Therapy:  Group therapy  Participation Level:  Active  Participation Quality:  Attentive  Affect:  Flat  Cognitive:  Oriented  Insight:  Limited  Engagement in Therapy:  Limited  Modes of Intervention:  Discussion, Socialization  Summary of Progress/Problems:  Chaplain was here to lead a group on themes of hope and courage. "Hope is majestic."  Stated that her husband gives her hope.  Was grandiose, euphoric throughout, but did not require redirection or intervention.  Lorraine Bryant, Lorraine Bryant 08/29/2017 1:34 PM

## 2017-08-29 NOTE — Progress Notes (Signed)
Recreation Therapy Notes  Date: 08/29/17 Time: 1000 Location: 500 Hall Dayroom  Group Topic: Wellness  Goal Area(s) Addresses:  Patient will define components of whole wellness. Patient will verbalize benefit of whole wellness.  Behavioral Response: Engaged  Intervention: 2 Decks of Cards, music  Activity: Deck of Chance.  Each patient was given two cards from one deck.  LRT would pull a card from the second deck.  If LRT pulled either of the patients numbers, patients would have to do the exercise that corresponds with that number.    Education: Wellness, Building control surveyorDischarge Planning.   Education Outcome: Acknowledges education/In group clarification offered/Needs additional education.   Clinical Observations/Feedback: Pt arrived late but joined in on the activity.  Pt was coloring a picture baby mickey mouse in between her exercises.  At one point pt was speaking baby talk to the picture.  Pt was able to complete her exercises.    Caroll RancherMarjette Mallery Harshman, LRT/CTRS      Caroll RancherLindsay, Kito Cuffe A 08/29/2017 12:30 PM
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
## 2017-08-29 NOTE — Progress Notes (Signed)
Pt is gamey on the unit. She was dancing and aggravating other pt's on the unit. She was seen by staff putting her middle finger up to a peer on the unit. Pt required much redirection. Pt was not wearing an armband. When writer asked for her birthdate she gave a different date that the computer.Then [place]. Pt refused to take her 1700 dose of medication.

## 2017-08-29 NOTE — Progress Notes (Signed)
Patient ID: Lorraine Bryant, female   DOB: 12/19/1985, 32 y.o.   MRN: [id number] DAR NOTE: Pt remains very anxious, paranoid and confused. Pt refused all offered PRN medications "I don't need anything." Pt refused to answer any assessment questions. All patient's questions and concerns addressed. Support, encouragement, and safe environment provided. 15-minute safety checks continue. Safety checks continue.

## 2017-08-29 NOTE — Progress Notes (Signed)
D:Pt's mood is labile. Pt growled when MHT requested pt to come to the medication window this morning. Writer took medications to pt's room. Pt turned away from Clinical research associatewriter when Clinical research associatewriter tried to talk with her and give medications. When writer started to leave the room, pt turned to writer smiling and took her medications.  A:Offered support, encouragement and 15 minute checks.  R:Pt verbally denies si and hi. Safety maintained on the unit.

## 2017-08-30 DIAGNOSIS — R451 Restlessness and agitation: Secondary | ICD-10-CM

## 2017-08-30 MED ORDER — FLUPHENAZINE HCL 5 MG PO TABS
5.0000 mg | ORAL_TABLET | Freq: Every day | ORAL | Status: DC
  Administered 2017-08-30 – 2017-09-01 (×3): 5 mg via ORAL
  Filled 2017-08-30 (×5): qty 1

## 2017-08-30 MED ORDER — FLUPHENAZINE HCL 10 MG PO TABS
10.0000 mg | ORAL_TABLET | Freq: Every day | ORAL | Status: DC
  Administered 2017-08-31: 10 mg via ORAL
  Filled 2017-08-30 (×3): qty 1
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
## 2017-08-30 NOTE — Progress Notes (Signed)
Patient ID: [name], female   DOB: 07/14/1986, 32 y.o.   MRN: [id number]   DAR NOTE: Pt remains very anxious, paranoid and confused. Pt refused all offered PRN medications. Pt denied any SI, HI, anxiety, depression or pain. All patient's questions and concerns addressed. Support, encouragement, and safe environment provided. 15-minute safety checks continue. Safety checks continue.

## 2017-08-30 NOTE — Progress Notes (Signed)
Writer has observed patient up in the dayroom watching tv and at times singing loudly while others were watching tv. When staff redirected her she continued until she was ready to stop singing. She has been upset with one MHT on the hall and whenever she walks by him or sees him she will given him the middle finger. She is very childlike, intrusive at times and when she does not get the response she wants from some of her behavior she will settle down. She did not request any hs prns. Safety maintained on unit with 15 min checks.

## 2017-08-30 NOTE — [facility] Group Notes (Signed)
[facility] LCSW Group Therapy Note  Date/Time:    08/30/2017   11:30 AM - 12:00 PM  Type of Therapy and Topic:  Group Therapy:  Thought Distortions  Participation Level:  Minimal   Description of Group:  In this group, patients were asked to describe the last time they got upset and what their thinking about the situation was at that time.  They were then introduced to cognitive distortions and discussed how these can negatively affect their lives.  This included only a few cognitive distortion types, including  All or Nothing thinking, Focusing on the Negative, Blaming, Predicting the Future and Overgeneralization.  Therapeutic Goals: 1. Patient will be able to identify this exercise of examining their thoughts as a healthy coping mechanism. 2. Patient will identify a problem area in their life and the automatic thoughts they have about that situation. 3. Patient will verbalize the feelings and outcomes typically associated with their thoughts. 4. Patient will think about and discuss other more realistic conclusions/thoughts that could be more helpful.  Summary of Patient Progress:  The patient expressed initially that she would not get off the phone in the dayroom in order for group to take place.  She then sat in the middle of the room on the arm of her chair instead of in the chair itself, and had side comments to make frequently that were off topic or inappropriate.  She frequently interjected statements such as "my son, my daughter" while pointing at different group members, then pointed at one person and made a face, said, "Not," then did the same thing pointing at the CSW.  She said she is "never upset, never happy, always just elated."  She remained smiling and "elated" throughout group.   Therapeutic Modalities Cognitive Behavioral Therapy  Ambrose MantleMareida Grossman-Orr, LCSW 08/30/2017 12:00PM

## 2017-08-30 NOTE — Progress Notes (Addendum)
D. Pt presents with an animated affect- fixed smile with some bizarre, intrusive behavior at times. Pt has been pleasant during most interactions, delusional at times, calling a staff member "a demon" at one point. Pt observed attending group and sitting in dayroom watching tv. Pt currently denies SI/HI and AVH  A. Labs and vitals monitored. Pt compliant with medications. Pt supported emotionally and encouraged to express concerns and ask questions.   R. Pt remains safe with 15 minute checks. Will continue POC.

## 2017-08-31 NOTE — [facility] Group Notes (Signed)
[facility]  Date/Time:  08/31/2017  11:00AM-12:00PM  Type of Therapy and Topic:  Group Therapy:  Music and Mood  Participation Level:  Active   Description of Group: In this process group, members listened to a variety of genres of music and identified that different types of music evoke different responses.  Patients were encouraged to identify music that was soothing for them and music that was energizing for them.  Patients discussed how this knowledge can help with wellness and recovery in various ways including managing depression and anxiety as well as encouraging healthy sleep habits.    Therapeutic Goals: 1. Patients will explore the impact of different varieties of music on mood 2. Patients will verbalize the thoughts they have when listening to different types of music 3. Patients will identify music that is soothing to them as well as music that is energizing to them 4. Patients will discuss how to use this knowledge to assist in maintaining wellness and recovery 5. Patients will explore the use of music as a coping skill  Summary of Patient Progress:  At the beginning of group, patient expressed that she felt "Excited" and at the end of group, during which she smiled and danced a great deal, she stated she felt "exhilirated."  Therapeutic Modalities: Solution Focused Brief Therapy Activity   Lorraine MantleMareida Grossman-Orr, LCSW

## 2017-08-31 NOTE — Progress Notes (Signed)
[place] MD Progress Note  08/31/2017 8:24 AM Lorraine Bryant  MRN:  [id number]   Subjective: Lorraine Bryant reports " Have you spoken to my grandparents yet? I am ready to leave."   Objective: Lorraine Bryant seen resting in dayroom.Continues to deny suicidal or homicidal ideation during this assessment.Contiune to reports she is missing school assignments and feels ready to discharge.  Reports taken her medications as prescribed and states she is tolerating medications well.  Patient was encouraged to have repeat labs and vitals collected. Lorraine Bryant continues to be intrusive with poor boundaries.  Reports by nursing staff patient continues to be labile and irritable. Lorraine Bryant denies auditory or visual hallucination. Reports good appetite and states she is resting well. States " I am just ready to go." NP placed orders for routine labs  prolactin level, CBC, CMPand EKG.  Support, encouragement and reassurance was provided.    Principal Problem: Schizophrenia, paranoid (HCC) Diagnosis:   Patient Active Problem List   Diagnosis Date Noted  . Schizophrenia, paranoid (HCC) [F20.0] 08/21/2017  . Paranoid schizophrenia (HCC) [F20.0] 07/15/2017   Total Time spent with patient: 30 minutes  Past Psychiatric History: see H&P  Past Medical History:  Past Medical History:  Diagnosis Date  . Depression   . Hallucinations   . Psychosis (HCC)    per pt and family last year    Past Surgical History:  Procedure Laterality Date  . NO PAST SURGERIES     Family History: History reviewed. No pertinent family history. Family Psychiatric  History: see H&P Social History:  Social History   Substance and Sexual Activity  Alcohol Use No     Social History   Substance and Sexual Activity  Drug Use No   Comment: unable to assess d/t pt not responding to questioning    Social History   Socioeconomic History  . Marital status: Single    Spouse name: None  . Number of children: None  . Years of  education: None  . Highest education level: None  Social Needs  . Financial resource strain: None  . Food insecurity - worry: None  . Food insecurity - inability: None  . Transportation needs - medical: None  . Transportation needs - non-medical: None  Occupational History  . None  Tobacco Use  . Smoking status: Never Smoker  . Smokeless tobacco: Never Used  Substance and Sexual Activity  . Alcohol use: No  . Drug use: No    Comment: unable to assess d/t pt not responding to questioning  . Sexual activity: No  Other Topics Concern  . None  Social History Narrative  . None   Additional Social History:                         Sleep: Good  Appetite:  Fair  Current Medications: Current Facility-Administered Medications  Medication Dose Route Frequency Provider Last Rate Last Dose  . acetaminophen (TYLENOL) tablet 650 mg  650 mg Oral Q4H PRN Charm Rings, NP      . alum & mag hydroxide-simeth (MAALOX/MYLANTA) 200-200-20 MG/5ML suspension 30 mL  30 mL Oral Q4H PRN Charm Rings, NP      . benztropine (COGENTIN) tablet 0.5 mg  0.5 mg Oral BID PRN Micheal Likens, MD       Or  . benztropine mesylate (COGENTIN) injection 0.5 mg  0.5 mg Intramuscular BID PRN Micheal Likens, MD      . diphenhydrAMINE (BENADRYL) capsule 25  mg  25 mg Oral Q6H PRN Armandina Stammer I, NP   25 mg at 08/23/17 1138   Or  . diphenhydrAMINE (BENADRYL) injection 25 mg  25 mg Intramuscular Q6H PRN Nwoko, Agnes I, NP      . fluPHENAZine (PROLIXIN) tablet 10 mg  10 mg Oral QHS Oneta Rack, NP      . fluPHENAZine (PROLIXIN) tablet 5 mg  5 mg Oral Daily Oneta Rack, NP   5 mg at 08/30/17 1035  . haloperidol (HALDOL) tablet 5 mg  5 mg Oral Q8H PRN Micheal Likens, MD   5 mg at 08/23/17 1138   Or  . haloperidol lactate (HALDOL) injection 5 mg  5 mg Intramuscular Q8H PRN Micheal Likens, MD      . hydrOXYzine (ATARAX/VISTARIL) tablet 25 mg  25 mg Oral BID Charm Rings, NP   25 mg at 08/30/17 0931  . LORazepam (ATIVAN) tablet 1 mg  1 mg Oral Q6H PRN Micheal Likens, MD   1 mg at 08/27/17 2121   Or  . LORazepam (ATIVAN) injection 1 mg  1 mg Intramuscular Q6H PRN Micheal Likens, MD      . magnesium hydroxide (MILK OF MAGNESIA) suspension 30 mL  30 mL Oral Daily PRN Charm Rings, NP      . ondansetron [facility]) tablet 4 mg  4 mg Oral Q8H PRN Charm Rings, NP      . traZODone (DESYREL) tablet 100 mg  100 mg Oral QHS PRN Kerry Hough, PA-C        Lab Results: No results found for this or any previous visit (from the past 48 hour(s)).  Blood Alcohol level:  Lab Results  Component Value Date   ETH <10 08/21/2017   ETH <10 07/15/2017    Metabolic Disorder Labs: No results found for: HGBA1C, MPG No results found for: PROLACTIN No results found for: CHOL, TRIG, HDL, CHOLHDL, VLDL, LDLCALC  Physical Findings: AIMS: Facial and Oral Movements Muscles of Facial Expression: None, normal Lips and Perioral Area: None, normal Jaw: None, normal Tongue: None, normal,Extremity Movements Upper (arms, wrists, hands, fingers): None, normal Lower (legs, knees, ankles, toes): None, normal, Trunk Movements Neck, shoulders, hips: None, normal, Overall Severity Severity of abnormal movements (highest score from questions above): None, normal Incapacitation due to abnormal movements: None, normal Patient's awareness of abnormal movements (rate only patient's report): No Awareness, Dental Status Current problems with teeth and/or dentures?: No Does patient usually wear dentures?: No  CIWA:    COWS:     Musculoskeletal: Strength & Muscle Tone: within normal limits Gait & Station: normal Patient leans: N/A  Psychiatric Specialty Exam: Physical Exam  Nursing note and vitals reviewed. Constitutional: She appears well-developed.  Cardiovascular: Normal rate.  Skin: Skin is warm and dry.  Psychiatric: She has a normal mood and  affect.    Review of Systems  Psychiatric/Behavioral: Negative for depression, hallucinations and suicidal ideas. The patient is not nervous/anxious and does not have insomnia.   All other systems reviewed and are negative.   Resp. rate 18, height 5\' 3"  (1.6 m), weight 101.2 kg (223 lb).Body mass index is 39.5 kg/m.  General Appearance: Casual and Fairly Groomed bright and pleasant   Eye Contact:  Good  Speech:  Clear and Coherent and Normal Rate  Volume:  Normal  Mood:  Anxious and Irritable  Affect:  Labile  Thought Process:  Coherent, Goal Directed and Descriptions of Associations: Loose  Orientation:  Full (Time, Place, and Person)  Thought Content:  Delusions, Ideas of Reference:   Paranoia Delusions, Paranoid Ideation, Rumination and Abstract Reasoning  Suicidal Thoughts:  No  Homicidal Thoughts:  No  Memory:  Immediate;   Fair Recent;   Fair Remote;   Fair  Judgement:  Poor  Insight:  Lacking  Psychomotor Activity:  Normal  Concentration:  Concentration: Fair  Recall:  F[employer]Fair  Fund of Knowledge:  Fair  Language:  Fair  Akathisia:  No  Handed:    AIMS (if indicated):     Assets:  Communication Skills Leisure Time Physical Health Resilience  ADL's:  Intact  Cognition:  WNL  Sleep:  Number of Hours: 6.25     Treatment Plan Summary: Daily contact with patient to assess and evaluate symptoms and progress in treatment and Medication management.   Continue current treatment plan on 08/31/2016 except wherenoted  -Schizophrenia  -Continue prolixin 5mg  po BID to 5 mg AM and 10 mg QHS  -EPS -Continuecogentin 0.5mg  po/IM BID prn EPS  - Anxiety -Continueatarax 25mg  po q6h prn anxiety  -Insomnia -Continuetrazodone 50mg  po qhs prn insomnia  -Agitation -ContinueHaldol 5mg  po/IM q8h prn agitation/severe psychosis and start ativan 1mg  po/IM q8h prn agitation/severe psychosis  - Encourage  participation in groups and the therapeutic milieu -Discharge planning will be ongoing  Oneta Rackanika N Akasia Ahmad, NP 08/31/2017, 8:24 AM

## 2017-08-31 NOTE — Progress Notes (Addendum)
D. Pt resting in bed upon initial approach. Pt presents with animated affect- fixed smile- no behavior issues on unit at this time- patient still refusing for staff to take vitals and is refusing an EKG to be performed. Pt currently denies SI/HI and AVH  A. Labs and vitals monitored. Pt compliant with medications. Pt supported emotionally and encouraged to express concerns and ask questions.   R. Pt remains safe with 15 minute checks. Will continue POC.

## 2017-09-01 MED ORDER — HYDROXYZINE HCL 25 MG PO TABS: 25.0000 mg | ORAL_TABLET | Freq: Four times a day (QID) | ORAL | Status: DC | PRN

## 2017-09-01 NOTE — Progress Notes (Signed)
Did not attend group 

## 2017-09-01 NOTE — Progress Notes (Signed)
D: Pt denies SI/HI/AVH. Pt is pleasant, pt stated she was great, "supervolous" , pt continues to refuse vitals, EKG, bloodwork A: Pt was offered support and encouragement. Pt was given scheduled medications. Pt was encourage to attend groups. Q 15 minute checks were done for safety.   R:Pt attends groups and interacts well with peers and staff. Pt is taking medication. Pt receptive to treatment and safety maintained on unit.

## 2017-09-01 NOTE — Tx Team (Signed)
Interdisciplinary Treatment and Diagnostic Plan Update  09/01/2017 Time of Session: 10:40 AM  Lorraine Bryant MRN: [id number]  Principal Diagnosis: Schizophrenia, paranoid ([place])  Secondary Diagnoses: Principal Problem:   Schizophrenia, paranoid ([place])   Current Medications:  Current Facility-Administered Medications  Medication Dose Route Frequency Provider Last Rate Last Dose  . acetaminophen (TYLENOL) tablet 650 mg  650 mg Oral Q4H PRN Patrecia Pour, NP      . alum & mag hydroxide-simeth (MAALOX/MYLANTA) 200-200-20 MG/5ML suspension 30 mL  30 mL Oral Q4H PRN Patrecia Pour, NP      . benztropine (COGENTIN) tablet 0.5 mg  0.5 mg Oral BID PRN Pennelope Bracken, MD       Or  . benztropine mesylate (COGENTIN) injection 0.5 mg  0.5 mg Intramuscular BID PRN Pennelope Bracken, MD      . diphenhydrAMINE (BENADRYL) capsule 25 mg  25 mg Oral Q6H PRN Lindell Spar I, NP   25 mg at 08/23/17 1138   Or  . diphenhydrAMINE (BENADRYL) injection 25 mg  25 mg Intramuscular Q6H PRN Nwoko, Herbert Pun I, NP      . fluPHENAZine (PROLIXIN) tablet 10 mg  10 mg Oral QHS Derrill Center, NP   10 mg at 08/31/17 2113  . fluPHENAZine (PROLIXIN) tablet 5 mg  5 mg Oral Daily Derrill Center, NP   5 mg at 09/01/17 0916  . haloperidol (HALDOL) tablet 5 mg  5 mg Oral Q8H PRN Pennelope Bracken, MD   5 mg at 08/23/17 1138   Or  . haloperidol lactate (HALDOL) injection 5 mg  5 mg Intramuscular Q8H PRN Pennelope Bracken, MD      . hydrOXYzine (ATARAX/VISTARIL) tablet 25 mg  25 mg Oral BID Patrecia Pour, NP   25 mg at 09/01/17 0916  . LORazepam (ATIVAN) tablet 1 mg  1 mg Oral Q6H PRN Pennelope Bracken, MD   1 mg at 08/27/17 2121   Or  . LORazepam (ATIVAN) injection 1 mg  1 mg Intramuscular Q6H PRN Pennelope Bracken, MD      . magnesium hydroxide (MILK OF MAGNESIA) suspension 30 mL  30 mL Oral Daily PRN Patrecia Pour, NP      . ondansetron [facility]) tablet 4 mg  4 mg Oral Q8H PRN  Patrecia Pour, NP      . traZODone (DESYREL) tablet 100 mg  100 mg Oral QHS PRN Laverle Hobby, PA-C        PTA Medications: Medications Prior to Admission  Medication Sig Dispense Refill Last Dose  . ARIPiprazole (ABILIFY) 10 MG tablet Take 10 mg by mouth daily.   08/20/2017 at Unknown time  . ARIPiprazole ER (ABILIFY MAINTENA) 400 MG PRSY Inject 400 mg into the muscle every 30 (thirty) days.   Past Month at Unknown time  . carbamazepine (TEGRETOL XR) 200 MG 12 hr tablet Take 200 mg by mouth 2 (two) times daily.   08/20/2017 at 10pm  . hydrOXYzine (ATARAX/VISTARIL) 25 MG tablet Take 25 mg by mouth 2 (two) times daily.    08/20/2017 at Unknown time  . LORazepam (ATIVAN) 1 MG tablet Take 1 mg by mouth 2 (two) times daily.    08/20/2017 at Unknown time    Patient Stressors: Financial difficulties Medication change or noncompliance  Patient Strengths: General fund of knowledge Physical Health Supportive family/friends  Treatment Modalities: Medication Management, Group therapy, Case management,  1 to 1 session with clinician, Psychoeducation, Recreational therapy.  Physician Treatment Plan for Primary Diagnosis: Schizophrenia, paranoid ([place]) Long Term Goal(s): Improvement in symptoms so as ready for discharge  Short Term Goals: Compliance with prescribed medications will improve Ability to demonstrate self-control will improve  Medication Management: Evaluate patient's response, side effects, and tolerance of medication regimen.  Therapeutic Interventions: 1 to 1 sessions, Unit Group sessions and Medication administration.  Evaluation of Outcomes: Progressing   1/9: Pt remains some what delusional & paranoid and seem to have poor insight about her symptoms. She agrees to continue current regimen of haldol with addition of 10 mg QHS for adequate mood control.   -Schizophrenia - Continue haldol 11m po BID.             - Initiated Haldol 10 mg po Q hs    -EPS - Continue cogentin 0.561mpo/IM BID prn EPS  - Anxiety - Continue atarax 2567mo q6h prn anxiety  -Insomnia - Continue trazodone 87m12m qhs prn insomnia  -Agitation - Continue Haldol 5mg 81mIM q8h prn agitation/severe psychosis and start ativan 1mg p6mM q8h prn agitation/severe psychosis.              - Discontinue the 1:1 supervision.  1/14:  Patient continues to reports that her "fake mother" is calling to this hospital and making her stay longer. Patient provided NP with a phone [place]  prol[place]in 5mg po35mD to 5 mg AM and 10 mg QHS  -EPS -Continuecogentin 0.5mg po/73mBID prn EPS  - Anxiety -Continueatarax 25mg po 3mprn anxiety  -Insomnia -Continuetrazodone 87mg po q58mrn insomnia      Physician Treatment Plan for Secondary Diagnosis: Principal Problem:   Schizophrenia, paranoid (HCC)   [place] Improvement in symptoms so as ready for discharge  Short Term Goals: Compliance with prescribed medications will improve Ability to demonstrate self-control will improve  Medication Management: Evaluate patient's response, side effects, and tolerance of medication regimen.  Therapeutic Interventions: 1 to 1 sessions, Unit Group sessions and Medication administration.  Evaluation of Outcomes: Progressing   RN Treatment Plan for Primary Diagnosis: Schizophrenia, paranoid (HCC) [place] Goal(s): Knowledge of disease and therapeutic regimen to maintain health will improve  Short Term Goals: Ability to identify and develop effective coping behaviors will improve and Compliance with prescribed medications will improve  Medication Management: RN will administer medications as ordered by provider, will assess and evaluate patient's response and provide education to patient for prescribed medication. RN  will report any adverse and/or side effects to prescribing provider.  Therapeutic Interventions: 1 on 1 counseling sessions, Psychoeducation, Medication administration, Evaluate responses to treatment, Monitor vital signs and CBGs as ordered, Perform/monitor CIWA, COWS, AIMS and Fall Risk screenings as ordered, Perform wound care treatments as ordered.  Evaluation of Outcomes: Progressing   LCSW Treatment Plan for Primary Diagnosis: Schizophrenia, paranoid (HCC) [place] Goal(s): Safe transition to appropriate next level of care at discharge, Engage patient in therapeutic group addressing interpersonal concerns.  Short Term Goals: Engage patient in aftercare planning with referrals and resources  Therapeutic Interventions: Assess for all discharge needs, 1 to 1 time with Social worker, Explore available resources and support systems, Assess for adequacy in community support network, Educate family and significant other(s) on suicide prevention, Complete Psychosocial Assessment, Interpersonal group therapy.  Evaluation of Outcomes: Met  Return home, follow up [facility]   Progress in Treatment: Attending groups: Yes Participating in groups: Yes Taking medication as prescribed:  Yes Toleration medication: Yes, no side effects reported at this time Family/Significant other contact made: No Patient understands diagnosis: No  Limited insight Discussing patient identified problems/goals with staff: Yes Medical problems stabilized or resolved: Yes Denies suicidal/homicidal ideation: Yes Issues/concerns per patient self-inventory: None Other: N/A  New problem(s) identified: None identified at this time.   New Short Term/Long Term Goal(s): "I want help with love, peace and true happiness"  Discharge Plan or Barriers:   Reason for Continuation of Hospitalization: Disorganization Paranoia  Medication stabilization   Estimated Length of Stay: 1/18  Attendees: Patient:  09/01/2017  10:40  AM  Physician: Maris Berger, MD 09/01/2017  10:40 AM  Nursing: Grayland Ormond, RN 09/01/2017  10:40 AM  RN Care Manager: Lars Pinks, RN 09/01/2017  10:40 AM  Social Worker: Ripley Fraise 09/01/2017  10:40 AM  Recreational Therapist: Winfield Cunas 09/01/2017  10:40 AM  Other: Norberto Sorenson 09/01/2017  10:40 AM  Other:  09/01/2017  10:40 AM    Scribe for Treatment Team:  Roque Lias LCSW 09/01/2017 10:40 AM

## 2017-09-01 NOTE — Plan of Care (Signed)
Nurse discussed depression, anxiety, coping skills with patient.  

## 2017-09-01 NOTE — [facility] Group Notes (Signed)
LCSW Group Therapy Note   09/01/2017 1:15pm   Type of Therapy and Topic:  Group Therapy:  Positive Affirmations   Participation Level:  Active  Description of Group: This group addressed positive affirmation toward self and others. Patients went around the room and identified two positive things about themselves and two positive things about a peer in the room. Patients reflected on how it felt to share something positive with others, to identify positive things about themselves, and to hear positive things from others. Patients were encouraged to have a daily reflection of positive characteristics or circumstances.  Therapeutic Goals 1. Patient will verbalize two of their positive qualities 2. Patient will demonstrate empathy for others by stating two positive qualities about a peer in the group 3. Patient will verbalize their feelings when voicing positive self affirmations and when voicing positive affirmations of others 4. Patients will discuss the potential positive impact on their wellness/recovery of focusing on positive traits of self and others. Summary of Patient Progress:  Stayed the entire time, engaged throughout.  Stated she is getting through this difficult time "by focusing on going to the gym or outside when we can, and by getting my vitals taken."  Limited insight.  Therapeutic Modalities Cognitive Behavioral Therapy Motivational Interviewing  Ida RogueRodney B Benny Henrie, [place] 09/01/2017 3:29 PM

## 2017-09-01 NOTE — Plan of Care (Signed)
  Safety: Ability to remain free from injury will improve 09/01/2017 0040 - Progressing by Delos HaringPhillips, Caiden Monsivais A, RN Note Pt safe on unit at this time
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
## 2017-09-01 NOTE — Progress Notes (Signed)
D:  Patient refused to fill out self inventory form.  A:  Medications administered to patient this morning.  Emotional support and encouragement given patient. R:  Denied SI and HI, contracts for safety.  Denied A/V hallucinations.  Safety maintained with 15 minute checks. :

## 2017-09-01 NOTE — Progress Notes (Signed)
Recreation Therapy Notes  Date: 09/01/17 Time: 1000 Location: 500 Hall Dayroom  Group Topic: Coping Skills  Goal Area(s) Addresses:  Patients will be able to identify positive coping skills. Patients will be able to identify the benefits of using coping skills post d/c.  Behavioral Response: Engaged  Intervention: Worksheet, dry erase board, dry erase marker  Activity: Mind map.  Patients were given a blank mind map.  LRT and patients filled out the first 8 boxes together with anxiety, depression, paranoia, fear, concentration, finances, hygiene and disappointment.  Individually, patients were to then come up with 3 coping skills for each area identified.  The group would then reconvene and LRT would put the coping skills on the board.  Education: PharmacologistCoping Skills, Building control surveyorDischarge Planning.   Education Outcome: Acknowledges understanding/In group clarification offered/Needs additional education.   Clinical Observations/Feedback: Pt arrived a little late but caught to the rest of the group.  Pt identified some of her coping skills as meditation, going out with friends, get enough rest the night before, wash hands and exercise.    Lorraine RancherMarjette Xavier Bryant, LRT/CTRS         Lorraine AbedLindsay, [name] 09/01/2017 1:19 PM

## 2017-09-02 LAB — PROLACTIN: Prolactin: 84.3 ng/mL — ABNORMAL HIGH (ref 4.8–23.3)

## 2017-09-02 MED ORDER — FLUPHENAZINE DECANOATE 25 MG/ML IJ SOLN
25.0000 mg | INTRAMUSCULAR | Status: DC
  Administered 2017-09-02: 25 mg via INTRAMUSCULAR
  Filled 2017-09-02: qty 1

## 2017-09-02 MED ORDER — FLUPHENAZINE HCL 5 MG PO TABS
5.0000 mg | ORAL_TABLET | Freq: Two times a day (BID) | ORAL | Status: DC
  Administered 2017-09-03: 5 mg via ORAL
  Filled 2017-09-02 (×6): qty 1

## 2017-09-02 NOTE — Progress Notes (Signed)
D: Patient observed up and visible on the hall. Tolerating meals off unit without any incidents. Patient states she is doing well and might go home tomorrow. "I'm so excited." Patient's affect bright with fixed smile, mood elevated. Denies depression, anxiety and hopelessness. No AVH. Remains somewhat suspicious, cautious. Denies pain, physical complaints. Reluctant to EKG however did agree.   A: Medicated per orders, long acting prolixin injection given to L deltoid. Level III obs in place for safety. Emotional support offered and self inventory encouraged. Also encouraged completion of Suicide Safety Plan and programming participation. Discussed POC with MD. EKG on chart and indicates NSR.   R: Patient verbalizes understanding of POC. Patient denies SI/HI/AVH and remains safe on level III obs. Will continue to monitor closely and make verbal contact frequently.

## 2017-09-02 NOTE — Progress Notes (Signed)
Recreation Therapy Notes  Date: 09/02/17 Time: 1000 Location: 500 Hall Dayroom  Group Topic: Leisure Education, Goal Setting  Goal Area(s) Addresses:  Patient will be able to identify at least 3 life goals.  Patient will be able to identify benefit of investing in life goals.  Patient will be able to identify benefit of setting life goals.   Behavioral Response:  Engaged  Intervention: Worksheet  Activity: Garment/textile technologistGoal Planning.  Patients were given a worksheet in which they had to identify goals they wanted to accomplish within the next week, month, year and five years.  Patients were to then identify the obstacles to reaching their goals, what they will need to do to achieve their goals and what they can start doing immediately to work towards their goals.  Education:  Discharge Planning, PharmacologistCoping Skills, Leisure Education   Education Outcome: Acknowledges Education/In Group Clarification Provided/Needs Additional Education  Clinical Observation:  Pt stated goals "help you accomplish things".  Pt also stated goals can be big or small depending on the person".  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  Pt identified her obstacles as hard work/assignments/talk to Fleet ContrasRachel; things needed to achieve goals are being diligent and networking; and what she can do tomorrow to work towards her goals are begin completing assignments.     Caroll RancherMarjette Bennet Kujawa, LRT/CTRS     Lillia AbedLindsay, Floreine Kingdon A 09/02/2017 11:03 AM

## 2017-09-02 NOTE — Plan of Care (Signed)
Patient has been pleasant and cooperative today. Allowing staff to deliver treatment, care as ordered.  Patient has not engaged in self harm and denies thoughts to do so.
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
## 2017-09-02 NOTE — [facility] Group Notes (Signed)
LCSW Group Therapy Note  09/02/2017 1:15pm  Type of Therapy/Topic:  Group Therapy:  Balance in Life  Participation Level:  Active  Description of Group:    This group will address the concept of balance and how it feels and looks when one is unbalanced. Patients will be encouraged to process areas in their lives that are out of balance and identify reasons for remaining unbalanced. Facilitators will guide patients in utilizing problem-solving interventions to address and correct the stressor making their life unbalanced. Understanding and applying boundaries will be explored and addressed for obtaining and maintaining a balanced life. Patients will be encouraged to explore ways to assertively make their unbalanced needs known to significant others in their lives, using other group members and facilitator for support and feedback.  Therapeutic Goals: 1. Patient will identify two or more emotions or situations they have that consume much of in their lives. 2. Patient will identify signs/triggers that life has become out of balance:  3. Patient will identify two ways to set boundaries in order to achieve balance in their lives:  4. Patient will demonstrate ability to communicate their needs through discussion and/or role plays  Summary of Patient Progress:  "I am phenomenally balanced."  Could not understand that it may be possible to be too up or euphoric, but another patient could.  J did call for moderation when she heard the other patient.  Identified dance as a way of finding balance when she needs it.    Therapeutic Modalities:   Cognitive Behavioral Therapy Solution-Focused Therapy Assertiveness Training  Ida RogueRodney B Ottie Neglia, [place] 09/02/2017 1:16 PM

## 2017-09-02 NOTE — Progress Notes (Signed)
Patient ID: Lorraine Bryant, female   DOB: 08/21/1985, 32 y.o.   MRN: [id number]   DAR Note: Pt continue to exhibit bizarre behaviors-Pt will be have a conversation with staffs then will suddenly stop talking. Pt pretended to be sleeping just because she didn't want to take 2200 medications but Pt's eyes continue to Twitching. Pt continue to be intrusive. Pt denied any anxiety, depression, pain, or AVH. All patient's questions and concerns addressed. Support, encouragement, and safe environment provided. 15-minute safety checks continue. Pt was med compliant. Safety checks continue.

## 2017-09-03 MED ORDER — TRAZODONE HCL 100 MG PO TABS: 100.0000 mg | ORAL_TABLET | Freq: Every evening | ORAL | 0 refills | Status: DC | PRN

## 2017-09-03 MED ORDER — BENZTROPINE MESYLATE 0.5 MG PO TABS: 0.5000 mg | ORAL_TABLET | Freq: Two times a day (BID) | ORAL | 0 refills | Status: DC | PRN

## 2017-09-03 MED ORDER — HYDROXYZINE HCL 25 MG PO TABS: 25.0000 mg | ORAL_TABLET | Freq: Four times a day (QID) | ORAL | 0 refills | Status: DC | PRN

## 2017-09-03 MED ORDER — FLUPHENAZINE DECANOATE 25 MG/ML IJ SOLN: 25.0000 mg | INTRAMUSCULAR | 0 refills | Status: DC

## 2017-09-03 MED ORDER — FLUPHENAZINE HCL 5 MG PO TABS: 5.0000 mg | ORAL_TABLET | Freq: Two times a day (BID) | ORAL | 0 refills | Status: DC
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
## 2017-09-03 NOTE — Progress Notes (Signed)
Patient ID: Lorraine Bryant, female   DOB: 05/14/1986, 32 y.o.   MRN: [id number]  DAR Note: Pt continue to refuse 2200 medication, "I got an injection today." Pt denied any anxiety, depression, pain, or AVH. All patient's questions and concerns addressed. Support, encouragement, and safe environment provided. 15-minute safety checks continue. Pt was med compliant. Safety checks continue.

## 2017-09-03 NOTE — Progress Notes (Signed)
Discharge Note: Patient educated about follow up care, upcoming appointments reviewed. Patient verbalizes understanding of all follow up appointments. AVS, transition record, and and SRA reviewed. Patient expresses no concerns or questions at this time. Educated on prescriptions and medication regimen. Patient belongings returned. Patient denies SI, HI, AVH at this time. Educated patient about suicide help resources and hotline, encouraged to call for assistance in the event of a crisis. Patient agrees. Patient is ambulatory and safe at time of discharge. Patient discharged to hospital lobby at this time.

## 2017-09-03 NOTE — Plan of Care (Signed)
Pt attended and participated in self esteem, goal setting, coping skills, wellness, team building, anger management and self expression recreation therapy sessions.  Lorraine RancherMarjette Valorie Bryant, LRT/CTRS

## 2017-09-03 NOTE — Progress Notes (Signed)
Recreation Therapy Notes  INPATIENT RECREATION TR PLAN  Patient Details Name: Lorraine Bryant MRN: [id number] DOB: [birth year] Today's Date: 09/03/2017  Rec Therapy Plan Is patient appropriate for Therapeutic Recreation?: Yes Treatment times per week: about 3 days Estimated Length of Stay: 5-7 days TR Treatment/Interventions: Group participation (Comment)  Discharge Criteria Pt will be discharged from therapy if:: Discharged Treatment plan/goals/alternatives discussed and agreed upon by:: Patient/family  Discharge Summary Short term goals set: Pt will atted and participate in recreation therapy sessions. Short term goals met: Complete Progress toward goals comments: Groups attended Which groups?: Self-esteem, Wellness, Coping skills, Goal setting, Anger management, Other (Comment)(Self expression, Team building) Reason goals not met: None Therapeutic equipment acquired: N/A Reason patient discharged from therapy: Discharge from hospital Pt/family agrees with progress & goals achieved: Yes Date patient discharged from therapy: 09/03/17    Victorino Sparrow, LRT/CTRS  Ria Comment, Nadege Carriger A 09/03/2017, 12:53 PM
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
## 2017-09-03 NOTE — Progress Notes (Signed)
Recreation Therapy Notes  Date: 09/03/17 Time: 1000 Location: 500 Hall Dayroom  Group Topic: Self-Esteem  Goal Area(s) Addresses:  Patient will successfully identify positive attributes about themselves.  Patient will successfully identify benefit of improved self-esteem.   Behavioral Response: Engaged  Intervention: Blank crest, markers  Activity: Crest of Arms.  Patients were given a blank crest divided into four boxes.  Patients were to highlight people, events, important dates or accomplishments that helped to shape the person they have become.  Education:  Self-Esteem, Building control surveyorDischarge Planning.   Education Outcome: Acknowledges education/In group clarification offered/Needs additional education  Clinical Observations/Feedback: Pt stated self esteem is how you feel about yourself.  Pt also stated "people tend to focus on the negative because it outweighs the positive".  Pt stated she likes her eyes, smile, family, husband, praying and graduating from grad school.  Pt also expressed [place] in [place] and [place] and being able to do double [place].  Pt expressed wanting to go to [place], [place], [place], [place].  Lastly, she stated she likes shopping and getting manicures and pedicures.    Caroll RancherMarjette Rand Etchison, LRT/CTRS      Lillia AbedLindsay, Todd Jelinski A 09/03/2017 11:03 AM

## 2017-09-03 NOTE — Progress Notes (Signed)
  [facility] Discharge Plan :  Will you be returning to the same living situation after discharge:  Yes,  home At discharge, do you have transportation home?: Yes,  mother Do you have the ability to pay for your medications: Yes,  MCD  Release of information consent forms completed and in the chart;  Patient's signature needed at discharge.  Patient to Follow up at: Follow-up Information    [facility] on 09/09/2017.   Why:  Tuesday at 10:30 with Dr Hortencia PilarA Contact information: [address] [place] [address] [phone]           Next level of care provider has access to [facility] Link:no  Safety Planning and Suicide Prevention discussed: Yes,  yes  Have you used any form of tobacco in the last 30 days? (Cigarettes, Smokeless Tobacco, Cigars, and/or Pipes): No  Has patient been referred to the Quitline?: N/A patient is not a smoker  Patient has been referred for addiction treatment: N/A  Ida RogueRodney B Sherrin Stahle, LCSW 09/03/2017, 9:14 AM

## 2017-10-02 ENCOUNTER — Ambulatory Visit (INDEPENDENT_AMBULATORY_CARE_PROVIDER_SITE_OTHER): Payer: Medicaid Other | Admitting: Licensed Clinical Social Worker

## 2017-10-02 DIAGNOSIS — F200 Paranoid schizophrenia: Secondary | ICD-10-CM

## 2017-10-06 ENCOUNTER — Encounter (HOSPITAL_COMMUNITY): Payer: Self-pay | Admitting: Licensed Clinical Social Worker

## 2017-10-06 NOTE — Progress Notes (Signed)
Comprehensive Clinical Assessment (CCA) Note  10/06/2017 [name] [id number]  Visit Diagnosis:      ICD-10-CM   1. Schizophrenia, paranoid (HCC) F20.0       CCA Part One  Part One has been completed on paper by the patient.  (See scanned document in Chart Review)  CCA Part Two A  Intake/Chief Complaint:  CCA Intake With Chief Complaint CCA Part Two Date: 10/02/17 CCA Part Two Time: 1114 Chief Complaint/Presenting Problem: "I need help, advice handling my spirituality; I'm Jesus; I battle w/ the devil occasionally; I'm carrying the baby of 'Lorraine Bryant' even though we haven't had intercourse" Patients Currently Reported Symptoms/Problems: hallucinations, delusions, a little bit of anxiety  Collateral Involvement: Lorraine CustardAaron, HS Sweetheart, Type of Services Patient Feels Are Needed: I need advice on how to handle the baby of Lorraine Bryant and my mission to do miracles. Initial Clinical Notes/Concerns: when asked, 80% believes she is carrying baby though she has negative pregnancy test and has not had intercourse recently  Mental Health Symptoms Depression:     Mania:     Anxiety:   Anxiety: Worrying  Psychosis:  Psychosis: Hallucinations, Delusions, Other negative symptoms(inappropriate laughing)  Trauma:     Obsessions:  Obsessions: Cause anxiety, Intrusive/time consuming, Recurrent & persistent thoughts/impulses/images  Compulsions:     Inattention:     Hyperactivity/Impulsivity:     Oppositional/Defiant Behaviors:     Borderline Personality:     Other Mood/Personality Symptoms:      Mental Status Exam Appearance and self-care  Stature:  Stature: Average  Weight:  Weight: Overweight  Clothing:  Clothing: Neat/clean, Meticulous  Grooming:  Grooming: Bizarre  Cosmetic use:  Cosmetic Use: Excessive  Posture/gait:  Posture/Gait: Stooped  Motor activity:  Motor Activity: Not Remarkable  Sensorium  Attention:  Attention: Normal  Concentration:  Concentration: Scattered   Orientation:  Orientation: Place, Time  Recall/memory:  Recall/Memory: Normal  Affect and Mood  Affect:  Affect: Not Congruent  Mood:  Mood: Euthymic  Relating  Eye contact:  Eye Contact: Staring  Facial expression:  Facial Expression: Tense  Attitude toward examiner:  Attitude Toward Examiner: Cooperative, Silly, Dramatic  Thought and Language  Speech flow: Speech Flow: Normal  Thought content:  Thought Content: Delusions, Ideas of influence  Preoccupation:  Preoccupations: Religion  Hallucinations:     Organization:     Company secretaryxecutive Functions  Fund of Knowledge:  Fund of Knowledge: Average  Intelligence:  Intelligence: Average  Abstraction:  Abstraction: Government social research officerverly abstract  Judgement:  Judgement: Dangerous, Poor  Reality Testing:  Reality Testing: Adequate, Variable  Insight:  Insight: Poor  Decision Making:  Decision Making: Vacilates, Confused  Social Functioning  Social Maturity:  Social Maturity: Impulsive, Self-centered, Isolates  Social Judgement:  Social Judgement: Victimized  Stress  Stressors:  Stressors: Family conflict  Coping Ability:  Coping Ability: Building surveyorverwhelmed  Skill Deficits:     Supports:      Family and Psychosocial History: Family history Marital status: Single Are you sexually active?: No What is your sexual orientation?: hetero Does patient have children?: No  Childhood History:  Childhood History By whom was/is the patient raised?: Both parents Additional childhood history information: "I've always had a strong level of faith and what I say happens; I just knew things" Description of patient's relationship with caregiver when they were a child: "My dad would come around on the weekends" Patient's description of current relationship with people who raised him/her: Father lives in [place], I talk to him sporadically;  Does patient have  siblings?: Yes Number of Siblings: 2 Description of patient's current relationship with siblings: "My brother Lorraine Bryant passed  when I was 32yo; But I feel like he's actually my son"  CCA Part Two B  Employment/Work Situation:    Education:    Religion:  Christian: "I'm Jesus"  Leisure/Recreation:  School, reading  Exercise/Diet:  None  CCA Part Two C  Alcohol/Drug Use:  None                      CCA Part Three  ASAM's:  Six Dimensions of Multidimensional Assessment  Dimension 1:  Acute Intoxication and/or Withdrawal Potential:     Dimension 2:  Biomedical Conditions and Complications:     Dimension 3:  Emotional, Behavioral, or Cognitive Conditions and Complications:     Dimension 4:  Readiness to Change:     Dimension 5:  Relapse, Continued use, or Continued Problem Potential:     Dimension 6:  Recovery/Living Environment:      Substance use Disorder (SUD)    Social Function:  Social Functioning Social Maturity: Impulsive, Self-centered, Isolates Social Judgement: Victimized  Stress:  Stress Stressors: Family conflict Coping Ability: Overwhelmed Patient Takes Medications The Way The Doctor Instructed?: Yes Priority Risk: Moderate Risk  Risk Assessment- Self-Harm Potential: Risk Assessment For Self-Harm Potential Thoughts of Self-Harm: No current thoughts Method: No plan Availability of Means: No access/NA Additional Information for Self-Harm Potential: Acts of Self-harm  Risk Assessment -Dangerous to Others Potential: Risk Assessment For Dangerous to Others Potential Method: No Plan Availability of Means: No access or NA Notification Required: No need or identified person Additional Information for Danger to Others Potential: Active psychosis  DSM5 Diagnoses: Patient Active Problem List   Diagnosis Date Noted  . Schizophrenia, paranoid (HCC) 08/21/2017  . Paranoid schizophrenia (HCC) 07/15/2017    Patient Centered Plan: Patient is on the following Treatment Plan(s): Schizophrenia   Recommendations for Services/Supports/Treatments: Recommendations for  Services/Supports/Treatments Recommendations For Services/Supports/Treatments: Individual Therapy  Treatment Plan Summary:    Referrals to Alternative Service(s): Referred to Alternative Service(s):   Place:   Date:   Time:    Referred to Alternative Service(s):   Place:   Date:   Time:    Referred to Alternative Service(s):   Place:   Date:   Time:    Referred to Alternative Service(s):   Place:   Date:   Time:     [name]

## 2017-10-11 ENCOUNTER — Ambulatory Visit (INDEPENDENT_AMBULATORY_CARE_PROVIDER_SITE_OTHER): Payer: Medicaid Other

## 2017-10-11 ENCOUNTER — Ambulatory Visit (INDEPENDENT_AMBULATORY_CARE_PROVIDER_SITE_OTHER): Payer: Medicaid Other | Admitting: Psychiatry

## 2017-10-11 ENCOUNTER — Encounter (HOSPITAL_COMMUNITY): Payer: Self-pay | Admitting: Psychiatry

## 2017-10-11 ENCOUNTER — Encounter (HOSPITAL_COMMUNITY): Payer: Self-pay

## 2017-10-11 VITALS — BP 102/72 | HR 84 | Ht 66.0 in | Wt 230.0 lb

## 2017-10-11 DIAGNOSIS — F200 Paranoid schizophrenia: Secondary | ICD-10-CM

## 2017-10-11 DIAGNOSIS — Z818 Family history of other mental and behavioral disorders: Secondary | ICD-10-CM | POA: Diagnosis not present

## 2017-10-11 DIAGNOSIS — Z813 Family history of other psychoactive substance abuse and dependence: Secondary | ICD-10-CM | POA: Diagnosis not present

## 2017-10-11 DIAGNOSIS — Z79899 Other long term (current) drug therapy: Secondary | ICD-10-CM | POA: Diagnosis not present

## 2017-10-11 MED ORDER — FLUPHENAZINE DECANOATE 25 MG/ML IJ SOLN: 25.0000 mg | INTRAMUSCULAR | 10 refills | Status: DC

## 2017-10-11 MED ORDER — FLUPHENAZINE DECANOATE 25 MG/ML IJ SOLN
25.0000 mg | INTRAMUSCULAR | Status: DC
  Administered 2017-10-11 – 2017-12-01 (×4): 25 mg via INTRAMUSCULAR

## 2017-10-11 MED ORDER — FLUPHENAZINE HCL 5 MG PO TABS: 5.0000 mg | ORAL_TABLET | Freq: Two times a day (BID) | ORAL | 2 refills | Status: DC

## 2017-10-11 NOTE — Progress Notes (Signed)
Psychiatric Initial Adult Assessment   Patient Identification: Lorraine Bryant MRN:  [id number] Date of Evaluation:  10/11/2017 Referral Source: [facility] Chief Complaint:   Visit Diagnosis:    ICD-10-CM   1. Schizophrenia, paranoid (HCC) F20.0     History of Present Illness: This patient is a 32 year old black female who lives with her mother in [place].  She is currently on disability for mental illness.  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  The patient was referred by the behavioral health Hospital where she was recently hospitalized for exacerbation of her schizophrenia.  The patient and mother present today for follow-up.  The mother reports that the patient developed normally but around age 19 when attending [employer] she became severely anxious claustrophobic delusional and had thought disorganization.  She went to a hospital in [place] and was put on Abilify.  She stated that she did not like the Abilify and she "felt like a zombie.  Eventually she did better on a combination of olanzapine and Tegretol.  In February 2013 she was off all medications and became catatonic paranoid and delusional.  She was hospitalized at [facility] in [place].  She stayed there for almost a month.  Since then she has been following up with Dr. Jannifer Franklin here in [place] and she is been on a variety of medications.  She did the best on the olanzapine and was switched to in [place] injection when it was found that she was not very compliant with pills.  Around this time however her prolactin was checked and it was around 150.  At that point she was switched to [facility] which made her very nauseous and eventually back to Abilify injections plus pills it was well on the Abilify that she ended up being rehospitalized both in November of last year and then last month.  Both times she was erratic agitated angry psychotic and delusional.  She was last put on  Prolixin decanoate 25 mg and received her last dosage about 2 weeks ago at Dr. Roberts Gaudy office.  She is also taking Prolixin 5 mg twice a day orally.  She states that she feels blunted and has no emotion on this medicine.  She still having some of the galactorrhea.  Her current prolactin level at the hospital was 84.  She is scheduled to see an OB/GYN soon to evaluate this plus the recurring galactorrhea and spotting.  She is currently not sexually active or dating anyone.  She is no longer having hallucinations delusions thought disorganization and she is sleeping and eating well and her energy is fair.  She does not like taking pills and I told her if she could get here every 2 weeks to get her injection we could do without the pills for now and see how she does.  Her mother keeps a very close eye on her and if she starts to become even a little bit delusional she will get her back on the pills right away  Associated Signs/Symptoms: Depression Symptoms:  psychomotor retardation, (Hypo) Manic Symptoms: Currently denies but has experienced these recently when psychotic Anxiety Symptoms: denies Psychotic Symptoms: Denies today PTSD Sympto Past Psychiatric History: Several past hospitalizations for psychosis more most recently last month in the [facility]  Previous Psychotropic Medications: Yes   Substance Abuse History in the last 12 months:  No.  Consequences of Substance Abuse: Negative  Past Medical History:  Past Medical History:  Diagnosis Date  . Depression   .  Hallucinations   . Psychosis (HCC)    per pt and family last year    Past Surgical History:  Procedure Laterality Date  . NO PAST SURGERIES      Family Psychiatric History: Maternal great aunt had schizophrenia.  The father has a history of substance abuse  Family History:  Family History  Problem Relation Age of Onset  . Drug abuse Father   . Schizophrenia Maternal Aunt     Social History:   Social History    Socioeconomic History  . Marital status: Single    Spouse name: None  . Number of children: None  . Years of education: None  . Highest education level: None  Social Needs  . Financial resource strain: None  . Food insecurity - worry: None  . Food insecurity - inability: None  . Transportation needs - medical: None  . Transportation needs - non-medical: None  Occupational History  . None  Tobacco Use  . Smoking status: Never Smoker  . Smokeless tobacco: Never Used  Substance and Sexual Activity  . Alcohol use: No  . Drug use: No    Comment: unable to assess d/t pt not responding to questioning  . Sexual activity: No  Other Topics Concern  . None  Social History Narrative  . None    Additional Social History:   Allergies:   Allergies  Allergen Reactions  . Abilify [Aripiprazole] Other (See Comments)    Slow moving and hand cramping up    Metabolic Disorder Labs: No results found for: HGBA1C, MPG Lab Results  Component Value Date   PROLACTIN 84.3 (H) 09/01/2017   No results found for: CHOL, TRIG, HDL, CHOLHDL, VLDL, LDLCALC   Current Medications: Current Outpatient Medications  Medication Sig Dispense Refill  . benztropine (COGENTIN) 0.5 MG tablet Take 1 tablet (0.5 mg total) by mouth 2 (two) times daily as needed for tremors (EPS). 60 tablet 0  . fluPHENAZine (PROLIXIN) 5 MG tablet Take 1 tablet (5 mg total) by mouth 2 (two) times daily. For mood control 60 tablet 0  . fluPHENAZine decanoate (PROLIXIN) 25 MG/ML injection Inject 1 mL (25 mg total) into the muscle every 21 ( twenty-one) days. For mood control 5 mL 0  . hydrOXYzine (ATARAX/VISTARIL) 25 MG tablet Take 1 tablet (25 mg total) by mouth every 6 (six) hours as needed for anxiety. (Patient not taking: Reported on 10/11/2017) 60 tablet 0  . traZODone (DESYREL) 100 MG tablet Take 1 tablet (100 mg total) by mouth at bedtime as needed for sleep. (Patient not taking: Reported on 10/11/2017) 30 tablet 0   No  current facility-administered medications for this visit.     Neurologic: Headache: No Seizure: No Paresthesias:No  Musculoskeletal: Strength & Muscle Tone: within normal limits Gait & Station: normal Patient leans: N/A  Psychiatric Specialty Exam: Review of Systems  Constitutional: Positive for malaise/fatigue.  All other systems reviewed and are negative.   Blood pressure 102/72, pulse 84, height 5\' 6"  (1.676 m), weight 230 lb (104.3 kg), SpO2 97 %.Body mass index is 37.12 kg/m.  General Appearance: Casual and Fairly Groomed  Eye Contact:  Good  Speech:  Clear and Coherent  Volume:  Normal  Mood:  Euthymic  Affect:  Congruent  Thought Process:  Goal Directed  Orientation:  Full (Time, Place, and Person)  Thought Content:  Rumination  Suicidal Thoughts:  No  Homicidal Thoughts:  No  Memory:  Immediate;   Good Recent;   Good Remote;   Fair  Judgement:  Poor  Insight:  Lacking  Psychomotor Activity:  Normal  Concentration:  Concentration: Good and Attention Span: Good  Recall:  Good  Fund of Knowledge:Good  Language: Good  Akathisia:  No  Handed:  Right  AIMS (if indicated): Denies any symptoms of dyskinesia, twitching jerking  Assets:  Communication Skills Desire for Improvement Physical Health Resilience Social Support Talents/Skills  ADL's:  Intact  Cognition: WNL  Sleep:  good    Treatment Plan Summary: Medication management   This patient is a 32 year old female with significant symptoms of schizophrenia for the past 10 years.  She has had difficulty being compliant with pills.  She is very concerned about galactorrhea and elevated prolactin but I explained to her that these can be resultant from any of the antipsychotics.  Her main concern needs to be prevention of recurrence of her psychosis.  She seems to be doing very well with the Prolixin although she feels a bit blunted.  We made a compromise and that she would stop the oral Prolixin but continue  to come here every 2 weeks for the injectable 25 mg.  If her mother sees any symptoms of psychosis recurring she will restart the oral medication immediately.  Abilify obviously does not work for her Kasandra Knudsen did not help.  In [place] and olanzapine injections could be considered in the future if she has more difficulty with the Prolixin.  She will return to see a physician here in 4 weeks and continue her injections every 2 weeks     Diannia Ruder, MD 2/23/20191:56 PM

## 2017-10-11 NOTE — Patient Instructions (Signed)
Return every 2 weeks for prolixin injection

## 2017-10-11 NOTE — Progress Notes (Signed)
Met with patient after her initial evaluation with Dr. Ross today as patient is due for a Prolixin Decanoate 25mg/ml IM injection.  Order was provided by Dr. Ross requesting this be done today and to continue every 2 weeks.  Patient's Mother went home and got the medication file of Prolixin Decanoate she had at home.  Medication prepared and administered as ordered this date by Dr. Ross into patient's Right Deltoid area.  Patient tolerated due injection without complaint of pain or discomfort and will return in 2 weeks for next due injection.  Patient denied any active symptoms at this time except for continued elevated prolactin levels and will stated plans with her Mother to follow up with Dr. Kerr and endocrinologist for this in the coming week.  Patient will now begin seeing Dr. Arfeen for follow-ups and will call if any problems.  Patient to return in 2 weeks for next due injection.  

## 2017-10-21 ENCOUNTER — Ambulatory Visit (INDEPENDENT_AMBULATORY_CARE_PROVIDER_SITE_OTHER): Payer: Medicaid Other | Admitting: Licensed Clinical Social Worker

## 2017-10-21 DIAGNOSIS — F200 Paranoid schizophrenia: Secondary | ICD-10-CM | POA: Diagnosis not present

## 2017-10-22 ENCOUNTER — Telehealth: Payer: Self-pay | Admitting: *Deleted

## 2017-10-22 NOTE — Telephone Encounter (Signed)
Patient called front desk stating she is not sure when to get her next gardisil shot. Per chart review had 1st 11/14/1 and should have gotten 2nd around 09/01/17. Discussed with Dr. Debroah LoopArnold and informed registrar to schedue for asap because she is late.

## 2017-10-24 ENCOUNTER — Encounter (HOSPITAL_COMMUNITY): Payer: Self-pay | Admitting: Licensed Clinical Social Worker

## 2017-10-24 NOTE — Progress Notes (Signed)
   THERAPIST PROGRESS NOTE  Session Time: 3-4  Participation Level: Active  Behavioral Response: Neat and Well GroomedAlertEuthymic  Type of Therapy: Individual Therapy  Treatment Goals addressed: Diagnosis: Schizophrenia  Interventions: CBT, Supportive, Social Skills Training and Reframing  Summary: Lorraine Bryant is a 32 y.o. female who presents with Schizophrenia, paranoid type. She lacks insight into her dx and reports she believes her delusions 80% of the time, most days. She is active and engaged in session. Her affect is staring, smiling, and flat. She states she is doing much better and has enrolled in real estate classes. She states her medications appear to be "helping her racing thoughts". She reports she wants to sell house and become a behavior analyst.   Pt discusses her memories of "being able to heal others through touch and thoughts" while she was in tx facilities in the past. Counselor encourages pt come up w/ tx goals. Pt states she wants to lose weight and cut out red meat. She wants to finish school by Fall 2020.  Suicidal/Homicidal: Nowithout intent/plan  Therapist Response: Counselor used open questions, supportive feedback, and encouragement. Counselor helped pt consider various perspectives on her short term goals. Pt and counselor agreed on tx plan and e-signed doc..  Plan: Return again in 2 weeks.  Diagnosis:    ICD-10-CM   1. Paranoid schizophrenia (HCC) F20.0        Margo CommonWesley E Swan, LCAS-A 10/24/2017

## 2017-10-27 ENCOUNTER — Other Ambulatory Visit: Payer: Self-pay | Admitting: Internal Medicine

## 2017-10-27 ENCOUNTER — Ambulatory Visit (INDEPENDENT_AMBULATORY_CARE_PROVIDER_SITE_OTHER): Payer: Medicaid Other

## 2017-10-27 DIAGNOSIS — E221 Hyperprolactinemia: Secondary | ICD-10-CM

## 2017-10-27 DIAGNOSIS — N643 Galactorrhea not associated with childbirth: Secondary | ICD-10-CM

## 2017-10-27 DIAGNOSIS — F200 Paranoid schizophrenia: Secondary | ICD-10-CM

## 2017-10-27 NOTE — Progress Notes (Signed)
Patient presented with appropriate affect for her bi-weekly injection of Prolixin 25 mg/mL. Patient reports no hallucinations and no suicidal thoughts. She reports no side effects and a good appetite. Injection was prepared as ordered and 1 ml was administered IM in the left deltoid. Patient tolerated the procedure without complaint and will be back in 2 weeks for her next injection.

## 2017-10-30 ENCOUNTER — Ambulatory Visit (INDEPENDENT_AMBULATORY_CARE_PROVIDER_SITE_OTHER): Payer: Medicaid Other | Admitting: General Practice

## 2017-10-30 VITALS — BP 114/86 | HR 70 | Ht 63.0 in | Wt 226.0 lb

## 2017-10-30 DIAGNOSIS — Z23 Encounter for immunization: Secondary | ICD-10-CM | POA: Diagnosis present

## 2017-10-30 NOTE — Progress Notes (Signed)
Van ClinesJaleesia Bolls here for Gardasil #2  Injection.  Injection administered without complication. Patient will return after May 1st for #3 injection.  Marylynn PearsonCarrie Rogina Schiano, RN 10/30/2017  3:47 PM

## 2017-10-31 NOTE — Progress Notes (Signed)
Chart reviewed for nurse visit. Agree with plan of care.   Marylene LandKooistra, Lawton Dollinger Lorraine, CNM 10/31/2017 2:32 PM

## 2017-11-04 ENCOUNTER — Ambulatory Visit (INDEPENDENT_AMBULATORY_CARE_PROVIDER_SITE_OTHER): Payer: Medicaid Other | Admitting: Licensed Clinical Social Worker

## 2017-11-04 DIAGNOSIS — F200 Paranoid schizophrenia: Secondary | ICD-10-CM

## 2017-11-04 NOTE — Progress Notes (Signed)
   THERAPIST PROGRESS NOTE  Session Time: 11-12  Participation Level: Active  Behavioral Response: Neat and Well GroomedAlertEuthymic  Type of Therapy: Individual Therapy  Treatment Goals addressed: Diagnosis: Schizophrenia  Interventions: CBT, Supportive and Reframing  Summary: Lorraine ClinesJaleesia Radler is a 32 y.o. female who presents with Schizophrenia, paranoid type. No changes in dx. She continues to report decreasing levels of spiritual hallucinations.  I want to talk about some romantic relationships I'm pursuing". Pt discusses a man from church she is attracted to and was "set up with" last Fall.   "My mother is so protective bc my brother passed away at a young age, tragically in a dirtbike accident. I had a lot of questions and nightmares about what happened to him when he died."   "My [name] shot is helping me live in the day-to-day; I felt like I was losing connection to reality when I was hallucinating".  "Still in real estate classes. Grad school starts back April 9th; [employer]".  "[place] living in [place] and I want to move back; I've told my mother and I have a plan to move in w/ my best friend this summer".  Pt reports [place], [place] her medication stable before she moves permanently. Counselor and pt discuss importance of budgeting, planing flights/travel, and securing employment/job interviews before she moves. Counselor asks pt if next session can involve pt's mother. Pt agrees and states she "wants her mother to know how serious she is about wanting to get healthy".  Suicidal/Homicidal: Nowithout intent/plan  Therapist Response: Counselor used closed questions to assess pt's level of dysfunction, experience of hallucinations and delusions. Counselor explored early memories of siblings tragic death and pt's witness of the accident. Counselor inquired about family counseling in the following session.  Plan: Return again in 2  weeks.  Diagnosis:    ICD-10-CM   1. Paranoid schizophrenia (HCC) F20.0        Margo CommonWesley E Shadoe Cryan, LCAS-A 11/04/2017

## 2017-11-05 ENCOUNTER — Encounter (HOSPITAL_COMMUNITY): Payer: Self-pay | Admitting: Licensed Clinical Social Worker

## 2017-11-10 ENCOUNTER — Encounter (HOSPITAL_COMMUNITY): Payer: Self-pay | Admitting: Psychiatry

## 2017-11-10 ENCOUNTER — Ambulatory Visit (INDEPENDENT_AMBULATORY_CARE_PROVIDER_SITE_OTHER): Payer: Medicaid Other

## 2017-11-10 ENCOUNTER — Ambulatory Visit (INDEPENDENT_AMBULATORY_CARE_PROVIDER_SITE_OTHER): Payer: Medicaid Other | Admitting: Psychiatry

## 2017-11-10 VITALS — BP 93/62 | HR 80 | Ht 63.0 in | Wt 227.0 lb

## 2017-11-10 DIAGNOSIS — Z813 Family history of other psychoactive substance abuse and dependence: Secondary | ICD-10-CM

## 2017-11-10 DIAGNOSIS — Z818 Family history of other mental and behavioral disorders: Secondary | ICD-10-CM | POA: Diagnosis not present

## 2017-11-10 DIAGNOSIS — F200 Paranoid schizophrenia: Secondary | ICD-10-CM

## 2017-11-10 DIAGNOSIS — G4700 Insomnia, unspecified: Secondary | ICD-10-CM

## 2017-11-10 MED ORDER — FLUPHENAZINE DECANOATE 25 MG/ML IJ SOLN: 25.0000 mg | INTRAMUSCULAR | 1 refills | Status: DC

## 2017-11-10 NOTE — Progress Notes (Signed)
BH MD/PA/NP OP Progress Note  11/10/2017 1:57 PM Lorraine Bryant  MRN:  [id number]  Chief Complaint: I am doing better with Prolixin.  I am sleeping good.  HPI: Patient is 32 year old African-American single female who was seen by Dr. Harrington Challenger month ago after she released from the hospital.  Patient was admitted to behavioral [place] due to psychotic episode.  She was noncompliant with medication.  Patient diagnosed with paranoid schizophrenia.  Today patient came with her mother who is involved in her treatment plan.  Patient was seeing Dr. Tiburcio Pea for past few years but she was not happy because she has trouble taking medication and having side effects but her medicines were not changed on time.  Eventually she stopped the medication and got decompensated and required hospitalization.  In the hospital she was given Prolixin injection and discontinued olanzapine and Tegretol.  Patient doing much better with Prolixin.  She has no tremors shakes or any EPS.  She had a high prolactin level and she was recommended to see endocrinologist.  She saw a endocrinologist few weeks ago and repeat prolactin level was 46.  She is also scheduled to have neuroimaging studies next Saturday to rule out pituitary growth.  Patient is seeing Ranell Patrick for counseling.  Her mother endorsed that she is sleeping much better and denies any recent paranoia, hallucination, suicidal thoughts or homicidal thought.  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  Currently she is taking time off from school but hoping to restart schooling next month.  Patient and her mother has a lot of question about long-term prognosis, medication side effects and efficacy.  Patient has multiple hospitalization and she like to stay on her current psychoeducation because it is working very well and she has no side effects.  She is sleeping good.  She denies any irritability, anger, mania or any suicidal  thoughts.  Patient denies drinking alcohol or using any illegal substances.  She lives with her mother.  Her energy level is good.  Visit Diagnosis:    ICD-10-CM   1. Paranoid schizophrenia ([place]) F20.0 fluPHENAZine decanoate (PROLIXIN) 25 MG/ML injection    Past Psychiatric History: Reviewed. Patient has multiple psychiatric hospitalization.  In 2013 she was admitted at [facility] due to psychosis and catatonia.  She was again admitted in [place] and in the past 2 years she admitted at old [facility] and behavioral health center.  Patient was prescribed trazodone, Vistaril, Cogentin and Prolixin injection at behavioral health center.  In the past she had tried Haldol, Abilify, olanzapine, Tegretol, [place] injection and [place].  She remembered Abilify make her very restless, Latuda because nauseous and olanzapine causes increased weight gain.  She was seeing Dr. Tiburcio Pea but not happy with the services because she started to have medication side effects and he did not do blood work or change the medication.  Patient has history of suicidal ideation but denies any suicidal attempt.  She admitted history of psychosis, paranoia, delusions.     Past Medical History:  Past Medical History:  Diagnosis Date  . Depression   . Hallucinations   . Psychosis ([place])    per pt and family last year    Past Surgical History:  Procedure Laterality Date  . NO PAST SURGERIES      Family Psychiatric History: Reviewed.  Family History:  Family History  Problem Relation Age of Onset  . Drug abuse Father   . Schizophrenia Maternal Aunt  Social History:  Social History   Socioeconomic History  . Marital status: Single    Spouse name: Not on file  . Number of children: Not on file  . Years of education: Not on file  . Highest education level: Not on file  Occupational History  . Not on file  Social Needs  . Financial resource strain: Not on file  . Food insecurity:     Worry: Not on file    Inability: Not on file  . Transportation needs:    Medical: Not on file    Non-medical: Not on file  Tobacco Use  . Smoking status: Never Smoker  . Smokeless tobacco: Never Used  Substance and Sexual Activity  . Alcohol use: No  . Drug use: No    Comment: unable to assess d/t pt not responding to questioning  . Sexual activity: Never  Lifestyle  . Physical activity:    Days per week: Not on file    Minutes per session: Not on file  . Stress: Not on file  Relationships  . Social connections:    Talks on phone: Not on file    Gets together: Not on file    Attends religious service: Not on file    Active member of club or organization: Not on file    Attends meetings of clubs or organizations: Not on file    Relationship status: Not on file  Other Topics Concern  . Not on file  Social History Narrative  . Not on file    Allergies:  Allergies  Allergen Reactions  . Abilify [Aripiprazole] Other (See Comments)    Slow moving and hand cramping up    Metabolic Disorder Labs: No results found for: HGBA1C, MPG Lab Results  Component Value Date   PROLACTIN 84.3 (H) 09/01/2017   No results found for: CHOL, TRIG, HDL, CHOLHDL, VLDL, LDLCALC Lab Results  Component Value Date   TSH 0.543 11/27/2011    Therapeutic Level Labs: Recent Results (from the past 2160 hour(s))  Comprehensive metabolic panel     Status: Abnormal   Collection Time: 08/21/17  5:32 AM  Result Value Ref Range   Sodium 136 135 - 145 mmol/L   Potassium 3.8 3.5 - 5.1 mmol/L   Chloride 105 101 - 111 mmol/L   CO2 22 22 - 32 mmol/L   Glucose, Bld 110 (H) 65 - 99 mg/dL   BUN 10 6 - 20 mg/dL   Creatinine, Ser 0.54 0.44 - 1.00 mg/dL   Calcium 9.4 8.9 - 10.3 mg/dL   Total Protein 8.0 6.5 - 8.1 g/dL   Albumin 4.1 3.5 - 5.0 g/dL   AST 34 15 - 41 U/L   ALT 32 14 - 54 U/L   Alkaline Phosphatase 74 38 - 126 U/L   Total Bilirubin 0.7 0.3 - 1.2 mg/dL   GFR calc non Af Amer >60 >60 mL/min    GFR calc Af Amer >60 >60 mL/min    Comment: (NOTE) The eGFR has been calculated using the CKD EPI equation. This calculation has not been validated in all clinical situations. eGFR's persistently <60 mL/min signify possible Chronic Kidney Disease.    Anion gap 9 5 - 15  Ethanol     Status: None   Collection Time: 08/21/17  5:32 AM  Result Value Ref Range   Alcohol, Ethyl (B) <10 <10 mg/dL    Comment:        LOWEST DETECTABLE LIMIT FOR SERUM ALCOHOL IS 10 mg/dL  FOR MEDICAL PURPOSES ONLY   Salicylate level     Status: None   Collection Time: 08/21/17  5:32 AM  Result Value Ref Range   Salicylate Lvl <9.5 2.8 - 30.0 mg/dL  Acetaminophen level     Status: Abnormal   Collection Time: 08/21/17  5:32 AM  Result Value Ref Range   Acetaminophen (Tylenol), Serum <10 (L) 10 - 30 ug/mL    Comment:        THERAPEUTIC CONCENTRATIONS VARY SIGNIFICANTLY. A RANGE OF 10-30 ug/mL MAY BE AN EFFECTIVE CONCENTRATION FOR MANY PATIENTS. HOWEVER, SOME ARE BEST TREATED AT CONCENTRATIONS OUTSIDE THIS RANGE. ACETAMINOPHEN CONCENTRATIONS >150 ug/mL AT 4 HOURS AFTER INGESTION AND >50 ug/mL AT 12 HOURS AFTER INGESTION ARE OFTEN ASSOCIATED WITH TOXIC REACTIONS.   cbc     Status: Abnormal   Collection Time: 08/21/17  5:32 AM  Result Value Ref Range   WBC 10.8 (H) 4.0 - 10.5 K/uL   RBC 5.00 3.87 - 5.11 MIL/uL   Hemoglobin 12.4 12.0 - 15.0 g/dL   HCT 37.8 36.0 - 46.0 %   MCV 75.6 (L) 78.0 - 100.0 fL   MCH 24.8 (L) 26.0 - 34.0 pg   MCHC 32.8 30.0 - 36.0 g/dL   RDW 14.3 11.5 - 15.5 %   Platelets 350 150 - 400 K/uL  I-Stat beta hCG blood, ED     Status: None   Collection Time: 08/21/17  5:44 AM  Result Value Ref Range   I-stat hCG, quantitative <5.0 <5 mIU/mL   Comment 3            Comment:   GEST. AGE      CONC.  (mIU/mL)   <=1 WEEK        5 - 50     2 WEEKS       50 - 500     3 WEEKS       100 - 10,000     4 WEEKS     1,000 - 30,000        FEMALE AND NON-PREGNANT FEMALE:     LESS THAN 5  mIU/mL   Prolactin     Status: Abnormal   Collection Time: 09/01/17  6:20 PM  Result Value Ref Range   Prolactin 84.3 (H) 4.8 - 23.3 ng/mL    Comment: (NOTE) Performed At: [facility] [place], [place] [id number] Rush Farmer MD NU:[id number] Performed at [facility], [place] [address], [place], [place] [address]    No results found for: LITHIUM No results found for: VALPROATE No components found for:  CBMZ  Current Medications: Current Outpatient Medications  Medication Sig Dispense Refill  . fluPHENAZine (PROLIXIN) 5 MG tablet Take 1 tablet (5 mg total) by mouth 2 (two) times daily. For mood control 60 tablet 2  . fluPHENAZine decanoate (PROLIXIN) 25 MG/ML injection Inject 1 mL (25 mg total) into the muscle every 14 (fourteen) days. For mood control 5 mL 10  . benztropine (COGENTIN) 0.5 MG tablet Take 1 tablet (0.5 mg total) by mouth 2 (two) times daily as needed for tremors (EPS). (Patient not taking: Reported on 11/10/2017) 60 tablet 0  . hydrOXYzine (ATARAX/VISTARIL) 25 MG tablet Take 1 tablet (25 mg total) by mouth every 6 (six) hours as needed for anxiety. (Patient not taking: Reported on 11/10/2017) 60 tablet 0  . traZODone (DESYREL) 100 MG tablet Take 1 tablet (100 mg total) by mouth at bedtime as needed for sleep. (Patient not taking:  Reported on 11/10/2017) 30 tablet 0   Current Facility-Administered Medications  Medication Dose Route Frequency Provider Last Rate Last Dose  . fluPHENAZine decanoate (PROLIXIN) injection 25 mg  25 mg Intramuscular Q14 Days Cloria Spring, MD   25 mg at 10/27/17 1501     Musculoskeletal: Strength & Muscle Tone: within normal limits Gait & Station: normal Patient leans: N/A  Psychiatric Specialty Exam: Review of Systems  Constitutional: Negative.   HENT: Negative.   Respiratory: Negative.   Cardiovascular: Negative.   Musculoskeletal: Negative.   Skin: Negative.     Blood pressure 93/62,  pulse 80, height '5\' 3"'$  (1.6 m), weight 227 lb (103 kg), SpO2 100 %.Body mass index is 40.21 kg/m.  General Appearance: Casual  Eye Contact:  Good  Speech:  Clear and Coherent  Volume:  Normal  Mood:  Euthymic  Affect:  Appropriate  Thought Process:  Goal Directed  Orientation:  Full (Time, Place, and Person)  Thought Content: Logical   Suicidal Thoughts:  No  Homicidal Thoughts:  No  Memory:  Immediate;   Good Recent;   Good Remote;   Fair  Judgement:  Fair  Insight:  Fair  Psychomotor Activity:  Normal  Concentration:  Concentration: Fair and Attention Span: Fair  Recall:  [employer] of Knowledge: Good  Language: Good  Akathisia:  No  Handed:  Right  AIMS (if indicated): not done  Assets:  Communication Skills Desire for Improvement Housing Resilience Social Support  ADL's:  Intact  Cognition: WNL  Sleep:  Good   Screenings: AIMS     Admission (Discharged) from 08/21/2017 in [place] [facility]  AIMS Total Score  0    AUDIT     Admission (Discharged) from 08/21/2017 in [place] [facility]  Alcohol Use Disorder Identification Test Final Score (AUDIT)  0    GAD-7     Office Visit from 07/02/2017 in [place] for [place]  Total GAD-7 Score  0    PHQ2-9     Office Visit from 07/02/2017 in [place] for [place]  PHQ-2 Total Score  0  PHQ-9 Total Score  0       Assessment and Plan: Schizophrenia chronic paranoid type.  I review her symptoms, history, collateral information from other provider, recent blood work results and current medication.  Patient no longer taking Cogentin, trazodone and Vistaril.  She likes Prolixin which is helping her thinking, paranoia and sleep.  She does not want oral medication and preferred to continue injections.  However she missed last dose of Prolixin but agreed to have injection today.  We discussed in length medication side effects and benefits.  Patient  and her mother has a lot of question about long-term prognosis, side effects of other medication and efficacy.  We discussed and answered these questions and they appear to be satisfied.  I encouraged to continue counseling with Clayton Bibles for CBT.  Patient is going to have CT scan next Saturday recommended by endocrinologist to rule out pituitary adenoma.  Recommended to have her blood work results and neuroimaging studies results faxed to [place].  Recommended to call us back if he has any question, concern if worsening of the symptoms.  Discussed safety concerns at any time having active suicidal thoughts or homicidal thought then she need to call 911 or go to local emergency room.  Follow-up in 2 months. Time spent 25 minutes.  More than 50% of the time spent in psychoeducation,  counseling and coordination of care.  Discuss safety plan that anytime having active suicidal thoughts or homicidal thoughts then patient need to call 911 or go to the local emergency room.     Kathlee Nations, MD 11/10/2017, 1:57 PM

## 2017-11-10 NOTE — Progress Notes (Signed)
Patient presented with appropriate affect for her tri-weekly injection of Prolixin 25 mg/mL. Patient reports no hallucinations and no suicidal thoughts. She reports no side effects and a good appetite. Injection was prepared as ordered and 1 ml was administered IM in the right deltoid. Patient tolerated the procedure without complaint and will be back in 3 weeks for her next injection.

## 2017-11-15 ENCOUNTER — Ambulatory Visit
Admission: RE | Admit: 2017-11-15 | Discharge: 2017-11-15 | Disposition: A | Discharge status: Home / Self Care | Payer: Medicaid Other | Source: Ambulatory Visit | Attending: Internal Medicine | Admitting: Internal Medicine

## 2017-11-15 DIAGNOSIS — N643 Galactorrhea not associated with childbirth: Secondary | ICD-10-CM

## 2017-11-15 DIAGNOSIS — E221 Hyperprolactinemia: Secondary | ICD-10-CM

## 2017-11-15 MED ORDER — GADOBENATE DIMEGLUMINE 529 MG/ML IV SOLN
10.0000 mL | Freq: Once | INTRAVENOUS | Status: AC | PRN
  Administered 2017-11-15: 10 mL via INTRAVENOUS

## 2017-11-18 ENCOUNTER — Ambulatory Visit (HOSPITAL_COMMUNITY): Payer: Self-pay | Admitting: Licensed Clinical Social Worker

## 2017-11-18 ENCOUNTER — Ambulatory Visit (INDEPENDENT_AMBULATORY_CARE_PROVIDER_SITE_OTHER): Payer: Medicaid Other | Admitting: Licensed Clinical Social Worker

## 2017-11-18 DIAGNOSIS — F200 Paranoid schizophrenia: Secondary | ICD-10-CM

## 2017-11-20 ENCOUNTER — Encounter (HOSPITAL_COMMUNITY): Payer: Self-pay | Admitting: Licensed Clinical Social Worker

## 2017-11-20 NOTE — Progress Notes (Deleted)
e

## 2017-11-20 NOTE — Progress Notes (Signed)
   THERAPIST PROGRESS NOTE  Session Time: 11-12  Participation Level: Active  Behavioral Response: Neat and Well GroomedAlertEuthymic  Type of Therapy: Family Therapy  Treatment Goals addressed: Diagnosis: Schizophrenia  Interventions: CBT, Psychosocial Skills: Medication management, distress tolerance and Supportive  Summary: Lorraine Bryant is a 32 y.o. female who presents with paranoid Schizophrenia w/ hx of delusions and hallucinations related to "spiritual warfare". She presents today w/ her mother in session. Pt reports she is continuing to feel stable and denies depression or anxiety sxs. Pt's mother states that pt continues to exhibit very mild catatonia-like sxs, including open mouth, staring, and glazed eyes. Pt smiles when mother discusses these sxs. Pt replies, "sometimes I'm just so in the spiritual realm that I don't pay attention to those things". Counselor asks pt and pt mother to discuss healthy goals for treatment. Pt states she wants to remain compliant on her medications and continue to work on her ability to tolerate stress.  Pt discloses that she no longer desires to move to [place] this summer, and, instead, wants to work on her mental health and try to move once school is finished. Pt states she will most likely remain out of school for summer and work on her Ambulance person.  Counselor spends time inquiring about pt's ADL's and time-management. Pt's mother states that they spend most of the day together completing errands or domestic tasks while attending real-estate classes together at night.  Pt's mother states she wants pt's facial blankness "to be fixed" before she is d/c. Counselor reminds pt that this dx is often chronic and needs to be managed for the lifetime.  Suicidal/Homicidal: Nowithout intent/plan  Therapist Response: Counselor met for a family session w/ pt and pt's mother. Counselor used open questions, information gathering, goal setting,  affirmations, active listening, and empathy.  Plan: Return again in 2 weeks.  Diagnosis:    ICD-10-CM   1. Paranoid schizophrenia ([place]) F20.0        Archie Balboa, LCAS-A 11/20/2017

## 2017-12-01 ENCOUNTER — Ambulatory Visit (INDEPENDENT_AMBULATORY_CARE_PROVIDER_SITE_OTHER): Payer: Medicaid Other

## 2017-12-01 DIAGNOSIS — F200 Paranoid schizophrenia: Secondary | ICD-10-CM

## 2017-12-01 NOTE — Progress Notes (Signed)
Patient presented with flat affect for her tri-weekly injection of Prolixin 25 mg/mL. Patient reports no hallucinations and no suicidal thoughts. Patients mother said she is hearing voices and have made an appointment with Dr. Lolly MustacheArfeen for tomorrow. She reports no side effects and a good appetite. Injection was prepared as ordered and 1 ml was administered IM in the left deltoid. Patient tolerated the procedure without complaint and will be back in 3 weeks for her next injection.

## 2017-12-02 ENCOUNTER — Ambulatory Visit (INDEPENDENT_AMBULATORY_CARE_PROVIDER_SITE_OTHER): Payer: Medicaid Other | Admitting: Psychiatry

## 2017-12-02 ENCOUNTER — Encounter (HOSPITAL_COMMUNITY): Payer: Self-pay | Admitting: Psychiatry

## 2017-12-02 VITALS — BP 108/75 | HR 72 | Ht 63.0 in | Wt 228.0 lb

## 2017-12-02 DIAGNOSIS — F419 Anxiety disorder, unspecified: Secondary | ICD-10-CM

## 2017-12-02 DIAGNOSIS — F200 Paranoid schizophrenia: Secondary | ICD-10-CM | POA: Diagnosis not present

## 2017-12-02 DIAGNOSIS — Z818 Family history of other mental and behavioral disorders: Secondary | ICD-10-CM | POA: Diagnosis not present

## 2017-12-02 DIAGNOSIS — G4700 Insomnia, unspecified: Secondary | ICD-10-CM

## 2017-12-02 DIAGNOSIS — Z813 Family history of other psychoactive substance abuse and dependence: Secondary | ICD-10-CM

## 2017-12-02 MED ORDER — HYDROXYZINE PAMOATE 25 MG PO CAPS: 25.0000 mg | ORAL_CAPSULE | Freq: Every evening | ORAL | 0 refills | Status: DC | PRN

## 2017-12-02 MED ORDER — PALIPERIDONE PALMITATE 156 MG/ML IM SUSP: 156.0000 mg | INTRAMUSCULAR | 0 refills | Status: DC

## 2017-12-02 NOTE — Progress Notes (Signed)
BH MD/PA/NP OP Progress Note  12/02/2017 1:16 PM Lorraine Bryant  MRN:  [id number]  Chief Complaint: I am hearing voices and get very paranoid around people.  HPI: Patient came for her follow-up appointment with her mother.  She came earlier than her scheduled appointment.  She is getting Prolixin 25 mg intramuscular and she supposed to get every 2 weeks but she received her last injection after her third week.  Patient admitted that she is hearing voices, feeling paranoid and some mild difficulty sleeping.  She is not comfortable around people.  She like to go back on Invega stent which she took once when she was seeing Dr. Lenore Cordia.  Patient has history of noncompliance with medication.  Her mother endorsed that lately she has been talking to herself but sometimes inappropriate smile and giggling.  She is seeing Gar Ponto for counseling.  Patient is not sure why she was given Prolixin after third week.  She is complaining of breast discharge but that does not bother her.  She saw her endocrinologist and prolactin level was 46.  She has MRI and she was told everything is normal.  Patient denies any suicidal thoughts or homicidal thought.  She denies any agitation, anger, crying spells or any feeling of hopelessness or worthlessness.  Energy level is fair.  Patient denies drinking alcohol or using any illegal substances.  She lives with her mother who is very supportive  Visit Diagnosis:    ICD-10-CM   1. Paranoid schizophrenia ([facility]) F20.0 paliperidone (INVEGA SUSTENNA) 156 MG/ML SUSP injection    hydrOXYzine (VISTARIL) 25 MG capsule    Past Psychiatric History: Reviewed Patient has multiple psychiatric hospitalization.  She was admitted at [facility], [place], old [place] and behavioral center.  Patient was prescribed trazodone, Vistaril, Cogentin and Prolixin injection at behavioral health center.  In the past she had tried Haldol, Abilify, olanzapine, Tegretol, [place] injection  and [place].  She remembered Abilify make her very restless, Latuda cause nauseous and olanzapine causes increased weight gain.  She was seeing Dr. Lenore Cordia but not happy with the services because she started to have medication side effects and he did not do blood work or change the medication.  Patient has history of suicidal ideation, psychosis, paranoia and delusions.      Past Medical History:  Past Medical History:  Diagnosis Date  . Depression   . Hallucinations   . Psychosis ([facility])    per pt and family last year    Past Surgical History:  Procedure Laterality Date  . NO PAST SURGERIES      Family Psychiatric History: Reviewed  Family History:  Family History  Problem Relation Age of Onset  . Drug abuse Father   . Schizophrenia Maternal Aunt     Social History:  Social History   Socioeconomic History  . Marital status: Single    Spouse name: Not on file  . Number of children: Not on file  . Years of education: Not on file  . Highest education level: Not on file  Occupational History  . Not on file  Social Needs  . Financial resource strain: Not on file  . Food insecurity:    Worry: Not on file    Inability: Not on file  . Transportation needs:    Medical: Not on file    Non-medical: Not on file  Tobacco Use  . Smoking status: Never Smoker  . Smokeless tobacco: Never Used  Substance and Sexual Activity  . Alcohol use:  No  . Drug use: No    Comment: unable to assess d/t pt not responding to questioning  . Sexual activity: Never  Lifestyle  . Physical activity:    Days per week: Not on file    Minutes per session: Not on file  . Stress: Not on file  Relationships  . Social connections:    Talks on phone: Not on file    Gets together: Not on file    Attends religious service: Not on file    Active member of club or organization: Not on file    Attends meetings of clubs or organizations: Not on file    Relationship status: Not on file  Other Topics  Concern  . Not on file  Social History Narrative  . Not on file    Allergies:  Allergies  Allergen Reactions  . Abilify [Aripiprazole] Other (See Comments)    Slow moving and hand cramping up    Metabolic Disorder Labs: No results found for: HGBA1C, MPG Lab Results  Component Value Date   PROLACTIN 84.3 (H) 09/01/2017   No results found for: CHOL, TRIG, HDL, CHOLHDL, VLDL, LDLCALC Lab Results  Component Value Date   TSH 0.543 11/27/2011    Therapeutic Level Labs: No results found for: LITHIUM No results found for: VALPROATE No components found for:  CBMZ  Current Medications: Current Outpatient Medications  Medication Sig Dispense Refill  . fluPHENAZine decanoate (PROLIXIN) 25 MG/ML injection Inject 1 mL (25 mg total) into the muscle every 14 (fourteen) days. For mood control 5 mL 1   Current Facility-Administered Medications  Medication Dose Route Frequency Provider Last Rate Last Dose  . fluPHENAZine decanoate (PROLIXIN) injection 25 mg  25 mg Intramuscular Q14 Days Myrlene Brokeross, Deborah R, MD   25 mg at 12/01/17 1329     Musculoskeletal: Strength & Muscle Tone: within normal limits Gait & Station: normal Patient leans: N/A  Psychiatric Specialty Exam: Review of Systems  Constitutional: Negative.   HENT: Negative.   Respiratory: Negative.   Cardiovascular: Negative.   Genitourinary: Negative.   Skin: Negative.   Psychiatric/Behavioral: Positive for hallucinations.    Blood pressure 108/75, pulse 72, height 5\' 3"  (1.6 m), weight 228 lb (103.4 kg), SpO2 100 %.There is no height or weight on file to calculate BMI.  General Appearance: Casual  Eye Contact:  Good  Speech:  Slow  Volume:  Normal  Mood:  Anxious  Affect:  Appropriate  Thought Process:  Goal Directed  Orientation:  Full (Time, Place, and Person)  Thought Content: Hallucinations: Auditory and Paranoid Ideation   Suicidal Thoughts:  No  Homicidal Thoughts:  No  Memory:  Immediate;   Fair Recent;    Good Remote;   Good  Judgement:  Good  Insight:  Good  Psychomotor Activity:  Decreased  Concentration:  Concentration: Fair and Attention Span: Fair  Recall:  [employer]  Fund of Knowledge: Good  Language: Good  Akathisia:  No  Handed:  Right  AIMS (if indicated): not done  Assets:  Communication Skills Desire for Improvement Housing Resilience Social Support  ADL's:  Intact  Cognition: WNL  Sleep:  Fair   Screenings: AIMS     [facility]  AIMS Total Score  0    AUDIT     [facility]  Alcohol Use Disorder Identification Test Final Score (AUDIT)  0    GAD-7  [facility]  Total GAD-7 Score  0    PHQ2-9     [facility] 07/02/2017 in [facility]  PHQ-2 Total Score  0  PHQ-9 Total Score  0       Assessment and Plan: Schizophrenia chronic paranoid type.  Patient is experiencing increased paranoia and hallucination.  Mother is concerned that some nights she does not sleep well.  She supposed to get Prolixin every 2 weeks but last injection she received after her third week.  Patient wants to go back on [place] which she received once at Dr. Lenore Cordia office and did well.  I will discontinue Prolixin and we will start in Vega cisterna 156 mg and then gradually increase the dose.  Encouraged to keep appointment with Buelah Manis for therapy.  I also add low-dose Vistaril to help with anxiety and insomnia.  We will get records from endocrinologist as patient recently had an MRI of her brain to rule out pituitary growth.  Her repeat prolactin level was 46 which was done at endocrinologist office.  Recommended to call us back if she has any question, concern if she feels worsening of the symptoms.  Follow-up in 4 weeks.Time spent 25 minutes.  More than  50% of the time spent in psychoeducation, counseling and coordination of care.  Discuss safety plan that anytime having active suicidal thoughts or homicidal thoughts then patient need to call 911 or go to the local emergency room.     Cleotis Nipper, MD 12/02/2017, 1:16 PM

## 2017-12-09 ENCOUNTER — Ambulatory Visit (INDEPENDENT_AMBULATORY_CARE_PROVIDER_SITE_OTHER): Payer: Medicaid Other | Admitting: Licensed Clinical Social Worker

## 2017-12-09 ENCOUNTER — Encounter (HOSPITAL_COMMUNITY): Payer: Self-pay

## 2017-12-09 ENCOUNTER — Encounter (HOSPITAL_COMMUNITY): Payer: Self-pay | Admitting: Licensed Clinical Social Worker

## 2017-12-09 DIAGNOSIS — F200 Paranoid schizophrenia: Secondary | ICD-10-CM

## 2017-12-09 NOTE — Progress Notes (Signed)
Pt arrived to lobby 20 min late requesting to be seen by counselor because "she really has something to tell him". Counselor agreed to sit for 5 min and discuss any acute safety issues of SI or HI which pt denied outright. Pt stated that her "voices are returning and she is being told by her 'husband' to have an affair to confuse the demons". Counselor encouraged pt to allow medications to take effect before talk therapy could really "have any effectiveness". Pt insisted the therapy is working/helping. Counselor agreed to continue to meet pt for future appointments as long as she was on time. Pt verbalizes understanding.

## 2017-12-15 ENCOUNTER — Ambulatory Visit (INDEPENDENT_AMBULATORY_CARE_PROVIDER_SITE_OTHER): Payer: Medicaid Other

## 2017-12-15 DIAGNOSIS — F200 Paranoid schizophrenia: Secondary | ICD-10-CM

## 2017-12-15 MED ORDER — PALIPERIDONE PALMITATE ER 156 MG/ML IM SUSY
156.0000 mg | PREFILLED_SYRINGE | Freq: Once | INTRAMUSCULAR | Status: AC
  Administered 2017-12-15: 156 mg via INTRAMUSCULAR

## 2017-12-15 MED ORDER — PALIPERIDONE PALMITATE ER 234 MG/1.5ML IM SUSY: 234.0000 mg | PREFILLED_SYRINGE | Freq: Once | INTRAMUSCULAR | 2 refills | Status: DC

## 2017-12-15 NOTE — Progress Notes (Signed)
Patient presented today with appropriate affect and good mood for her first injection of Invega Sustenna 156 mg injection. Patient is reporting no hallucinations or suicidal thoughts. Invega Sustenna 156 mg injection prepared as ordered and administered in the patients right deltoid. Patient tolerated well and without complaint, she will return in a month for her next injection of Invega 234 mg injection. Patient will call if any questions.

## 2017-12-16 ENCOUNTER — Ambulatory Visit (HOSPITAL_COMMUNITY): Payer: Medicaid Other | Admitting: Psychiatry

## 2017-12-19 ENCOUNTER — Ambulatory Visit (HOSPITAL_COMMUNITY)
Admission: RE | Admit: 2017-12-19 | Discharge: 2017-12-19 | Disposition: A | Discharge status: Home / Self Care | Payer: Medicaid Other | Attending: Psychiatry | Admitting: Psychiatry

## 2017-12-19 DIAGNOSIS — F209 Schizophrenia, unspecified: Secondary | ICD-10-CM | POA: Insufficient documentation

## 2017-12-19 NOTE — BH Assessment (Signed)
Assessment Note  Lorraine Bryant is an 32 y.o. female presents to [facility] voluntarily with her mother. Pt states her hallucinations have been worse the past few weeks. Pt reports she just received Invega injection. Pt denies homicidal thoughts or physical aggression. Pt denies having access to firearms. Pt denies having any legal problems at this time. Pt denies SI currently or at any time in the past. Pt denies any history of suicide attempts and denies history of self-mutilation. Pt denies any current or past substance abuse problems. Pt does not appear to be intoxicated or in withdrawal at this time. Pt states she is stressed about school. Pt is in grad school. Pt lives with her mother and denies hx of abuse. Pt has a psychiatrist Dr. Beacher May but does not have a therapist currently.   Pt is dressed in street clothes, alert, oriented x4 with normal speech and normal motor behavior. Eye contact is good and Pt is pleasant. Pt's mood is depressed and affect is anxious. Thought process is coherent and relevant. Pt's insight is fair and judgement is partial. . Pt was cooperative throughout assessment.     Diagnosis: F20.9 Schizophrenia   Past Medical History:  Past Medical History:  Diagnosis Date  . Depression   . Hallucinations   . Psychosis (HCC)    per pt and family last year    Past Surgical History:  Procedure Laterality Date  . NO PAST SURGERIES      Family History:  Family History  Problem Relation Age of Onset  . Drug abuse Father   . Schizophrenia Maternal Aunt     Social History:  reports that she has never smoked. She has never used smokeless tobacco. She reports that she does not drink alcohol or use drugs.  Additional Social History:  Alcohol / Drug Use Pain Medications: see PTA meds Prescriptions: see PTA meds Over the Counter: see PTA meds History of alcohol / drug use?: No history of alcohol / drug abuse  CIWA: CIWA-Ar BP: 104/84 Pulse Rate: 77 COWS:     Allergies:  Allergies  Allergen Reactions  . Abilify [Aripiprazole] Other (See Comments)    Slow moving and hand cramping up    Home Medications:  (Not in a hospital admission)  OB/GYN Status:  No LMP recorded.  General Assessment Data Location of Assessment: [facility] TTS Assessment: In system Is this a Tele or Face-to-Face Assessment?: Face-to-Face Is this an Initial Assessment or a Re-assessment for this encounter?: Initial Assessment Marital status: Single Is patient pregnant?: No Pregnancy Status: No Living Arrangements: Parent Can pt return to current living arrangement?: Yes Admission Status: Voluntary Is patient capable of signing voluntary admission?: Yes Referral Source: Self/Family/Friend Insurance type: Surveyor, minerals Exam [facility] Walk-in ONLY) Medical Exam completed: Yes  Crisis Care Plan Living Arrangements: Parent Name of Psychiatrist: Arfeen Name of Therapist: None  Education Status Is patient currently in school?: Yes Current Grade: Grad school Highest grade of school patient has completed: BA  Risk to self with the past 6 months Suicidal Ideation: No Has patient been a risk to self within the past 6 months prior to admission? : No Suicidal Intent: No Has patient had any suicidal intent within the past 6 months prior to admission? : No Is patient at risk for suicide?: No Suicidal Plan?: No Has patient had any suicidal plan within the past 6 months prior to admission? : No Access to Means: No What has been your use of drugs/alcohol within the  last 12 months?: None Previous Attempts/Gestures: No Intentional Self Injurious Behavior: None Family Suicide History: No Recent stressful life event(s): Other (Comment)(Pressures of school) Persecutory voices/beliefs?: No Depression: Yes Depression Symptoms: Despondent, Fatigue, Isolating Substance abuse history and/or treatment for substance abuse?: No Suicide prevention  information given to non-admitted patients: Not applicable  Risk to Others within the past 6 months Homicidal Ideation: No Does patient have any lifetime risk of violence toward others beyond the six months prior to admission? : No Thoughts of Harm to Others: No Current Homicidal Intent: No Current Homicidal Plan: No Access to Homicidal Means: No History of harm to others?: No Assessment of Violence: None Noted Violent Behavior Description: None Does patient have access to weapons?: No Criminal Charges Pending?: No Does patient have a court date: No Is patient on probation?: No  Psychosis Hallucinations: Auditory Delusions: None noted  Mental Status Report Appearance/Hygiene: Unremarkable Eye Contact: Good Motor Activity: Freedom of movement Speech: Logical/coherent Level of Consciousness: Alert Mood: Depressed Affect: Anxious Anxiety Level: Minimal Thought Processes: Coherent, Relevant Judgement: Partial Orientation: Person, Place, Time, Situation, Appropriate for developmental age Obsessive Compulsive Thoughts/Behaviors: None  Cognitive Functioning Concentration: Normal Memory: Recent Intact Is patient IDD: No Is patient DD?: No Insight: Fair Impulse Control: Fair Appetite: Good Have you had any weight changes? : No Change Sleep: No Change Total Hours of Sleep: 7 Vegetative Symptoms: None  ADLScreening [facility]) Patient's cognitive ability adequate to safely complete daily activities?: Yes Patient able to express need for assistance with ADLs?: Yes Independently performs ADLs?: Yes (appropriate for developmental age)  Prior Inpatient Therapy Prior Inpatient Therapy: Yes Prior Therapy Dates: 1/19 Prior Therapy Facilty/Provider(s): [facility] Reason for Treatment: Schizophrenia  Prior Outpatient Therapy Prior Outpatient Therapy: No Does patient have an ACCT team?: No Does patient have Intensive In-House Services?  : No Does patient have [facility]  services? : No Does patient have P4CC services?: No  ADL Screening (condition at time of admission) Patient's cognitive ability adequate to safely complete daily activities?: Yes Is the patient deaf or have difficulty hearing?: No Does the patient have difficulty seeing, even when wearing glasses/contacts?: No Does the patient have difficulty concentrating, remembering, or making decisions?: Yes Patient able to express need for assistance with ADLs?: Yes Does the patient have difficulty dressing or bathing?: No Independently performs ADLs?: Yes (appropriate for developmental age) Does the patient have difficulty walking or climbing stairs?: No Weakness of Legs: None Weakness of Arms/Hands: None  Home Assistive Devices/Equipment Home Assistive Devices/Equipment: None  Therapy Consults (therapy consults require a physician order) PT Evaluation Needed: No OT Evalulation Needed: No SLP Evaluation Needed: No Abuse/Neglect Assessment (Assessment to be complete while patient is alone) Abuse/Neglect Assessment Can Be Completed: Yes Physical Abuse: Denies Verbal Abuse: Denies Sexual Abuse: Denies Exploitation of patient/patient's resources: Denies Self-Neglect: Denies Values / Beliefs Cultural Requests During Hospitalization: None Spiritual Requests During Hospitalization: None Consults Spiritual Care Consult Needed: No Social Work Consult Needed: No Merchant navy officer (For Healthcare) Does Patient Have a Medical Advance Directive?: No Would patient like information on creating a medical advance directive?: No - Patient declined    Additional Information 1:1 In Past 12 Months?: No CIRT Risk: No Elopement Risk: No Does patient have medical clearance?: Yes     Disposition:  Disposition Initial Assessment Completed for this Encounter: Yes Disposition of Patient: Discharge Patient referred to: Outpatient clinic referral   Per Reola Calkins, NP does not meet inpatient criteria.    On Site Evaluation by:  Reviewed with Physician:    Danae Orleans, MA, LPCA 12/19/2017 6:05 PM

## 2017-12-19 NOTE — H&P (Signed)
Behavioral Health Medical Screening Exam  Lorraine Bryant is an 32 y.o. female.  Total Time spent with patient: 20 minutes  Psychiatric Specialty Exam: Physical Exam  Nursing note and vitals reviewed. Constitutional: She is oriented to person, place, and time. She appears well-developed and well-nourished.  Cardiovascular: Normal rate.  Respiratory: Effort normal.  Musculoskeletal: Normal range of motion.  Neurological: She is alert and oriented to person, place, and time.  Skin: Skin is warm.    Review of Systems  Constitutional: Negative.   HENT: Negative.   Eyes: Negative.   Respiratory: Negative.   Cardiovascular: Negative.   Gastrointestinal: Negative.   Genitourinary: Negative.   Musculoskeletal: Negative.   Skin: Negative.   Neurological: Negative.   Endo/Heme/Allergies: Negative.   Psychiatric/Behavioral: Positive for hallucinations. Negative for depression and suicidal ideas.    Blood pressure 104/84, pulse 77, temperature 98.4 F (36.9 C), SpO2 100 %.There is no height or weight on file to calculate BMI.  General Appearance: Casual  Eye Contact:  Good  Speech:  Clear and Coherent and Normal Rate  Volume:  Normal  Mood:  Euthymic  Affect:  Congruent  Thought Process:  Goal Directed and Descriptions of Associations: Intact  Orientation:  Full (Time, Place, and Person)  Thought Content:  Hallucinations: Auditory  Suicidal Thoughts:  No  Homicidal Thoughts:  No  Memory:  Immediate;   Good Recent;   Good Remote;   Good  Judgement:  Good  Insight:  Good  Psychomotor Activity:  Normal  Concentration: Concentration: Good and Attention Span: Good  Recall:  Good  Fund of Knowledge:Good  Language: Good  Akathisia:  No  Handed:  Right  AIMS (if indicated):     Assets:  Communication Skills Desire for Improvement Financial Resources/Insurance Housing Physical Health Social Support Transportation  Sleep:       Musculoskeletal: Strength & Muscle Tone:  within normal limits Gait & Station: normal Patient leans: N/A  Blood pressure 104/84, pulse 77, temperature 98.4 F (36.9 C), SpO2 100 %.  Recommendations:  Based on my evaluation the patient does not appear to have an emergency medical condition.  Patient is cleared by psychiatry and does not meet inpatient treatment requirements Patient agrees to follow up with her outpatient psychiatrist  Maryfrances Bunnell, FNP 12/19/2017, 5:40 PM

## 2017-12-22 ENCOUNTER — Encounter (HOSPITAL_COMMUNITY): Payer: Self-pay | Admitting: Psychiatry

## 2017-12-22 ENCOUNTER — Ambulatory Visit (INDEPENDENT_AMBULATORY_CARE_PROVIDER_SITE_OTHER): Payer: Medicaid Other | Admitting: Psychiatry

## 2017-12-22 DIAGNOSIS — Z818 Family history of other mental and behavioral disorders: Secondary | ICD-10-CM | POA: Diagnosis not present

## 2017-12-22 DIAGNOSIS — Z813 Family history of other psychoactive substance abuse and dependence: Secondary | ICD-10-CM | POA: Diagnosis not present

## 2017-12-22 DIAGNOSIS — F200 Paranoid schizophrenia: Secondary | ICD-10-CM | POA: Diagnosis not present

## 2017-12-22 DIAGNOSIS — F419 Anxiety disorder, unspecified: Secondary | ICD-10-CM | POA: Diagnosis not present

## 2017-12-22 MED ORDER — HYDROXYZINE PAMOATE 50 MG PO CAPS: 50.0000 mg | ORAL_CAPSULE | Freq: Every evening | ORAL | 0 refills | Status: DC | PRN

## 2017-12-22 MED ORDER — PALIPERIDONE ER 3 MG PO TB24: 3.0000 mg | ORAL_TABLET | Freq: Every day | ORAL | 0 refills | Status: DC

## 2017-12-22 NOTE — Progress Notes (Signed)
BH MD/PA/NP OP Progress Note  12/22/2017 3:18 PM Lorraine Bryant  MRN:  [id number]  Chief Complaint: I have difficulty adjusting with in Vega injection.  HPI: Patient came for her follow-up appointment with her mother.  She was given in Justice injection on April 29.  She had requested InVega because she was not happy with Prolixin.  She mentioned Prolixin was causing zombie and sleeping too much.  Though she like Invega but she continued to endorse hallucination, talking to herself and poor sleep.  Her mother endorsed some time she respond to internal stimuli and does not sleep at night.  She feel anxious.  She recently seen as a walk-in for assessment but did not meet criteria for inpatient services.  Patient denies any suicidal thoughts or homicidal thought.  Her energy level is fair.  She denies any agitation, anger but endorsed some time irritability.  She does not want to change in Vega injection but willing to try a higher dose.  Visit Diagnosis:    ICD-10-CM   1. Paranoid schizophrenia (HCC) F20.0 hydrOXYzine (VISTARIL) 50 MG capsule    paliperidone (INVEGA) 3 MG 24 hr tablet    Past Psychiatric History: Reviewed. Patient has multiple psychiatric hospitalization. She was admitted at [facility] [place], [facility] [place] [facility] behavioral center.  Patient was prescribed trazodone, Vistaril, Cogentin and Prolixin injection at behavioral health center. In the past she had tried Haldol, Abilify, olanzapine, Tegretol, Invega Sustenainjection and Latuda. She remembered Abilify make her very restless, Latuda cause nauseous and olanzapine causes increased weight gain. She was seeing Dr. Cletis Media not happy with the services because she started to have medication side effects and he did not do blood work or change the medication. Patient has history of suicidal ideation, psychosis, paranoia and delusions.    Past Medical History:  Past Medical History:  Diagnosis Date  . Depression   .  Hallucinations   . Psychosis (HCC)    per pt and family last year    Past Surgical History:  Procedure Laterality Date  . NO PAST SURGERIES      Family Psychiatric History: Viewed.  Family History:  Family History  Problem Relation Age of Onset  . Drug abuse Father   . Schizophrenia Maternal Aunt     Social History:  Social History   Socioeconomic History  . Marital status: Single    Spouse name: Not on file  . Number of children: Not on file  . Years of education: Not on file  . Highest education level: Not on file  Occupational History  . Not on file  Social Needs  . Financial resource strain: Not on file  . Food insecurity:    Worry: Not on file    Inability: Not on file  . Transportation needs:    Medical: Not on file    Non-medical: Not on file  Tobacco Use  . Smoking status: Never Smoker  . Smokeless tobacco: Never Used  Substance and Sexual Activity  . Alcohol use: No  . Drug use: No    Comment: unable to assess d/t pt not responding to questioning  . Sexual activity: Never  Lifestyle  . Physical activity:    Days per week: Not on file    Minutes per session: Not on file  . Stress: Not on file  Relationships  . Social connections:    Talks on phone: Not on file    Gets together: Not on file    Attends religious service: Not on  file    Active member of club or organization: Not on file    Attends meetings of clubs or organizations: Not on file    Relationship status: Not on file  Other Topics Concern  . Not on file  Social History Narrative  . Not on file    Allergies:  Allergies  Allergen Reactions  . Abilify [Aripiprazole] Other (See Comments)    Slow moving and hand cramping up    Metabolic Disorder Labs: No results found for: HGBA1C, MPG Lab Results  Component Value Date   PROLACTIN 84.3 (H) 09/01/2017   No results found for: CHOL, TRIG, HDL, CHOLHDL, VLDL, LDLCALC Lab Results  Component Value Date   TSH 0.543 11/27/2011     Therapeutic Level Labs: No results found for: LITHIUM No results found for: VALPROATE No components found for:  CBMZ  Current Medications: Current Outpatient Medications  Medication Sig Dispense Refill  . hydrOXYzine (VISTARIL) 50 MG capsule Take 1 capsule (50 mg total) by mouth at bedtime as needed for anxiety. 30 capsule 0  . paliperidone (INVEGA SUSTENNA) 234 MG/1.5ML SUSY injection Inject 234 mg into the muscle once for 1 dose. 1.5 mL 2  . paliperidone (INVEGA) 3 MG 24 hr tablet Take 1 tablet (3 mg total) by mouth daily. 30 tablet 0   No current facility-administered medications for this visit.      Musculoskeletal: Strength & Muscle Tone: within normal limits Gait & Station: normal Patient leans: N/A  Psychiatric Specialty Exam: ROS  Blood pressure 95/64, pulse 88, height  (1.6 m), weight 230 lb (104.3 kg), SpO2 99 %.Body mass index is 40.74 kg/m.  General Appearance: Casual  Eye Contact:  Good  Speech:  Clear and Coherent  Volume:  Normal  Mood:  Anxious  Affect:  Congruent  Thought Process:  Goal Directed  Orientation:  Full (Time, Place, and Person)  Thought Content: Hallucinations: Auditory, Paranoid Ideation and Rumination   Suicidal Thoughts:  No  Homicidal Thoughts:  No  Memory:  Immediate;   Good Recent;   Good Remote;   Good  Judgement:  Good  Insight:  Good  Psychomotor Activity:  Decreased  Concentration:  Concentration: Fair and Attention Span: Fair  Recall:  [employer] of Knowledge: Good  Language: Good  Akathisia:  No  Handed:  Right  AIMS (if indicated): not done  Assets:  Communication Skills Desire for Improvement Housing Resilience Social Support  ADL's:  Intact  Cognition: WNL  Sleep:  Poor   Screenings: AIMS     [facility]  AIMS Total Score  0    AUDIT     [facility]   Alcohol Use Disorder Identification Test Final Score (AUDIT)  0    GAD-7     Office Visit from 07/02/2017 in [facility]  Total GAD-7 Score  0    PHQ2-9     Office Visit from 07/02/2017 in [facility]  PHQ-2 Total Score  0  PHQ-9 Total Score  0       Assessment and Plan: Schizophrenia chronic paranoid type.  Anxiety disorder NOS.  Discussed dose adjustment of Invega injection.  I also reviewed assessment note from behavioral health center.  Recommended to try Invega 234 mg intramuscular every 4 weeks.  I would also add low-dose Invega 3 mg oral tablet for at least a month until she  tolerate and adjust to the new dose.  I will increase Vistaril 50 mg to help insomnia and anxiety.  Encourage to see Sharyne Richters for therapy.  Patient will receive injection on May 9.  Recommended to call us back if she has any question or any concern.  Follow-up in 1 month.   Cleotis Nipper, MD 12/22/2017, 3:18 PM

## 2017-12-26 ENCOUNTER — Ambulatory Visit (INDEPENDENT_AMBULATORY_CARE_PROVIDER_SITE_OTHER): Payer: Medicaid Other

## 2017-12-26 DIAGNOSIS — F200 Paranoid schizophrenia: Secondary | ICD-10-CM | POA: Diagnosis not present

## 2017-12-26 MED ORDER — PALIPERIDONE PALMITATE ER 234 MG/1.5ML IM SUSY
234.0000 mg | PREFILLED_SYRINGE | INTRAMUSCULAR | Status: DC
  Administered 2017-12-26 – 2018-02-27 (×3): 234 mg via INTRAMUSCULAR

## 2017-12-26 NOTE — Progress Notes (Signed)
Patient presented today with appropriate affect and good mood for her first injection of Invega Sustenna 156 mg injection. Patient is reporting no hallucinations or suicidal thoughts. Patient has a knot on the right side from the last injection, I offered again to give the patient her injection in the hip and she refused. She does not want to use the hip. Hinda Glatter Sustenna 234 mg injection prepared as ordered and administered in the patients left deltoid, per patient request. Patient tolerated well and without complaint, she will return in a month for her next injection of Invega 234 mg injection. Patient will call if any questions.

## 2017-12-30 ENCOUNTER — Ambulatory Visit (HOSPITAL_COMMUNITY): Payer: Medicaid Other | Admitting: Psychiatry

## 2017-12-30 ENCOUNTER — Ambulatory Visit (INDEPENDENT_AMBULATORY_CARE_PROVIDER_SITE_OTHER): Payer: Medicaid Other | Admitting: Licensed Clinical Social Worker

## 2017-12-30 DIAGNOSIS — F200 Paranoid schizophrenia: Secondary | ICD-10-CM

## 2017-12-31 ENCOUNTER — Encounter: Payer: Self-pay | Admitting: *Deleted

## 2018-01-02 ENCOUNTER — Encounter (HOSPITAL_COMMUNITY): Payer: Self-pay | Admitting: Licensed Clinical Social Worker

## 2018-01-02 NOTE — Progress Notes (Signed)
   THERAPIST PROGRESS NOTE  Session Time: 3-4  Participation Level: Active  Behavioral Response: Neat and Well GroomedConfusedEuthymic  Type of Therapy: Individual Therapy  Treatment Goals addressed: Diagnosis: Schizophrenia  Interventions: Strength-based, Supportive, Social Optician, dispensing and Reframing  Summary: Steven Veazie is a 32 y.o. female who presents with Schizophrenia w/ spiritual delusions. She is active and engaged in session. She states she feels stable and her mood has been upbeat. 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 Pt spend majority of time discussing her belief that she is "Jesus", she is married to her husband "Clifton Custard, in the spirit" and she is pregnant w/ his baby. Counselor validates pt's beliefs and also states that this Clinical research associate does not endorse these beliefs. Counselor spends time aligning w/ pt and working to stabilize and assess for safety issues. Pt denies any SI or HI and is not acting on delusions.   Suicidal/Homicidal: Nowithout intent/plan  Therapist Response: Counselor used validation, rapport building, reflection, and open questions. I stated clearly that I do not believe pt's delusions, though I believe she believes them. Pt was agreeable to this and stated, "she needs more time to convince me".  Plan: Return again in 2 weeks.  Diagnosis:    ICD-10-CM   1. Paranoid schizophrenia (HCC) F20.0       [name], LCAS-A 01/02/2018

## 2018-01-08 ENCOUNTER — Ambulatory Visit (INDEPENDENT_AMBULATORY_CARE_PROVIDER_SITE_OTHER): Payer: Medicaid Other | Admitting: Psychiatry

## 2018-01-08 ENCOUNTER — Encounter (HOSPITAL_COMMUNITY): Payer: Self-pay | Admitting: Psychiatry

## 2018-01-08 ENCOUNTER — Ambulatory Visit (HOSPITAL_COMMUNITY): Payer: Medicaid Other | Admitting: Psychiatry

## 2018-01-08 DIAGNOSIS — Z813 Family history of other psychoactive substance abuse and dependence: Secondary | ICD-10-CM | POA: Diagnosis not present

## 2018-01-08 DIAGNOSIS — F200 Paranoid schizophrenia: Secondary | ICD-10-CM | POA: Diagnosis not present

## 2018-01-08 DIAGNOSIS — Z818 Family history of other mental and behavioral disorders: Secondary | ICD-10-CM

## 2018-01-08 DIAGNOSIS — F419 Anxiety disorder, unspecified: Secondary | ICD-10-CM

## 2018-01-08 MED ORDER — HYDROXYZINE PAMOATE 25 MG PO CAPS: 25.0000 mg | ORAL_CAPSULE | Freq: Every evening | ORAL | 1 refills | Status: DC | PRN

## 2018-01-08 NOTE — Progress Notes (Signed)
BH MD/PA/NP OP Progress Note  01/08/2018 1:23 PM Lorraine Bryant  MRN:  [id number]  Chief Complaint: I am doing much better.  I like injection.  HPI: Patient came for her follow-up appointment with her mother.  She is getting Invega  injection every 4 weeks.  Her mother reported that she is doing much better.  She denies any irritability, anger, mania, psychosis or any hallucination.  She is sleeping better and she stopped taking the Vistaril.  However she feel some time anxious.  She is also taking in Vega 3 mg by mouth.  She gets some time tired but otherwise there has been no recent hallucination.  She denies any suicidal thoughts or homicidal thought.  She started seeing a therapist with [facility].  She has no tremors, shakes or any EPS.  Her appetite is okay.  Her vital signs are stable.  She do not complaining about breast discharge recently.  Visit Diagnosis:    ICD-10-CM   1. Paranoid schizophrenia (HCC) F20.0 hydrOXYzine (VISTARIL) 25 MG capsule    Past Psychiatric History: Reviewed. Patient has multiple psychiatric hospitalization.She was admitted at [facility], [place], [facility] [place] and behavioral center.Patient was prescribed trazodone, Vistaril, Cogentin and Prolixin injection at behavioral health center. In the past she had tried Haldol, Abilify, olanzapine, Tegretol, Invega Sustenainjection and Latuda. She remembered Abilify make her very restless, Latuda cause nauseous and olanzapine causes increased weight gain. She was seeing Dr. Cletis Media not happy with the services because she started to have medication side effects and he did not do blood work or change the medication. Patient has history of suicidal ideation, psychosis, paranoia and delusions.  Past Medical History:  Past Medical History:  Diagnosis Date  . Depression   . Hallucinations   . Psychosis (HCC)    per pt and family last year    Past Surgical History:  Procedure Laterality Date  .  NO PAST SURGERIES      Family Psychiatric History: Reviewed.  Family History:  Family History  Problem Relation Age of Onset  . Drug abuse Father   . Schizophrenia Maternal Aunt     Social History:  Social History   Socioeconomic History  . Marital status: Single    Spouse name: Not on file  . Number of children: Not on file  . Years of education: Not on file  . Highest education level: Not on file  Occupational History  . Not on file  Social Needs  . Financial resource strain: Not on file  . Food insecurity:    Worry: Not on file    Inability: Not on file  . Transportation needs:    Medical: Not on file    Non-medical: Not on file  Tobacco Use  . Smoking status: Never Smoker  . Smokeless tobacco: Never Used  Substance and Sexual Activity  . Alcohol use: No  . Drug use: No    Comment: unable to assess d/t pt not responding to questioning  . Sexual activity: Never  Lifestyle  . Physical activity:    Days per week: Not on file    Minutes per session: Not on file  . Stress: Not on file  Relationships  . Social connections:    Talks on phone: Not on file    Gets together: Not on file    Attends religious service: Not on file    Active member of club or organization: Not on file    Attends meetings of clubs or organizations: Not on  file    Relationship status: Not on file  Other Topics Concern  . Not on file  Social History Narrative  . Not on file    Allergies:  Allergies  Allergen Reactions  . Abilify [Aripiprazole] Other (See Comments)    Slow moving and hand cramping up    Metabolic Disorder Labs: No results found for: HGBA1C, MPG Lab Results  Component Value Date   PROLACTIN 84.3 (H) 09/01/2017   No results found for: CHOL, TRIG, HDL, CHOLHDL, VLDL, LDLCALC Lab Results  Component Value Date   TSH 0.543 11/27/2011    Therapeutic Level Labs: No results found for: LITHIUM No results found for: VALPROATE No components found for:   CBMZ  Current Medications: Current Outpatient Medications  Medication Sig Dispense Refill  . paliperidone (INVEGA SUSTENNA) 234 MG/1.5ML SUSY injection Inject 234 mg into the muscle once for 1 dose. 1.5 mL 2  . paliperidone (INVEGA) 3 MG 24 hr tablet Take 1 tablet (3 mg total) by mouth daily. 30 tablet 0  . hydrOXYzine (VISTARIL) 50 MG capsule Take 1 capsule (50 mg total) by mouth at bedtime as needed for anxiety. (Patient not taking: Reported on 01/08/2018) 30 capsule 0   Current Facility-Administered Medications  Medication Dose Route Frequency Provider Last Rate Last Dose  . paliperidone (INVEGA SUSTENNA) injection 234 mg  234 mg Intramuscular Q30 days Cleotis Nipper, MD   234 mg at 12/26/17 1144     Musculoskeletal: Strength & Muscle Tone: within normal limits Gait & Station: normal Patient leans: N/A  Psychiatric Specialty Exam: ROS  Blood pressure 122/74, pulse 88, height 5' 2.5" (1.588 m), weight 232 lb (105.2 kg).Body mass index is 41.76 kg/m.  General Appearance: Casual  Eye Contact:  Fair  Speech:  Clear and Coherent  Volume:  Normal  Mood:  Euthymic  Affect:  Congruent  Thought Process:  Goal Directed  Orientation:  Full (Time, Place, and Person)  Thought Content: Logical   Suicidal Thoughts:  No  Homicidal Thoughts:  No  Memory:  Immediate;   Fair Recent;   Fair Remote;   Good  Judgement:  Good  Insight:  Good  Psychomotor Activity:  Normal  Concentration:  Concentration: Fair and Attention Span: Fair  Recall:  [employer] of Knowledge: Good  Language: Good  Akathisia:  No  Handed:  Right  AIMS (if indicated): not done  Assets:  Communication Skills Desire for Improvement Housing Resilience Social Support  ADL's:  Intact  Cognition: WNL  Sleep:  Good   Screenings: AIMS     [facility]  AIMS Total Score  0    AUDIT     [facility]  Alcohol Use Disorder Identification Test Final Score (AUDIT)  0    GAD-7     [facility] Visit from [facility]/02/2017 in [facility]  Total GAD-7 Score  0    PHQ2-9     Office Visit from 07/02/2017 in [facility]  PHQ-2 Total Score  0  PHQ-9 Total Score  0       Assessment and Plan: Schizophrenia chronic paranoid type.  Anxiety disorder NOS.  Patient doing much better since the dose adjusted of Invega ejection.  She is getting 234 mg intramuscular every 4 weeks.  I recommended to discontinue oral Invega 80 mg.  I will also reduce Vistaril to 25 mg to help  with anxiety since 50 mg making her tired.  Recommended to continue to see a therapist Sofie Rower.  Patient is going to [place] for few days to visit her grandparents.  She will get injection once she come back from the trip.  Recommended to call us back if she has any question, concern for feel worsening of the symptoms.  We will do a prolactin level on her next appointment.  Follow-up in 3 months   Cleotis Nipper, MD 01/08/2018, 1:23 PM

## 2018-01-13 ENCOUNTER — Ambulatory Visit (HOSPITAL_COMMUNITY): Payer: Self-pay | Admitting: Licensed Clinical Social Worker

## 2018-01-13 ENCOUNTER — Encounter

## 2018-01-14 ENCOUNTER — Ambulatory Visit (HOSPITAL_COMMUNITY): Payer: Self-pay | Admitting: Psychiatry

## 2018-01-17 ENCOUNTER — Other Ambulatory Visit (HOSPITAL_COMMUNITY): Payer: Self-pay | Admitting: Psychiatry

## 2018-01-17 DIAGNOSIS — F200 Paranoid schizophrenia: Secondary | ICD-10-CM

## 2018-01-22 ENCOUNTER — Other Ambulatory Visit (HOSPITAL_COMMUNITY): Payer: Self-pay | Admitting: Psychiatry

## 2018-01-22 ENCOUNTER — Telehealth (HOSPITAL_COMMUNITY): Payer: Self-pay

## 2018-01-22 NOTE — Telephone Encounter (Signed)
She should continue Risperdal 3 mg at bedtime until to be seen on her next appointment.

## 2018-01-22 NOTE — Telephone Encounter (Signed)
Patients mother is calling because she sees that patient is acting differently, patients mother thinks that the injection is wearing off. She is not due for the injection until Monday and her mother still has the risperidone 3 mg and did give it to the patient last night. Please review and advise, thank you

## 2018-01-23 NOTE — Telephone Encounter (Signed)
I spoke with patients mother and she was agreeable to this plan.

## 2018-01-26 ENCOUNTER — Ambulatory Visit (HOSPITAL_COMMUNITY): Payer: Medicaid Other

## 2018-01-27 ENCOUNTER — Ambulatory Visit (INDEPENDENT_AMBULATORY_CARE_PROVIDER_SITE_OTHER): Payer: Medicaid Other | Admitting: Licensed Clinical Social Worker

## 2018-01-27 DIAGNOSIS — F200 Paranoid schizophrenia: Secondary | ICD-10-CM

## 2018-01-28 ENCOUNTER — Ambulatory Visit (INDEPENDENT_AMBULATORY_CARE_PROVIDER_SITE_OTHER): Payer: Medicaid Other

## 2018-01-28 DIAGNOSIS — F200 Paranoid schizophrenia: Secondary | ICD-10-CM | POA: Diagnosis not present

## 2018-01-28 NOTE — Progress Notes (Signed)
Patient presented today with appropriate affect and good mood for her monthly injection of Invega Sustenna 234 mg. Patients mom reports patient has been having hallucinations - patient disagrees with this. We moved up her follow up with Dr. Lolly MustacheArfeen to Monday. Hinda GlatterInvega Sustenna injection prepared as ordered and administered in the patients right deltoid (per pt. Request) Patient tolerated well and without complaint. Patient will call with any questions

## 2018-01-29 ENCOUNTER — Ambulatory Visit (HOSPITAL_COMMUNITY): Payer: Self-pay | Admitting: Psychiatry

## 2018-01-29 NOTE — Progress Notes (Signed)
Pt did not show for appointment.  Wes Vaudine Dutan, LPCA 

## 2018-02-02 ENCOUNTER — Encounter (HOSPITAL_COMMUNITY): Payer: Self-pay | Admitting: Psychiatry

## 2018-02-02 ENCOUNTER — Other Ambulatory Visit (HOSPITAL_COMMUNITY): Payer: Self-pay

## 2018-02-02 ENCOUNTER — Ambulatory Visit (INDEPENDENT_AMBULATORY_CARE_PROVIDER_SITE_OTHER): Payer: Medicaid Other | Admitting: Psychiatry

## 2018-02-02 VITALS — BP 100/69 | HR 82 | Ht 63.0 in | Wt 239.0 lb

## 2018-02-02 DIAGNOSIS — F200 Paranoid schizophrenia: Secondary | ICD-10-CM | POA: Diagnosis not present

## 2018-02-02 DIAGNOSIS — F411 Generalized anxiety disorder: Secondary | ICD-10-CM

## 2018-02-02 DIAGNOSIS — Z79899 Other long term (current) drug therapy: Secondary | ICD-10-CM

## 2018-02-02 MED ORDER — HYDROXYZINE PAMOATE 25 MG PO CAPS: 25.0000 mg | ORAL_CAPSULE | Freq: Every evening | ORAL | 2 refills | Status: DC | PRN

## 2018-02-02 MED ORDER — PALIPERIDONE ER 3 MG PO TB24: 3.0000 mg | ORAL_TABLET | Freq: Every day | ORAL | 0 refills | Status: DC

## 2018-02-02 MED ORDER — PALIPERIDONE PALMITATE ER 234 MG/1.5ML IM SUSY: 234.0000 mg | PREFILLED_SYRINGE | Freq: Once | INTRAMUSCULAR | 2 refills | Status: DC

## 2018-02-02 NOTE — Progress Notes (Signed)
BH MD/PA/NP OP Progress Note  02/02/2018 12:46 PM Van ClinesJaleesia Coggeshall  MRN:  [id number]  Chief Complaint: I am doing better now.  I was not sleeping when I went to [place].  My mother gave me and wake up pill and that helped me a lot.  HPI: Patient came for her follow-up appointment with her mother.  She received InVega injection on June 12th.  She was given 234 mg.  She is adjusting to the new dose.  Mother endorsed much improvement with the new dose of the medication but during the [place] trip she was excited and given oral InVega pill of 3 mg.  Patient denies any recent hallucination, paranoia or talking to herself.  There are times when she freezes herself but overall doing very well on medication.  She like Vistaril which is helping her sleep.  She really have fun in [place].  Patient denies drinking or using any illegal substances.  She admitted missing appointment to see Mr. ChadWest but like to reschedule soon.  Patient told she has no longer breast discharge and she does not have to change her clothes or wear any breast pad.  Her energy level is good.  Her appetite is okay.  She and her mother like to have Invega 3 mg pill in case she need one for paranoia.  Visit Diagnosis:    ICD-10-CM   1. Paranoid schizophrenia (HCC) F20.0 hydrOXYzine (VISTARIL) 25 MG capsule    paliperidone (INVEGA SUSTENNA) 234 MG/1.5ML SUSY injection    paliperidone (INVEGA) 3 MG 24 hr tablet  2. GAD (generalized anxiety disorder) F41.1     Past Psychiatric History: Reviewed. Patient has multiple psychiatric hospitalization.She was admitted at [facility], [place], [facility] [place] [facility] behavioral center.Patient was prescribed trazodone, Vistaril, Cogentin and Prolixin injection at behavioral health center. In the past she had tried Haldol, Abilify, olanzapine, Tegretol, Invega Sustenainjection and Latuda. She remembered Abilify make her very restless, Latuda cause nauseous and olanzapine causes increased weight  gain. She was seeing Dr. Cletis MediaAkintyobut not happy with the services because she started to have medication side effects and he did not do blood work or change the medication. Patient has history of suicidal ideation, psychosis, paranoia and delusions.  Past Medical History:  Past Medical History:  Diagnosis Date  . Depression   . Hallucinations   . Psychosis (HCC)    per pt and family last year    Past Surgical History:  Procedure Laterality Date  . NO PAST SURGERIES      Family Psychiatric History: Reviewed.  Family History:  Family History  Problem Relation Age of Onset  . Drug abuse Father   . Schizophrenia Maternal Aunt     Social History:  Social History   Socioeconomic History  . Marital status: Single    Spouse name: Not on file  . Number of children: Not on file  . Years of education: Not on file  . Highest education level: Not on file  Occupational History  . Not on file  Social Needs  . Financial resource strain: Not on file  . Food insecurity:    Worry: Not on file    Inability: Not on file  . Transportation needs:    Medical: Not on file    Non-medical: Not on file  Tobacco Use  . Smoking status: Never Smoker  . Smokeless tobacco: Never Used  Substance and Sexual Activity  . Alcohol use: No  . Drug use: No    Comment: unable to  assess d/t pt not responding to questioning  . Sexual activity: Never  Lifestyle  . Physical activity:    Days per week: Not on file    Minutes per session: Not on file  . Stress: Not on file  Relationships  . Social connections:    Talks on phone: Not on file    Gets together: Not on file    Attends religious service: Not on file    Active member of club or organization: Not on file    Attends meetings of clubs or organizations: Not on file    Relationship status: Not on file  Other Topics Concern  . Not on file  Social History Narrative  . Not on file    Allergies:  Allergies  Allergen Reactions  . Abilify  [Aripiprazole] Other (See Comments)    Slow moving and hand cramping up    Metabolic Disorder Labs: No results found for: HGBA1C, MPG Lab Results  Component Value Date   PROLACTIN 84.3 (H) 09/01/2017   No results found for: CHOL, TRIG, HDL, CHOLHDL, VLDL, LDLCALC Lab Results  Component Value Date   TSH 0.543 11/27/2011    Therapeutic Level Labs: No results found for: LITHIUM No results found for: VALPROATE No components found for:  CBMZ  Current Medications: Current Outpatient Medications  Medication Sig Dispense Refill  . hydrOXYzine (VISTARIL) 25 MG capsule Take 1 capsule (25 mg total) by mouth at bedtime as needed for anxiety. 30 capsule 1  . paliperidone (INVEGA SUSTENNA) 234 MG/1.5ML SUSY injection Inject 234 mg into the muscle once for 1 dose. 1.5 mL 2   Current Facility-Administered Medications  Medication Dose Route Frequency Provider Last Rate Last Dose  . paliperidone (INVEGA SUSTENNA) injection 234 mg  234 mg Intramuscular Q30 days Arfeen, Phillips Grout, MD   234 mg at 01/28/18 1433     Musculoskeletal: Strength & Muscle Tone: within normal limits Gait & Station: normal Patient leans: N/A  Psychiatric Specialty Exam: ROS  Height 5\' 3"  (1.6 m), weight 239 lb (108.4 kg).There is no height or weight on file to calculate BMI.  General Appearance: Casual  Eye Contact:  Fair  Speech:  Slow  Volume:  Normal  Mood:  Euthymic  Affect:  Appropriate  Thought Process:  Goal Directed  Orientation:  Full (Time, Place, and Person)  Thought Content: Logical   Suicidal Thoughts:  No  Homicidal Thoughts:  No  Memory:  Immediate;   Fair Recent;   Fair Remote;   Fair  Judgement:  Good  Insight:  Present  Psychomotor Activity:  Normal  Concentration:  Concentration: Fair and Attention Span: Fair  Recall:  Good  Fund of Knowledge: Fair  Language: Good  Akathisia:  No  Handed:  Right  AIMS (if indicated): not done  Assets:  Communication Skills Desire for  Improvement Housing Resilience Social Support  ADL's:  Intact  Cognition: WNL  Sleep:  Good   Screenings: AIMS     [facility]  AIMS Total Score  0    AUDIT     Ad[facility]  Alcohol Use Disorder Identification Test Final Score (AUDIT)  0    GAD-7     Office Visit from 07/02/2017 in [facility]  Total GAD-7 Score  0    PHQ2-9     Office Visit from 07/02/2017 in [facility]  PHQ-2 Total  Score  0  PHQ-9 Total Score  0       Assessment and Plan: Schizophrenia chronic paranoid type.  Generalized anxiety disorder.  Patient doing better since we had increased the dose Invega 34 mg IM every 4 weeks.  She is also sleeping better with Vistaril.  Encouraged to keep appointment with [facility].  I will also do blood work including TSH, hemoglobin A1c and prolactin level.  Patient and her mother like to have Invega 3 mg tablet in case she had paranoia to use as as needed.  We will provide that.  Recommended to call us back if she has any question or any concern.  Follow-up in 3 months.   Cleotis Nipper, MD 02/02/2018, 12:46 PM

## 2018-02-10 ENCOUNTER — Ambulatory Visit (HOSPITAL_COMMUNITY): Payer: Self-pay | Admitting: Licensed Clinical Social Worker

## 2018-02-24 ENCOUNTER — Encounter

## 2018-02-24 ENCOUNTER — Ambulatory Visit (INDEPENDENT_AMBULATORY_CARE_PROVIDER_SITE_OTHER): Payer: Medicaid Other | Admitting: Licensed Clinical Social Worker

## 2018-02-24 DIAGNOSIS — F200 Paranoid schizophrenia: Secondary | ICD-10-CM | POA: Diagnosis not present

## 2018-02-24 LAB — TSH: TSH: 0.96 m[IU]/L

## 2018-02-24 LAB — HEMOGLOBIN A1C
EAG (MMOL/L): 6.6 (calc)
HEMOGLOBIN A1C: 5.8 %{Hb} — AB (ref <5.7)
MEAN PLASMA GLUCOSE: 120 (calc)

## 2018-02-24 LAB — PROLACTIN: Prolactin: 137.1 ng/mL — ABNORMAL HIGH

## 2018-02-25 ENCOUNTER — Ambulatory Visit (HOSPITAL_COMMUNITY): Payer: Self-pay | Admitting: Psychiatry

## 2018-02-25 ENCOUNTER — Encounter (HOSPITAL_COMMUNITY): Payer: Self-pay | Admitting: Licensed Clinical Social Worker

## 2018-02-25 NOTE — Progress Notes (Signed)
   THERAPIST PROGRESS NOTE  Session Time: 3-4  Participation Level: Active  Behavioral Response: Neat and Well GroomedAlertEuthymic  Type of Therapy: Individual Therapy  Treatment Goals addressed: Schizophrenia  Interventions: Psychosocial Skills: CBT for sxs management  Summary: Lorraine ClinesJaleesia Schwer is a 32 y.o. female who presents with Paranoid Schizophrenia. She is active, engaged, and actively delusional during session. She reports she is "God and [place]". When asked about details of her beliefs she states she believes she is "higher than God" and that "she is pregnant w/ God's baby". When asked if she has had sex recently she states "no". When asked whether she believes an ultrasound will reveal that she is pregnant she says "yes it would reveal that I'm pregnant w/ twins". Counselor assesses for command hallucinations which pt denies. Pt denies HI, SI. She states that "people will get punished in [place] but that's not for me to do on this earth". Counselor asks pt's mother to attend final 15 min of session and she agrees. Pt's mother appears argumentative and tries to convince pt of her wrong thinking. When asked pt counselor whether there is any way that pt's beliefs could be wrong, pt states "no, I'm 100% sure it is true, I have to have complete faith".  Suicidal/Homicidal: Nowithout intent/plan  Therapist Response: Counselor used closed questions and validation while communicating to pt that counselor does not agree w/ pt's spiritual beliefs. Pt states these sessions help her relieve stress and "unload" her spiritual burdens.  Plan: Return again in 2 weeks.  Diagnosis:    ICD-10-CM   1. Paranoid schizophrenia (HCC) F20.0        Margo CommonWesley E Alyza Artiaga, LCAS-A 02/25/2018

## 2018-02-26 ENCOUNTER — Ambulatory Visit (HOSPITAL_COMMUNITY): Payer: Self-pay

## 2018-02-27 ENCOUNTER — Ambulatory Visit (INDEPENDENT_AMBULATORY_CARE_PROVIDER_SITE_OTHER): Payer: Medicaid Other

## 2018-02-27 DIAGNOSIS — F200 Paranoid schizophrenia: Secondary | ICD-10-CM

## 2018-02-27 NOTE — Progress Notes (Signed)
Patient presented today with appropriate affect and good mood for her monthly injection of Invega Sustenna 234 mg. Patients mom reports that the medication is wearing off after about 3 weeks and she is giving patient 3 mg of Invega at bedtime during the 4th week. Hinda GlatterInvega Sustenna injection prepared as ordered and administered in the patients right deltoid (per pt. Request) Patient tolerated well and without complaint. Patient will call with any questions

## 2018-03-03 ENCOUNTER — Ambulatory Visit (HOSPITAL_COMMUNITY): Payer: Self-pay | Admitting: Psychiatry

## 2018-03-04 ENCOUNTER — Ambulatory Visit (HOSPITAL_COMMUNITY): Payer: Self-pay | Admitting: Licensed Clinical Social Worker

## 2018-03-04 ENCOUNTER — Other Ambulatory Visit (HOSPITAL_COMMUNITY): Payer: Self-pay | Admitting: Psychiatry

## 2018-03-04 DIAGNOSIS — F200 Paranoid schizophrenia: Secondary | ICD-10-CM

## 2018-03-05 ENCOUNTER — Ambulatory Visit (INDEPENDENT_AMBULATORY_CARE_PROVIDER_SITE_OTHER): Payer: Medicaid Other | Admitting: General Practice

## 2018-03-05 VITALS — BP 132/75 | HR 92 | Wt 255.0 lb

## 2018-03-05 DIAGNOSIS — Z23 Encounter for immunization: Secondary | ICD-10-CM

## 2018-03-05 DIAGNOSIS — Z3202 Encounter for pregnancy test, result negative: Secondary | ICD-10-CM | POA: Diagnosis not present

## 2018-03-05 DIAGNOSIS — N926 Irregular menstruation, unspecified: Secondary | ICD-10-CM | POA: Diagnosis not present

## 2018-03-05 LAB — POCT PREGNANCY, URINE: PREG TEST UR: NEGATIVE

## 2018-03-05 NOTE — Progress Notes (Signed)
Van ClinesJaleesia Ramus here for Gardasil #3  Injection.  Injection administered without complication. Patient requests UPT as she has not had a menstrual cycle since May, reports occasional irregular periods. UPT -. Patient will return as needed for other GYN care.   Marylynn PearsonCarrie Hillman, RN 03/05/2018  2:49 PM

## 2018-03-05 NOTE — Progress Notes (Signed)
I have reviewed the chart and agree with nursing staff's documentation of this patient's encounter.  Catalina AntiguaPeggy Abigayl Hor, MD 03/05/2018 4:48 PM

## 2018-03-11 ENCOUNTER — Ambulatory Visit (HOSPITAL_COMMUNITY): Payer: Self-pay | Admitting: Licensed Clinical Social Worker

## 2018-03-12 ENCOUNTER — Telehealth (HOSPITAL_COMMUNITY): Payer: Self-pay

## 2018-03-12 NOTE — Telephone Encounter (Signed)
Patients mother called, they see Arfeen but have an appointment with you on 8/1. Patients mother has been giving her 50 mg of Hydroxyzine at night because patient has not been sleeping. She has also been giving her the 3 mg Invega because patient is "talking crazy" again. She wanted you to be informed for next weeks appointment.

## 2018-03-16 ENCOUNTER — Other Ambulatory Visit (HOSPITAL_COMMUNITY): Payer: Self-pay

## 2018-03-16 ENCOUNTER — Ambulatory Visit (INDEPENDENT_AMBULATORY_CARE_PROVIDER_SITE_OTHER): Payer: Medicaid Other | Admitting: Student

## 2018-03-16 ENCOUNTER — Encounter: Payer: Self-pay | Admitting: Student

## 2018-03-16 VITALS — BP 119/80 | HR 84 | Ht 63.0 in | Wt 248.9 lb

## 2018-03-16 DIAGNOSIS — Z30016 Encounter for initial prescription of transdermal patch hormonal contraceptive device: Secondary | ICD-10-CM

## 2018-03-16 DIAGNOSIS — F200 Paranoid schizophrenia: Secondary | ICD-10-CM

## 2018-03-16 DIAGNOSIS — N939 Abnormal uterine and vaginal bleeding, unspecified: Secondary | ICD-10-CM

## 2018-03-16 DIAGNOSIS — N643 Galactorrhea not associated with childbirth: Secondary | ICD-10-CM

## 2018-03-16 DIAGNOSIS — Z0000 Encounter for general adult medical examination without abnormal findings: Secondary | ICD-10-CM | POA: Diagnosis not present

## 2018-03-16 DIAGNOSIS — E221 Hyperprolactinemia: Secondary | ICD-10-CM

## 2018-03-16 LAB — POCT URINALYSIS DIP (DEVICE)
BILIRUBIN URINE: NEGATIVE
Glucose, UA: NEGATIVE mg/dL
KETONES UR: NEGATIVE mg/dL
Leukocytes, UA: NEGATIVE
NITRITE: NEGATIVE
PROTEIN: NEGATIVE mg/dL
Specific Gravity, Urine: 1.025 (ref 1.005–1.030)
Urobilinogen, UA: 0.2 mg/dL (ref 0.0–1.0)
pH: 6 (ref 5.0–8.0)

## 2018-03-16 LAB — POCT PREGNANCY, URINE: PREG TEST UR: NEGATIVE

## 2018-03-16 MED ORDER — PALIPERIDONE ER 3 MG PO TB24
3.0000 mg | ORAL_TABLET | Freq: Every day | ORAL | 0 refills | Status: DC
Start: 2018-03-16 — End: 2018-03-19

## 2018-03-16 MED ORDER — NORELGESTROMIN-ETH ESTRADIOL 150-35 MCG/24HR TD PTWK: 1.0000 | MEDICATED_PATCH | TRANSDERMAL | 3 refills | Status: DC

## 2018-03-16 NOTE — Progress Notes (Signed)
Pt has noticed breast milk coming from both breasts for several years. She also has sore nipples and breast tenderness. Pt also has irregular periods. Pt does not know when she had her last Pap test.  Pt's mother is present and helping to provide medical history information.

## 2018-03-16 NOTE — Patient Instructions (Signed)
Prediabetes Eating Plan Prediabetes-also called impaired glucose tolerance or impaired fasting glucose-is a condition that causes blood sugar (blood glucose) levels to be higher than normal. Following a healthy diet can help to keep prediabetes under control. It can also help to lower the risk of type 2 diabetes and heart disease, which are increased in people who have prediabetes. Along with regular exercise, a healthy diet:  Promotes weight loss.  Helps to control blood sugar levels.  Helps to improve the way that the body uses insulin.  What do I need to know about this eating plan?  Use the glycemic index (GI) to plan your meals. The index tells you how quickly a food will raise your blood sugar. Choose low-GI foods. These foods take a longer time to raise blood sugar.  Pay close attention to the amount of carbohydrates in the food that you eat. Carbohydrates increase blood sugar levels.  Keep track of how many calories you take in. Eating the right amount of calories will help you to achieve a healthy weight. Losing about 7 percent of your starting weight can help to prevent type 2 diabetes.  You may want to follow a Mediterranean diet. This diet includes a lot of vegetables, lean meats or fish, whole grains, fruits, and healthy oils and fats. What foods can I eat? Grains Whole grains, such as whole-wheat or whole-grain breads, crackers, cereals, and pasta. Unsweetened oatmeal. Bulgur. Barley. Quinoa. Brown rice. Corn or whole-wheat flour tortillas or taco shells. Vegetables Lettuce. Spinach. Peas. Beets. Cauliflower. Cabbage. Broccoli. Carrots. Tomatoes. Squash. Eggplant. Herbs. Peppers. Onions. Cucumbers. Brussels sprouts. Fruits Berries. Bananas. Apples. Oranges. Grapes. Papaya. Mango. Pomegranate. Kiwi. Grapefruit. Cherries. Meats and Other Protein Sources Seafood. Lean meats, such as chicken and turkey or lean cuts of pork and beef. Tofu. Eggs. Nuts. Beans. Dairy Low-fat or  fat-free dairy products, such as yogurt, cottage cheese, and cheese. Beverages Water. Tea. Coffee. Sugar-free or diet soda. Seltzer water. Milk. Milk alternatives, such as soy or almond milk. Condiments Mustard. Relish. Low-fat, low-sugar ketchup. Low-fat, low-sugar barbecue sauce. Low-fat or fat-free mayonnaise. Sweets and Desserts Sugar-free or low-fat pudding. Sugar-free or low-fat ice cream and other frozen treats. Fats and Oils Avocado. Walnuts. Olive oil. The items listed above may not be a complete list of recommended foods or beverages. Contact your dietitian for more options. What foods are not recommended? Grains Refined white flour and flour products, such as bread, pasta, snack foods, and cereals. Beverages Sweetened drinks, such as sweet iced tea and soda. Sweets and Desserts Baked goods, such as cake, cupcakes, pastries, cookies, and cheesecake. The items listed above may not be a complete list of foods and beverages to avoid. Contact your dietitian for more information. This information is not intended to replace advice given to you by your health care provider. Make sure you discuss any questions you have with your health care provider. Document Released: 12/20/2014 Document Revised: 01/11/2016 Document Reviewed: 08/31/2014 Elsevier Interactive Patient Education  2017 [employer]  

## 2018-03-16 NOTE — Progress Notes (Signed)
  GYNECOLOGY CLINIC ANNUAL PREVENTATIVE CARE ENCOUNTER NOTE  Subjective:   Lorraine Bryant is a 32 y.o. G0P0000 female here for a routine annual gynecologic exam.  Current complaints: breast tenderness & galactorrhea. Patient with schizophrenia & currently taking paliperidone. Has had elevated prolactin level for the last year. Met with endocrinologist & had normal MRI. Elevated prolactin likely drug induced d/t her psych meds.  Patient present with her mother who assists with her history.  Was seen by Dr. Dove 06/2017 for AUB & hyperprolactinemia. Was prescribed ortho evra to regulate her periods (per Dr. Dove's note). Pt states she doesn't recall that being prescribed to her & has not been taking anything for contraception. States she is very interested in starting back on the patch (formerly used it when she was in high school). She has not been sexually active in 3+ years. Reports bleeding irregularly; sometimes skips months. Some episodes are light spotting and others heavier - normally lasts 5 days. Her breast symptoms & mood are worse during menses.    Gynecologic History Patient's last menstrual period was 03/13/2018 (approximate). Contraception: abstinence Last Pap: unknown. Results were: normal   Obstetric History OB History  Gravida Para Term Preterm AB Living  0 0 0 0 0 0  SAB TAB Ectopic Multiple Live Births  0 0 0 0 0    Past Medical History:  Diagnosis Date  . Depression   . Hallucinations   . Psychosis (HCC)    per pt and family last year    Past Surgical History:  Procedure Laterality Date  . NO PAST SURGERIES      Current Outpatient Medications on File Prior to Visit  Medication Sig Dispense Refill  . hydrOXYzine (VISTARIL) 25 MG capsule Take 1 capsule (25 mg total) by mouth at bedtime as needed for anxiety. 30 capsule 2  . paliperidone (INVEGA SUSTENNA) 234 MG/1.5ML SUSY injection Inject 234 mg into the muscle once for 1 dose. 1.5 mL 2   Current  Facility-Administered Medications on File Prior to Visit  Medication Dose Route Frequency Provider Last Rate Last Dose  . paliperidone (INVEGA SUSTENNA) injection 234 mg  234 mg Intramuscular Q30 days Arfeen, Syed T, MD   234 mg at 02/27/18 1158    Allergies  Allergen Reactions  . Abilify [Aripiprazole] Other (See Comments)    Slow moving and hand cramping up    Social History   Socioeconomic History  . Marital status: Single    Spouse name: Not on file  . Number of children: Not on file  . Years of education: Not on file  . Highest education level: Not on file  Occupational History  . Not on file  Social Needs  . Financial resource strain: Not on file  . Food insecurity:    Worry: Not on file    Inability: Not on file  . Transportation needs:    Medical: Not on file    Non-medical: Not on file  Tobacco Use  . Smoking status: Never Smoker  . Smokeless tobacco: Never Used  Substance and Sexual Activity  . Alcohol use: No  . Drug use: No    Comment: unable to assess d/t pt not responding to questioning  . Sexual activity: Never  Lifestyle  . Physical activity:    Days per week: Not on file    Minutes per session: Not on file  . Stress: Not on file  Relationships  . Social connections:    Talks on phone: Not on   file    Gets together: Not on file    Attends religious service: Not on file    Active member of club or organization: Not on file    Attends meetings of clubs or organizations: Not on file    Relationship status: Not on file  . Intimate partner violence:    Fear of current or ex partner: Not on file    Emotionally abused: Not on file    Physically abused: Not on file    Forced sexual activity: Not on file  Other Topics Concern  . Not on file  Social History Narrative  . Not on file    Family History  Problem Relation Age of Onset  . Drug abuse Father   . Schizophrenia Maternal Aunt     The following portions of the patient's history were  reviewed and updated as appropriate: allergies, current medications, past family history, past medical history, past social history, past surgical history and problem list.  Review of Systems Pertinent items are noted in HPI.   Objective:  BP 119/80   Pulse 84   Ht 5' 3" (1.6 m)   Wt 248 lb 14.4 oz (112.9 kg)   LMP 03/13/2018 (Approximate)   BMI 44.09 kg/m  CONSTITUTIONAL: Well-developed, well-nourished female in no acute distress.  HENT:  Normocephalic, atraumatic EYES: Conjunctivae and EOM are normal. No scleral icterus.  NECK: Normal range of motion, supple, no masses.  Normal thyroid.  SKIN: Skin is warm and dry. No rash noted. Not diaphoretic. No erythema. No pallor. [place]: Alert and oriented to person, place, and time. Normal reflexes, muscle tone coordination. No cranial nerve deficit noted. PSYCHIATRIC: delayed verbal responses. Flat affect with occasional inappropriate smile response CARDIOVASCULAR: Normal heart rate noted, regular rhythm RESPIRATORY: Clear to auscultation bilaterally. Effort and breath sounds normal, no problems with respiration noted. BREASTS: Symmetric in size. No masses, skin changes, or lymphadenopathy. Milky nipple discharge from left breast  ABDOMEN: Soft, normal bowel sounds, no distention noted.  No tenderness, rebound or guarding.  PELVIC: declined by patient d/t vaginal bleeding MUSCULOSKELETAL: Normal range of motion. No tenderness.  No cyanosis, clubbing, or edema.  2+ distal pulses.   Assessment:  Annual gynecologic examination without pap smear. Will return for pap smear when not bleeding.    Plan:  1. Well woman exam without gynecological exam -unable to collect pap smear today d/t vaginal bleeding -Pt to return in 6 wks to see Dr. Hulan Fray for pap smear & to reassess symptoms after starting [facility]  2. Encounter for initial prescription of transdermal patch hormonal contraceptive device -Initially ordered by Dr. Hulan Fray but patient never  started. Patient requesting today.  - norelgestromin-ethinyl estradiol (ORTHO EVRA) 150-35 MCG/24HR transdermal patch; Place 1 patch onto the skin once a week.  Dispense: 3 patch; Refill: 3  3. Abnormal uterine bleeding (AUB) & hyperprolactinemia -pt & mother requesting labs today -- ordered but when patient got to phlebotomist she refused blood draw. Pt requesting u/a & UPT --- both negative.     Jorje Guild, NP

## 2018-03-17 DIAGNOSIS — N643 Galactorrhea not associated with childbirth: Secondary | ICD-10-CM | POA: Insufficient documentation

## 2018-03-17 DIAGNOSIS — E221 Hyperprolactinemia: Secondary | ICD-10-CM

## 2018-03-17 HISTORY — DX: Hyperprolactinemia: E22.1

## 2018-03-17 HISTORY — DX: Galactorrhea not associated with childbirth: N64.3

## 2018-03-18 ENCOUNTER — Encounter (HOSPITAL_COMMUNITY): Payer: Self-pay | Admitting: Licensed Clinical Social Worker

## 2018-03-18 ENCOUNTER — Ambulatory Visit (INDEPENDENT_AMBULATORY_CARE_PROVIDER_SITE_OTHER): Payer: Medicaid Other | Admitting: Licensed Clinical Social Worker

## 2018-03-18 DIAGNOSIS — F200 Paranoid schizophrenia: Secondary | ICD-10-CM | POA: Diagnosis not present

## 2018-03-18 NOTE — Progress Notes (Signed)
   THERAPIST PROGRESS NOTE  Session Time: 4-5  Participation Level: Active  Behavioral Response: Casual and Well GroomedAlertEuthymic  Type of Therapy: Individual Therapy  Treatment Goals addressed: Coping  Interventions: CBT and Psychosocial Skills: Communication/ assertiveness  Summary: Lorraine Bryant is a 32 y.o. female who presents with Paranoid Schizophrenia w/ beliefs of self-deification. She states she is excited her grandparents will be coming to live w/ her and her mother from [place]. Pt discussed her conflicts w/ her older sister. Counselor spent time asking pt about their relationship and how this impacted pt's view of self. Pt was agreeable and did not disclose any recent delusions or hallucinations during session.  Pt inquired about a conservatorship and stated she was advised by her professors to seek this service. Counselor and pt explored the resource online and discussed pros and cons. Counselor encouraged pt to consider attending a school that is local to better suit her need for more resources, vs. attending an online for-profit university.   Suicidal/Homicidal: Nowithout intent/plan  Therapist Response: Counselor used open questions, assertiveness training, discussion of how to set boundaries. Encouraged pt to consider her own internal locus of control when making decisions about her actions. Pt expressed need for "someone to help her stay on track in school". Counselor suggested utilizing a smart phone app to increase productivity.  Plan: Return again in 4 weeks.  Diagnosis:    ICD-10-CM   1. Paranoid schizophrenia (HCC) F20.0       Margo CommonWesley E Malikiah Debarr, LCAS-A 03/18/2018

## 2018-03-19 ENCOUNTER — Ambulatory Visit (INDEPENDENT_AMBULATORY_CARE_PROVIDER_SITE_OTHER): Payer: Medicaid Other | Admitting: Psychiatry

## 2018-03-19 ENCOUNTER — Encounter (HOSPITAL_COMMUNITY): Payer: Self-pay | Admitting: Psychiatry

## 2018-03-19 ENCOUNTER — Encounter

## 2018-03-19 DIAGNOSIS — F411 Generalized anxiety disorder: Secondary | ICD-10-CM

## 2018-03-19 DIAGNOSIS — F200 Paranoid schizophrenia: Secondary | ICD-10-CM

## 2018-03-19 MED ORDER — QUETIAPINE FUMARATE 100 MG PO TABS: ORAL_TABLET | ORAL | 2 refills | Status: DC

## 2018-03-19 NOTE — Progress Notes (Signed)
[facility] MD/PA/NP OP Progress Note  03/19/2018 5:26 PM Lorraine Bryant  MRN:  [id number]  Chief Complaint: have to be seen! HPI: Lorraine Bryant presents with mom for a coverage follow up. Presents 20 minutes late for 20 minutes visit. Patients mother was insistant with front desk, demanding for daughter to be seen.  Spent time with the patient and mom.  There is no acute paranoia, no acute auditory or visual hallucinations, no acute suicidality, no substance abuse, no agitated behaviors, no running away from home.  Mom was concerned though that she feels like the paliperidone injection seems to wear away after the first 2 weeks and her daughter seems to usually do better during the first 2 weeks of the injections.   Spent some time discussing the use of 2 antipsychotics together when there is treatment resistance to 1 antipsychotic.  We discussed the use of Seroquel nightly given her ongoing issues with sleep and agreed to discontinue hydroxyzine and discontinue paliperidone tablet.  We agreed to follow-up in 3 weeks and I implored them to please arrive on time for their visit.    Visit Diagnosis:    ICD-10-CM   1. Paranoid schizophrenia (HCC) F20.0 QUEtiapine (SEROQUEL) 100 MG tablet  2. GAD (generalized anxiety disorder) F41.1 QUEtiapine (SEROQUEL) 100 MG tablet    Past Psychiatric History: See intake H&P for full details. Reviewed, with no updates at this time.   Past Medical History:  Past Medical History:  Diagnosis Date  . Depression   . Hallucinations   . Psychosis ([place])    per pt and family last year    Past Surgical History:  Procedure Laterality Date  . NO PAST SURGERIES      Family Psychiatric History: See intake H&P for full details. Reviewed, with no updates at this time.   Family History:  Family History  Problem Relation Age of Onset  . Drug abuse Father   . Schizophrenia Maternal Aunt     Social History:  Social History   Socioeconomic History  . Marital  status: Single    Spouse name: Not on file  . Number of children: Not on file  . Years of education: Not on file  . Highest education level: Not on file  Occupational History  . Not on file  Social Needs  . Financial resource strain: Not on file  . Food insecurity:    Worry: Not on file    Inability: Not on file  . Transportation needs:    Medical: Not on file    Non-medical: Not on file  Tobacco Use  . Smoking status: Never Smoker  . Smokeless tobacco: Never Used  Substance and Sexual Activity  . Alcohol use: No  . Drug use: No    Comment: unable to assess d/t pt not responding to questioning  . Sexual activity: Never  Lifestyle  . Physical activity:    Days per week: Not on file    Minutes per session: Not on file  . Stress: Not on file  Relationships  . Social connections:    Talks on phone: Not on file    Gets together: Not on file    Attends religious service: Not on file    Active member of club or organization: Not on file    Attends meetings of clubs or organizations: Not on file    Relationship status: Not on file  Other Topics Concern  . Not on file  Social History Narrative  . Not on file  Allergies:  Allergies  Allergen Reactions  . Abilify [Aripiprazole] Other (See Comments)    Slow moving and hand cramping up    Metabolic Disorder Labs: Lab Results  Component Value Date   HGBA1C 5.8 (H) 02/23/2018   MPG 120 02/23/2018   Lab Results  Component Value Date   PROLACTIN 137.1 (H) 02/23/2018   PROLACTIN 84.3 (H) 09/01/2017   No results found for: CHOL, TRIG, HDL, CHOLHDL, VLDL, LDLCALC Lab Results  Component Value Date   TSH 0.96 02/23/2018   TSH 0.543 11/27/2011    Therapeutic Level Labs: No results found for: LITHIUM No results found for: VALPROATE No components found for:  CBMZ  Current Medications: Current Outpatient Medications  Medication Sig Dispense Refill  . norelgestromin-ethinyl estradiol (ORTHO EVRA) 150-35 MCG/24HR  transdermal patch Place 1 patch onto the skin once a week. 3 patch 3  . paliperidone (INVEGA SUSTENNA) 234 MG/1.5ML SUSY injection Inject 234 mg into the muscle once for 1 dose. 1.5 mL 2  . QUEtiapine (SEROQUEL) 100 MG tablet Take every night and okay to take another tablet or 1/2 tablet during the day if needed 60 tablet 2   Current Facility-Administered Medications  Medication Dose Route Frequency Provider Last Rate Last Dose  . paliperidone (INVEGA SUSTENNA) injection 234 mg  234 mg Intramuscular Q30 days Kathlee Nations, MD   234 mg at 02/27/18 1158     Musculoskeletal: Strength & Muscle Tone: within normal limits Gait & Station: normal Patient leans: N/A  Psychiatric Specialty Exam: ROS  Last menstrual period 03/13/2018.There is no height or weight on file to calculate BMI.  General Appearance: Casual and Well Groomed  Eye Contact:  Fair  Speech:  Clear and Coherent and Normal Rate  Volume:  Normal  Mood:  Depressed and Dysphoric  Affect:  Blunt, Congruent and Constricted  Thought Process:  Goal Directed and Descriptions of Associations: Intact  Orientation:  Full (Time, Place, and Person)  Thought Content: Paranoid Ideation   Suicidal Thoughts:  No  Homicidal Thoughts:  No  Memory:  Immediate;   Fair  Judgement:  Fair  Insight:  Fair  Psychomotor Activity:  Normal  Concentration:  Concentration: Poor  Recall:  Poor  Fund of Knowledge: Fair  Language: Fair  Akathisia:  Negative  Handed:  Right  AIMS (if indicated): not done  Assets:  Communication Skills Desire for Improvement Financial Resources/Insurance  ADL's:  Intact  Cognition: WNL  Sleep:  Fair   Screenings: AIMS     Admission (Discharged) from 08/21/2017 in [facility]  AIMS Total Score  0    AUDIT     Admission (Discharged) from 08/21/2017 in [facility]  Alcohol Use Disorder Identification Test Final Score (AUDIT)  0    GAD-7      Office Visit from 07/02/2017 in [place] for [facility]  Total GAD-7 Score  0    PHQ2-9     Office Visit from 07/02/2017 in [place] for [place]  PHQ-2 Total Score  0  PHQ-9 Total Score  0       Assessment and Plan:  Lorraine Bryant presents with medication questions related to paranoid schizophrenia.  I do think she would benefit from the use of 2 antipsychotics together and educated the family about the risks of dual antipsychotic.  We also discussed the potential switching her to clozapine given that she has failed a myriad of antipsychotics.  We agreed to proceed as below  and follow-up in 3 weeks.  Notably, the patient's mother was quite pushy about having her seen today although there was no acute or urgent issue.  I spent time with them imploring them to please arrive for their appointments on time or they will not be seen if this happens again.  1. Paranoid schizophrenia ([place])   2. GAD (generalized anxiety disorder)     Status of current problems: unchanged  Labs Ordered: No orders of the defined types were placed in this encounter.   Labs Reviewed: n/a  Collateral Obtained/Records Reviewed: n/a  Plan:  Continue paliperidone monthly Initiate Seroquel 100 mg twice a day rtc 3 weeks  Aundra Dubin, MD 03/19/2018, 5:26 PM

## 2018-03-25 ENCOUNTER — Ambulatory Visit (HOSPITAL_COMMUNITY): Payer: Self-pay | Admitting: Psychiatry

## 2018-03-30 ENCOUNTER — Other Ambulatory Visit (HOSPITAL_COMMUNITY): Payer: Self-pay

## 2018-03-30 ENCOUNTER — Ambulatory Visit (INDEPENDENT_AMBULATORY_CARE_PROVIDER_SITE_OTHER): Payer: Medicaid Other

## 2018-03-30 DIAGNOSIS — F200 Paranoid schizophrenia: Secondary | ICD-10-CM | POA: Diagnosis not present

## 2018-03-30 MED ORDER — PALIPERIDONE PALMITATE ER 156 MG/ML IM SUSY
156.0000 mg | PREFILLED_SYRINGE | INTRAMUSCULAR | Status: DC
  Administered 2018-03-30 – 2018-10-28 (×8): 156 mg via INTRAMUSCULAR

## 2018-03-30 MED ORDER — PALIPERIDONE PALMITATE ER 156 MG/ML IM SUSY
156.0000 mg | PREFILLED_SYRINGE | Freq: Once | INTRAMUSCULAR | 2 refills | Status: DC
Start: 2018-03-30 — End: 2018-05-04

## 2018-03-30 NOTE — Progress Notes (Signed)
Patient presented today with appropriate affect and good mood for her monthly injection of Invega Sustenna 156 mg. Patients mom reports patient is doing much better. Patient today was engaged, smiling and participating in the conversation in a positive way. Hinda GlatterInvega Sustenna injection prepared as ordered and administered in the patients leftdeltoid (per pt. Request) Patient tolerated well and without complaint. Patient will call with any questions

## 2018-04-02 ENCOUNTER — Ambulatory Visit (HOSPITAL_COMMUNITY): Payer: Self-pay | Admitting: Licensed Clinical Social Worker

## 2018-04-08 ENCOUNTER — Encounter (HOSPITAL_COMMUNITY): Payer: Self-pay | Admitting: Licensed Clinical Social Worker

## 2018-04-08 ENCOUNTER — Ambulatory Visit (INDEPENDENT_AMBULATORY_CARE_PROVIDER_SITE_OTHER): Payer: Medicaid Other | Admitting: Licensed Clinical Social Worker

## 2018-04-08 DIAGNOSIS — F200 Paranoid schizophrenia: Secondary | ICD-10-CM

## 2018-04-08 NOTE — Progress Notes (Signed)
   THERAPIST PROGRESS NOTE  Session Time: 3-4  Participation Level: Active  Behavioral Response: CasualConfusedEuthymic  Type of Therapy: Individual Therapy  Treatment Goals addressed: Diagnosis: Schizophrenia  Interventions: CBT, Strength-based and Assertiveness Training  Summary: Lorraine ClinesJaleesia Bryant is a 32 y.o. female who presents with Schizophrenia.  Subjective: "I feel like I need to change up my routine of constantly waiting and worrying but I'm nervous that God won't approve of my actions."  Pt has upbeat mood, blunted affect, a slight tremble in her voice, and stares at counselor throughout session. She states she is wondering whether she should pursue a relationship w/ another boy who is not her soulmate that she is "waiting for in spirit". Spent time discussing meaning making, challenging anxious thoughts, and discovering how to live in the moment.   Suicidal/Homicidal: Nowithout intent/plan  Therapist Response: Pt appears to continue to struggle w/ significant distorted thinking, believing she should wait for a spouse that her mother has told her is already married. Pt continues to report that "demonic forces" are trying to keep her from meeting her true love. Counselor worked to help pt's rigid thinking, support her emotional disturbance, and validate her experience of confusion.  Plan: Return again in 2 weeks. Continue to learn and practice coping skills for anxiety and Schizophrenic thinking patterns.  Diagnosis:    ICD-10-CM   1. Schizophrenia, paranoid (HCC) F20.0        Margo CommonWesley E Swan, LCAS-A 04/08/2018

## 2018-04-09 ENCOUNTER — Ambulatory Visit (HOSPITAL_COMMUNITY): Payer: Medicaid Other | Admitting: Psychiatry

## 2018-04-11 ENCOUNTER — Ambulatory Visit (HOSPITAL_COMMUNITY): Payer: Medicaid Other | Admitting: Psychiatry
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
## 2018-04-17 ENCOUNTER — Ambulatory Visit: Payer: Self-pay | Admitting: Obstetrics and Gynecology

## 2018-04-22 ENCOUNTER — Ambulatory Visit (INDEPENDENT_AMBULATORY_CARE_PROVIDER_SITE_OTHER): Payer: Medicaid Other | Admitting: Licensed Clinical Social Worker

## 2018-04-22 DIAGNOSIS — F200 Paranoid schizophrenia: Secondary | ICD-10-CM

## 2018-04-23 ENCOUNTER — Encounter (HOSPITAL_COMMUNITY): Payer: Self-pay | Admitting: Licensed Clinical Social Worker

## 2018-04-23 NOTE — Progress Notes (Signed)
   THERAPIST PROGRESS NOTE  Session Time: 3-4  Participation Level: Active  Behavioral Response: Casual and NeatAlertEuthymic  Type of Therapy: Individual Therapy  Treatment Goals addressed: Coping  Interventions: CBT  Summary: Lorraine Bryant is a 32 y.o. female who presents with Schizophrenia.   Subjective: "I'm thinking of registering for 1 class starting in October. I found a job position in my field and I need to apply for it."  Pt was active, engaged, w/ upbeat mood, and congruent affect. She made normal eye contact. She reported on good news in her life and recent decisions to re enroll in school and look for employment in [place]. 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 She appears comfortable and states she is managing her delusional thoughts and learning to replace these thoughts w/ "more healthy thoughts". Pt admits she needs to start back into things slowly since she is vulnerable to have another schizophrenic episode.   Suicidal/Homicidal: Nowithout intent/plan  Therapist Response: Counselor used open questions, affirming encouragement, reflection, and CBT based socratic questioning to help pt identify next steps to apply for jobs. Pt continues to have varied presentation from month to month. Pt admits she continues to have distressing thoughts but is better able to manage them though she appears to continue to believe her thoughts of personalization and god-like status though it does not appear to be causing her dysfunction in her relationships at this time.   Plan: Return again in 2 weeks.  Diagnosis:    ICD-10-CM   1. Schizophrenia, paranoid (HCC) F20.0       [name], LCAS-A 04/23/2018

## 2018-04-27 ENCOUNTER — Ambulatory Visit: Payer: Self-pay | Admitting: Obstetrics & Gynecology

## 2018-04-30 ENCOUNTER — Ambulatory Visit (INDEPENDENT_AMBULATORY_CARE_PROVIDER_SITE_OTHER): Payer: Medicaid Other

## 2018-04-30 DIAGNOSIS — F200 Paranoid schizophrenia: Secondary | ICD-10-CM | POA: Diagnosis not present

## 2018-04-30 NOTE — Progress Notes (Signed)
Patient presented today with appropriate affect and energetic mood for her monthly injection of Invega Sustenna 156 mg. Patient expressed interest in becoming a PEER and I gave her some info on [employer] [place]. Patient today was engaged, smiling and participating in the conversation in a positive way.Hinda GlatterInvega Sustenna injection prepared as ordered and administered in the patients right deltoid (per pt. Request) Patient tolerated well and without complaint. Patient will call with any questions

## 2018-05-04 ENCOUNTER — Encounter (HOSPITAL_COMMUNITY): Payer: Self-pay | Admitting: Psychiatry

## 2018-05-04 ENCOUNTER — Ambulatory Visit (INDEPENDENT_AMBULATORY_CARE_PROVIDER_SITE_OTHER): Payer: Medicaid Other | Admitting: Psychiatry

## 2018-05-04 DIAGNOSIS — F411 Generalized anxiety disorder: Secondary | ICD-10-CM | POA: Diagnosis not present

## 2018-05-04 DIAGNOSIS — F200 Paranoid schizophrenia: Secondary | ICD-10-CM | POA: Diagnosis not present

## 2018-05-04 DIAGNOSIS — Z79899 Other long term (current) drug therapy: Secondary | ICD-10-CM

## 2018-05-04 DIAGNOSIS — Z814 Family history of other substance abuse and dependence: Secondary | ICD-10-CM | POA: Diagnosis not present

## 2018-05-04 DIAGNOSIS — Z818 Family history of other mental and behavioral disorders: Secondary | ICD-10-CM

## 2018-05-04 MED ORDER — PALIPERIDONE PALMITATE ER 156 MG/ML IM SUSY: 156.0000 mg | PREFILLED_SYRINGE | Freq: Once | INTRAMUSCULAR | 2 refills | Status: DC

## 2018-05-04 NOTE — Progress Notes (Signed)
BH MD/PA/NP OP Progress Note  05/04/2018 4:19 PM Lorraine Bryant  MRN:  [id number]  Chief Complaint: I am doing better.  I am taking Seroquel and InVega.  HPI: Patient came for her follow-up appointment with her mother.  She was 10 minutes late.  She saw Dr. Suann Larryesker on her last visit while I was out.  He changed her medication and now she is taking Seroquel 100 mg at bedtime.  He also reduce Invega action 256 mg every month.  She is no longer taking Vistaril and 3 mg InVega tablet.  Overall patient feeling better.  She feels more energy and she is sleeping better.  She still feel that her medicine does not last long but she is happy with the progress.  I reviewed her blood work results.  Her prolactin level is 137.  She denies any breast discharge and she has no more appointment with endocrinologist.  She is hoping to start school.  She also planning to start going to gym soon.  She denies any paranoia, hallucination, irritability or any mood swings.  She denies any suicidal thoughts or homicidal thought.  Visit Diagnosis:    ICD-10-CM   1. Paranoid schizophrenia (HCC) F20.0   2. GAD (generalized anxiety disorder) F41.1     Past Psychiatric History: Reviewed. Patient has multiple psychiatric hospitalization.She was admitted at [facility], [place].Patient was prescribed trazodone, Vistaril, Cogentin and Prolixin injection at behavioral health center. In the past she had tried Haldol, Abilify, olanzapine, Tegretol, Invega Sustenainjection and Latuda. She remembered Abilify make her very restless, Latuda cause nauseous and olanzapine causes increased weight gain. She was seeing Dr. Cletis MediaAkintyobut not happy with the services because she started to have medication side effects and he did not do blood work or change the medication. Usually she was given Seroquel to help her sleep.  Her Vistaril was discontinued.  Patient has history of suicidal ideation,  psychosis, paranoia and delusions.  Past Medical History:  Past Medical History:  Diagnosis Date  . Depression   . Hallucinations   . Psychosis (HCC)    per pt and family last year    Past Surgical History:  Procedure Laterality Date  . NO PAST SURGERIES      Family Psychiatric History: Reviewed.  Family History:  Family History  Problem Relation Age of Onset  . Drug abuse Father   . Schizophrenia Maternal Aunt     Social History:  Social History   Socioeconomic History  . Marital status: Single    Spouse name: Not on file  . Number of children: Not on file  . Years of education: Not on file  . Highest education level: Not on file  Occupational History  . Not on file  Social Needs  . Financial resource strain: Not on file  . Food insecurity:    Worry: Not on file    Inability: Not on file  . Transportation needs:    Medical: Not on file    Non-medical: Not on file  Tobacco Use  . Smoking status: Never Smoker  . Smokeless tobacco: Never Used  Substance and Sexual Activity  . Alcohol use: No  . Drug use: No    Comment: unable to assess d/t pt not responding to questioning  . Sexual activity: Never  Lifestyle  . Physical activity:    Days per week: Not on file    Minutes per session: Not on file  . Stress: Not on file  Relationships  .  Social connections:    Talks on phone: Not on file    Gets together: Not on file    Attends religious service: Not on file    Active member of club or organization: Not on file    Attends meetings of clubs or organizations: Not on file    Relationship status: Not on file  Other Topics Concern  . Not on file  Social History Narrative  . Not on file    Allergies:  Allergies  Allergen Reactions  . Abilify [Aripiprazole] Other (See Comments)    Slow moving and hand cramping up    Metabolic Disorder Labs: Lab Results  Component Value Date   HGBA1C 5.8 (H) 02/23/2018   MPG 120 02/23/2018   Lab Results   Component Value Date   PROLACTIN 137.1 (H) 02/23/2018   PROLACTIN 84.3 (H) 09/01/2017   No results found for: CHOL, TRIG, HDL, CHOLHDL, VLDL, LDLCALC Lab Results  Component Value Date   TSH 0.96 02/23/2018   TSH 0.543 11/27/2011    Therapeutic Level Labs: No results found for: LITHIUM No results found for: VALPROATE No components found for:  CBMZ  Current Medications: Current Outpatient Medications  Medication Sig Dispense Refill  . norelgestromin-ethinyl estradiol (ORTHO EVRA) 150-35 MCG/24HR transdermal patch Place 1 patch onto the skin once a week. 3 patch 3  . paliperidone (INVEGA SUSTENNA) 156 MG/ML SUSY injection Inject 1 mL (156 mg total) into the muscle once for 1 dose. 1 mL 2  . QUEtiapine (SEROQUEL) 100 MG tablet Take every night and okay to take another tablet or 1/2 tablet during the day if needed 60 tablet 2   Current Facility-Administered Medications  Medication Dose Route Frequency Provider Last Rate Last Dose  . paliperidone (INVEGA SUSTENNA) injection 156 mg  156 mg Intramuscular Q30 days Burnard Leigh, MD   156 mg at 04/30/18 1513     Musculoskeletal: Strength & Muscle Tone: within normal limits Gait & Station: normal Patient leans: N/A  Psychiatric Specialty Exam: ROS  There were no vitals taken for this visit.There is no height or weight on file to calculate BMI.  General Appearance: Casual  Eye Contact:  Fair  Speech:  Clear and Coherent  Volume:  Normal  Mood:  Anxious  Affect:  Congruent  Thought Process:  Goal Directed  Orientation:  Full (Time, Place, and Person)  Thought Content: WDL   Suicidal Thoughts:  No  Homicidal Thoughts:  No  Memory:  Immediate;   Fair Recent;   Fair Remote;   Fair  Judgement:  Fair  Insight:  Fair  Psychomotor Activity:  Decreased  Concentration:  Concentration: Fair and Attention Span: Fair  Recall:  [employer] of Knowledge: Fair  Language: Fair  Akathisia:  No  Handed:  Right  AIMS (if  indicated): not done  Assets:  Communication Skills Desire for Improvement Housing Social Support  ADL's:  Intact  Cognition: WNL  Sleep:  Good   Screenings: AIMS     [facility]  AIMS Total Score  0    AUDIT     [facility]  Alcohol Use Disorder Identification Test Final Score (AUDIT)  0    GAD-7     [facility]  Total GAD-7 Score  0    PHQ2-9     [facility]  PHQ-2 Total Score  0  PHQ-9 Total Score  0       Assessment and Plan: Schizophrenia chronic paranoid type.  Generalized anxiety disorder.    Patient is doing better since addition of low-dose Seroquel.  Her sleep is improved from the past.  We discussed weight gain from the 2 antipsychotic medication but patient appears to be better.  I also reviewed prolactin level but patient does not have any discharge.  I will continue to monitor closely her weight gain.  Reinforced to come on time on her next appointment.  Continue Seroquel 100 mg and Invega in 156 mg to muscular every month.  Recommended to call us back if she has any question or any concern.  Follow-up in 3 months.   Cleotis Nipper, MD 05/04/2018, 4:19 PM

## 2018-05-06 ENCOUNTER — Ambulatory Visit (INDEPENDENT_AMBULATORY_CARE_PROVIDER_SITE_OTHER): Payer: Medicaid Other | Admitting: Licensed Clinical Social Worker

## 2018-05-06 DIAGNOSIS — F200 Paranoid schizophrenia: Secondary | ICD-10-CM | POA: Diagnosis not present

## 2018-05-08 ENCOUNTER — Encounter (HOSPITAL_COMMUNITY): Payer: Self-pay | Admitting: Licensed Clinical Social Worker

## 2018-05-08 ENCOUNTER — Ambulatory Visit (HOSPITAL_COMMUNITY): Payer: Self-pay | Admitting: Psychiatry

## 2018-05-08 NOTE — Progress Notes (Signed)
   THERAPIST PROGRESS NOTE  Session Time: 3-4  Participation Level: Active  Behavioral Response: Neat and Well GroomedAlertEuthymic  Type of Therapy: Individual Therapy  Treatment Goals addressed: Diagnosis: Schizophrenia; Sx management and coping skills  Interventions: CBT and DBT  Summary: Lorraine Bryant is a 32 y.o. female who presents with Schizophrenia.  "I think I want to call my exboyfriend bc I follow a fortune teller who told me a Scorpio would soon finance an endeavor for me, and my ex boyfriend is the only Scorpio I know."   Pt is active, engaged, calm, smiles often, and makes normal eye contact. She denies any hallucinations or delusions since last visit. She continues to be med compliant and enjoy the LAI for schizophrenia sxs. She wants to discuss her relationship w/ her ex boyfriend who she plans to contact. Counselor led pt in a pros/cons list making to resolve ambivalence. Pt reports she is enrolled in school and will begin online classes again in October.   Suicidal/Homicidal: Nowithout intent/plan  Therapist Response: Counselor used supportive encouragement. Counselor checked pt's sxs and ADL's which appear WNL. Counselor worked on helping pt build insight into her decision making through pros/cons analysis.   Plan: Return again in 4 weeks.  Diagnosis:    ICD-10-CM   1. Paranoid schizophrenia (HCC) F20.0        Margo CommonWesley E Colbie Sliker, LCAS-A 05/08/2018

## 2018-05-27 ENCOUNTER — Ambulatory Visit (HOSPITAL_COMMUNITY): Payer: Self-pay | Admitting: Licensed Clinical Social Worker

## 2018-05-29 ENCOUNTER — Ambulatory Visit (INDEPENDENT_AMBULATORY_CARE_PROVIDER_SITE_OTHER): Payer: Medicaid Other

## 2018-05-29 DIAGNOSIS — F200 Paranoid schizophrenia: Secondary | ICD-10-CM

## 2018-05-29 NOTE — Progress Notes (Signed)
Patient presented today with appropriate affect and energetic mood for her monthly injection of Invega Sustenna156mg . Patient today was engaged, smiling and participating in the conversation in a positive way.[place] Sustenna injection prepared as ordered and administered in the patientsleft deltoid (per pt. Request) Patient tolerated well and without complaint. Patient will call with any questions

## 2018-06-11 ENCOUNTER — Ambulatory Visit (HOSPITAL_COMMUNITY): Payer: Self-pay | Admitting: Licensed Clinical Social Worker

## 2018-06-12 ENCOUNTER — Encounter (HOSPITAL_COMMUNITY): Payer: Self-pay | Admitting: Psychiatry

## 2018-06-12 ENCOUNTER — Encounter (HOSPITAL_COMMUNITY): Payer: Self-pay

## 2018-06-12 ENCOUNTER — Ambulatory Visit (INDEPENDENT_AMBULATORY_CARE_PROVIDER_SITE_OTHER): Payer: Medicaid Other | Admitting: Psychiatry

## 2018-06-12 DIAGNOSIS — F411 Generalized anxiety disorder: Secondary | ICD-10-CM

## 2018-06-12 DIAGNOSIS — F200 Paranoid schizophrenia: Secondary | ICD-10-CM | POA: Diagnosis not present

## 2018-06-12 MED ORDER — QUETIAPINE FUMARATE 100 MG PO TABS
ORAL_TABLET | ORAL | 2 refills | Status: DC
Start: 2018-06-12 — End: 2018-08-03

## 2018-06-12 MED ORDER — PALIPERIDONE PALMITATE ER 156 MG/ML IM SUSY: 156.0000 mg | PREFILLED_SYRINGE | Freq: Once | INTRAMUSCULAR | 2 refills | Status: DC

## 2018-06-12 NOTE — Progress Notes (Signed)
BH MD/PA/NP OP Progress Note  06/12/2018 11:08 AM [place]  MRN:  [id number]  Chief Complaint: I am doing better.  HPI: Patient came for her follow-up appointment with her mother.  She is doing much better with the combination of in Vega injection and Seroquel.  She has no longer hearing voices and paranoia.  She is not taking any other medication.  Her sleep is good.  She denies any hallucinations.  Her energy level is still same and she had gained 2 pounds since the last visit.  However she is trying to watch her calorie intake.  She started eating vegetables and drinking water instead of soda.  We talked about side effects of 2 antipsychotics in detail.  Patient is also hoping to start school.  She wants to continue her current medication.  Patient denies any irritability, anger, mood swing.  Her sleep is good.  She denies drinking or using any illegal substances.  Visit Diagnosis:    ICD-10-CM   1. Paranoid schizophrenia (HCC) F20.0 QUEtiapine (SEROQUEL) 100 MG tablet    paliperidone (INVEGA SUSTENNA) 156 MG/ML SUSY injection  2. GAD (generalized anxiety disorder) F41.1 QUEtiapine (SEROQUEL) 100 MG tablet    paliperidone (INVEGA SUSTENNA) 156 MG/ML SUSY injection    Past Psychiatric History: Reviewed Patient has multiple psychiatric hospitalization.She was admitted at [facility], [place], old [employer] and behavioral center.Patient prescribed trazodone, Vistaril, Cogentin and Prolixin injection at behavioral health center. In the past she had tried Haldol, Abilify, olanzapine, Tegretol, Invega Sustenainjection and Latuda. Abilify make her very restless, Latuda cause nauseous and olanzapine causes increased weight gain. She was seeing Dr. Cletis Media not happy with the services because she started to have medication side effects and he did not do blood work or change the medication.   Past Medical History:  Past Medical History:  Diagnosis Date  . Depression   .  Hallucinations   . Psychosis (HCC)    per pt and family last year    Past Surgical History:  Procedure Laterality Date  . NO PAST SURGERIES      Family Psychiatric History: Viewed  Family History:  Family History  Problem Relation Age of Onset  . Drug abuse Father   . Schizophrenia Maternal Aunt     Social History:  Social History   Socioeconomic History  . Marital status: Single    Spouse name: Not on file  . Number of children: Not on file  . Years of education: Not on file  . Highest education level: Not on file  Occupational History  . Not on file  Social Needs  . Financial resource strain: Not on file  . Food insecurity:    Worry: Not on file    Inability: Not on file  . Transportation needs:    Medical: Not on file    Non-medical: Not on file  Tobacco Use  . Smoking status: Never Smoker  . Smokeless tobacco: Never Used  Substance and Sexual Activity  . Alcohol use: No  . Drug use: No    Comment: unable to assess d/t pt not responding to questioning  . Sexual activity: Never  Lifestyle  . Physical activity:    Days per week: Not on file    Minutes per session: Not on file  . Stress: Not on file  Relationships  . Social connections:    Talks on phone: Not on file    Gets together: Not on file    Attends religious service: Not on file  Active member of club or organization: Not on file    Attends meetings of clubs or organizations: Not on file    Relationship status: Not on file  Other Topics Concern  . Not on file  Social History Narrative  . Not on file    Allergies:  Allergies  Allergen Reactions  . Abilify [Aripiprazole] Other (See Comments)    Slow moving and hand cramping up    Metabolic Disorder Labs: Lab Results  Component Value Date   HGBA1C 5.8 (H) 02/23/2018   MPG 120 02/23/2018   Lab Results  Component Value Date   PROLACTIN 137.1 (H) 02/23/2018   PROLACTIN 84.3 (H) 09/01/2017   No results found for: CHOL, TRIG, HDL,  CHOLHDL, VLDL, LDLCALC Lab Results  Component Value Date   TSH 0.96 02/23/2018   TSH 0.543 11/27/2011    Therapeutic Level Labs: No results found for: LITHIUM No results found for: VALPROATE No components found for:  CBMZ  Current Medications: Current Outpatient Medications  Medication Sig Dispense Refill  . paliperidone (INVEGA SUSTENNA) 156 MG/ML SUSY injection Inject 1 mL (156 mg total) into the muscle once for 1 dose. 1 mL 2  . QUEtiapine (SEROQUEL) 100 MG tablet Take every night and okay to take another tablet or 1/2 tablet during the day if needed 60 tablet 2  . norelgestromin-ethinyl estradiol (ORTHO EVRA) 150-35 MCG/24HR transdermal patch Place 1 patch onto the skin once a week. (Patient not taking: Reported on 06/12/2018) 3 patch 3   Current Facility-Administered Medications  Medication Dose Route Frequency Provider Last Rate Last Dose  . paliperidone (INVEGA SUSTENNA) injection 156 mg  156 mg Intramuscular Q30 days Burnard Leigh, MD   156 mg at 05/29/18 1248     Musculoskeletal: Strength & Muscle Tone: within normal limits Gait & Station: normal Patient leans: N/A  Psychiatric Specialty Exam: ROS  Blood pressure 122/70, pulse 72, height 5' 3.5" (1.613 m), weight 250 lb (113.4 kg).Body mass index is 43.59 kg/m.  General Appearance: Casual  Eye Contact:  Good  Speech:  Clear and Coherent  Volume:  Normal  Mood:  Pleasant  Affect:  Appropriate  Thought Process:  Goal Directed  Orientation:  Full (Time, Place, and Person)  Thought Content: Logical   Suicidal Thoughts:  No  Homicidal Thoughts:  No  Memory:  Immediate;   Good Recent;   Good Remote;   Good  Judgement:  Good  Insight:  Good  Psychomotor Activity:  Normal  Concentration:  Concentration: Good and Attention Span: Good  Recall:  Good  Fund of Knowledge: Good  Language: Good  Akathisia:  No  Handed:  Right  AIMS (if indicated): not done  Assets:  Communication Skills Desire for  Improvement Housing Social Support  ADL's:  Intact  Cognition: WNL  Sleep:  Good   Screenings: AIMS     [facility]  AIMS Total Score  0    AUDIT     [facility]  Alcohol Use Disorder Identification Test Final Score (AUDIT)  0    GAD-7     Office Visit from 07/02/2017 in [facility]  Total GAD-7 Score  0    PHQ2-9     Office Visit from 07/02/2017 in [facility]  PHQ-2 Total Score  0  PHQ-9 Total Score  0       Assessment and Plan:  Schizophrenia chronic paranoid type.  Generalized anxiety disorder.  Patient doing better since low-dose Seroquel started.  We talked about taking 2 antipsychotic medication and the side effects.  However patient overall doing much better.  We talked about weight gain and she promised that she will start gym and monitor her weight gain closely she will also trying to lose weight by switching to eating vegetables and drinking water.  She will need a letter so she can start school.  We will provide a letter today.  I recommended to call us back if she has any question or any concern.  I will see her again in 3 months.  We will consider prolactin level on her next appointment.   Cleotis Nipper, MD 06/12/2018, 11:08 AM

## 2018-06-16 ENCOUNTER — Encounter (HOSPITAL_COMMUNITY): Payer: Self-pay

## 2018-06-25 ENCOUNTER — Ambulatory Visit (INDEPENDENT_AMBULATORY_CARE_PROVIDER_SITE_OTHER): Payer: Medicaid Other | Admitting: Licensed Clinical Social Worker

## 2018-06-25 DIAGNOSIS — F200 Paranoid schizophrenia: Secondary | ICD-10-CM | POA: Diagnosis not present

## 2018-06-29 ENCOUNTER — Ambulatory Visit (INDEPENDENT_AMBULATORY_CARE_PROVIDER_SITE_OTHER): Payer: Medicaid Other

## 2018-06-29 DIAGNOSIS — F200 Paranoid schizophrenia: Secondary | ICD-10-CM | POA: Diagnosis not present

## 2018-06-29 NOTE — Progress Notes (Signed)
Patient presented today with appropriate affect andenergeticmood for her monthly injection of Invega Sustenna156mg .Patient today was engaged, smiling and participating in the conversation in a positive way.Hinda Glatter Sustenna injection prepared as ordered and administered in the patientsright deltoid (per pt. Request) Patient tolerated well and without complaint. Patient will call with any questions

## 2018-07-01 ENCOUNTER — Encounter (HOSPITAL_COMMUNITY): Payer: Self-pay | Admitting: Licensed Clinical Social Worker

## 2018-07-01 ENCOUNTER — Telehealth (HOSPITAL_COMMUNITY): Payer: Self-pay

## 2018-07-01 NOTE — Progress Notes (Signed)
   THERAPIST PROGRESS NOTE  Session Time: 11-12  Participation Level: Active  Behavioral Response: CasualAlertEuthymic  Type of Therapy: Individual Therapy  Treatment Goals addressed: Diagnosis: Schizophrenia  Interventions: DBT and Assertiveness Training  Summary: Lorraine Bryant is a 32 y.o. female who presents with Schizophrenia, managed w/ Richelle ItoInveaga Sustena.   Pt is active, engaged, talkative, coherent, makes good eye contact, and is lucid during session. She reports on her decision to withdrawal from school due to financing problems. She will focus more on serving in her church. She denies hallucinations and/or delusions since last visit. Pt discussed her coping skills and her daily routines to stay balanced.  Suicidal/Homicidal: Nowithout intent/plan  Therapist Response: Counselor used open questions, active listening, and encouragement. Counselor asked pt to share about her utilized coping skills and discuss the ways that she is changing her bxs. Pt is compliant on medications and appears stable. Her cognitions and emotional lability are WNL.   Plan: Return again in 4 weeks.  Diagnosis:    ICD-10-CM   1. Paranoid schizophrenia (HCC) F20.0        L[place]raine Bryant, LCAS-A 07/01/2018

## 2018-07-01 NOTE — Telephone Encounter (Signed)
Patients mother called with concerns that patient is "not right". She states that patient is not sleeping and something seems off with her. Mom does not want to take her in for an eval, she states patient is not suicidal or homicidal. You are booked up tomorrow, please review and advise, thank you

## 2018-07-02 ENCOUNTER — Telehealth (HOSPITAL_COMMUNITY): Payer: Self-pay | Admitting: Psychiatry

## 2018-07-02 NOTE — Telephone Encounter (Signed)
I returned her phone call and left a message. 

## 2018-07-02 NOTE — Telephone Encounter (Signed)
I called at her cell phone and left a message.  Her [name] was increased by Dr. Rene KocherEksir 2 months ago.  We can consider giving oral Risperdal 0.5 to 1 mg until she had appointment to see me.

## 2018-07-03 NOTE — Telephone Encounter (Signed)
I spoke to patients mother, she said that the patient is doing better right now taking the 2 Seroquel at night. She will call me if the patient starts to deteriorate and we will send in the oral Risperdone

## 2018-07-07 ENCOUNTER — Ambulatory Visit (INDEPENDENT_AMBULATORY_CARE_PROVIDER_SITE_OTHER): Payer: Self-pay | Admitting: Licensed Clinical Social Worker

## 2018-07-07 DIAGNOSIS — F200 Paranoid schizophrenia: Secondary | ICD-10-CM

## 2018-07-07 NOTE — Progress Notes (Signed)
Pt arrived 17 min late to session and was informed that she would need to reschedule due to her frequent late arrivals and lack of concern for being on-time.

## 2018-07-08 ENCOUNTER — Ambulatory Visit (INDEPENDENT_AMBULATORY_CARE_PROVIDER_SITE_OTHER): Payer: Medicaid Other | Admitting: Licensed Clinical Social Worker

## 2018-07-08 DIAGNOSIS — F200 Paranoid schizophrenia: Secondary | ICD-10-CM

## 2018-07-10 ENCOUNTER — Encounter (HOSPITAL_COMMUNITY): Payer: Self-pay | Admitting: Licensed Clinical Social Worker

## 2018-07-10 NOTE — Progress Notes (Signed)
   THERAPIST PROGRESS NOTE  Session Time: 2-3  Participation Level: Active  Behavioral Response: Casual and NeatAlertEuthymic  Type of Therapy: Individual Therapy  Treatment Goals addressed: Coping  Interventions: CBT  Summary: Lorraine ClinesJaleesia Bryant is a 32 y.o. female who presents with Paranoid Schizophrenia.  She is active, engaged, lucide, and makes appropriate eye contact in session.  She reports she is maintaining healthy communication w/ her mother. She is engaged in her church and is doing a daily spiritual meditation. She consistently appears for her monthly injection of [place] Sustena. She discusses strategies that are helping her maintain clear-headedness and honest communication.   Suicidal/Homicidal: Nowithout intent/plan  Therapist Response: Pt continues to report mild anxiety about seeing her father. Pt was provided w/ coping skills and encouragement to maintain mental balance while managing Schizophrenia.   Plan: Return again in 4 weeks.  Diagnosis:    ICD-10-CM   1. Paranoid schizophrenia (HCC) F20.0        Margo CommonWesley E Swan, LCAS-A 07/10/2018

## 2018-07-21 ENCOUNTER — Ambulatory Visit (INDEPENDENT_AMBULATORY_CARE_PROVIDER_SITE_OTHER): Payer: Medicaid Other | Admitting: Licensed Clinical Social Worker

## 2018-07-21 DIAGNOSIS — F200 Paranoid schizophrenia: Secondary | ICD-10-CM | POA: Diagnosis not present

## 2018-07-22 ENCOUNTER — Encounter (HOSPITAL_COMMUNITY): Payer: Self-pay | Admitting: Licensed Clinical Social Worker

## 2018-07-22 NOTE — Progress Notes (Signed)
   THERAPIST PROGRESS NOTE  Session Time: 2-3  Participation Level: Active  Behavioral Response: Well GroomedAlertEuthymic  Type of Therapy: Family Therapy  Treatment Goals addressed: Communication: Interpersonal Family Communication  Interventions: CBT and Family Systems  Summary: Lorraine Bryant is a 32 y.o. female who presents with Schizophrenia.   She presents w/ her mother in session to discuss ongoing issues w/ anger regulation and communication deficits. She states she had a difficult Thanksgiving holiday and got into multiple verbal fights w/ her sister. Counselor and pt spent time discussing healthy assertiveness and effective communication. Counselor emphasized pt's need to communicate her feelings w/o acting on her feelings reactively. Pt continues to report she hears voices and is wondering if she needs more medication.    Suicidal/Homicidal: Nowithout intent/plan  Therapist Response: Counselor used active listening, reframing, and challenging statements. Counselor encouraged pt to talk w/ pt's mother as "an Nurse, children'sally", someone to be honest w/ about how she is feeling and any thoughts she has that are troubling her. Pt was reminded that just because she feels something does not mean she must act on it.   Plan: Return again in 4 weeks.  Diagnosis:    ICD-10-CM   1. Paranoid schizophrenia (HCC) F20.0       Margo CommonWesley E Swan, LCAS-A 07/22/2018

## 2018-07-29 ENCOUNTER — Ambulatory Visit (INDEPENDENT_AMBULATORY_CARE_PROVIDER_SITE_OTHER): Payer: Medicaid Other

## 2018-07-29 ENCOUNTER — Encounter (HOSPITAL_COMMUNITY): Payer: Self-pay

## 2018-07-29 DIAGNOSIS — F200 Paranoid schizophrenia: Secondary | ICD-10-CM | POA: Diagnosis not present

## 2018-07-29 NOTE — Progress Notes (Signed)
Met with patient in with her Mother for her due [place] Boys 156 mg, IM injection every month.  Patient denied any current suicidal or homicidal ideations and no present auditory or visual hallucinations but both patient and her Mother reported at times she has to take Seroquel 50-100 mg PRN as was approved by Dr. Adele Schilder for increased auditory hallucinations.  Patient to see Dr. Adele Schilder scheduled on 08/03/18 and both patient and her Mother reported plan to keep appointment.  Patient's due injection prepared and given to patient in her left deltoid area as she tolerated without complaint of pain or discomfort.  Patient agreed to return on 08/03/18 to see Dr. Adele Schilder and in the coming month for next due [place] injection.

## 2018-08-03 ENCOUNTER — Encounter (HOSPITAL_COMMUNITY): Payer: Self-pay | Admitting: Psychiatry

## 2018-08-03 ENCOUNTER — Ambulatory Visit (INDEPENDENT_AMBULATORY_CARE_PROVIDER_SITE_OTHER): Payer: Medicaid Other | Admitting: Psychiatry

## 2018-08-03 DIAGNOSIS — F411 Generalized anxiety disorder: Secondary | ICD-10-CM | POA: Diagnosis not present

## 2018-08-03 DIAGNOSIS — F200 Paranoid schizophrenia: Secondary | ICD-10-CM | POA: Diagnosis not present

## 2018-08-03 MED ORDER — PALIPERIDONE PALMITATE ER 156 MG/ML IM SUSY: 156.0000 mg | PREFILLED_SYRINGE | Freq: Once | INTRAMUSCULAR | 2 refills | Status: DC

## 2018-08-03 MED ORDER — QUETIAPINE FUMARATE 100 MG PO TABS: ORAL_TABLET | ORAL | 2 refills | Status: DC

## 2018-08-03 NOTE — Progress Notes (Signed)
BH MD/PA/NP OP Progress Note  08/03/2018 3:13 PM Lorraine Bryant  MRN:  [id number]  Chief Complaint: I had a panic attack and nervousness when I was in [place] visiting my sister.  HPI: Patient came for her follow-up appointment with her mother.  She reported very anxious, having hallucination and panic attacks when she was visiting family members [place].  She felt that her sister was causing more anxiety and bullying her.  However once she returned back to [place] she is doing fine.  She is only taking Seroquel at bedtime.  She has not started gym but hoping to start in January in few weeks.  Her mother also endorsed much improvement in her mood, irritability, anger.  She is getting in [place] injection every 4 weeks.  She has no tremors, shakes or any EPS.  She do not recall having any breast discharge which happened when she was taking the higher dose of injection.  She is hoping to start school.  She is taking 2 antipsychotic medication and we have discussed the side effects in detail in previous session.  Patient insists that Seroquel helps her anxiety and she wants to continue at bedtime.  Visit Diagnosis:    ICD-10-CM   1. Paranoid schizophrenia (HCC) F20.0 QUEtiapine (SEROQUEL) 100 MG tablet    paliperidone (INVEGA SUSTENNA) 156 MG/ML SUSY injection  2. GAD (generalized anxiety disorder) F41.1 QUEtiapine (SEROQUEL) 100 MG tablet    paliperidone (INVEGA SUSTENNA) 156 MG/ML SUSY injection    Past Psychiatric History: Reviewed. History of multiple psychiatric hospitalization including at [facility] [place] [facility] behavioral health center.  Tried trazodone, Vistaril, Cogentin, Prolixin injection, Haldol, Abilify, olanzapine, Tegretol, [place] and [place].  Abilify make her restless, Latuda causes nausea and olanzapine caused weight gain.  Past Medical History:  Past Medical History:  Diagnosis Date  . Depression   . Hallucinations   . Psychosis (HCC)    per  pt and family last year    Past Surgical History:  Procedure Laterality Date  . NO PAST SURGERIES      Family Psychiatric History: Reviewed.  Family History:  Family History  Problem Relation Age of Onset  . Drug abuse Father   . Schizophrenia Maternal Aunt     Social History:  Social History   Socioeconomic History  . Marital status: Single    Spouse name: Not on file  . Number of children: Not on file  . Years of education: Not on file  . Highest education level: Not on file  Occupational History  . Not on file  Social Needs  . Financial resource strain: Not on file  . Food insecurity:    Worry: Not on file    Inability: Not on file  . Transportation needs:    Medical: Not on file    Non-medical: Not on file  Tobacco Use  . Smoking status: Never Smoker  . Smokeless tobacco: Never Used  Substance and Sexual Activity  . Alcohol use: No  . Drug use: No    Comment: unable to assess d/t pt not responding to questioning  . Sexual activity: Never  Lifestyle  . Physical activity:    Days per week: Not on file    Minutes per session: Not on file  . Stress: Not on file  Relationships  . Social connections:    Talks on phone: Not on file    Gets together: Not on file    Attends religious service: Not on file  Active member of club or organization: Not on file    Attends meetings of clubs or organizations: Not on file    Relationship status: Not on file  Other Topics Concern  . Not on file  Social History Narrative  . Not on file    Allergies:  Allergies  Allergen Reactions  . Abilify [Aripiprazole] Other (See Comments)    Slow moving and hand cramping up    Metabolic Disorder Labs: Lab Results  Component Value Date   HGBA1C 5.8 (H) 02/23/2018   MPG 120 02/23/2018   Lab Results  Component Value Date   PROLACTIN 137.1 (H) 02/23/2018   PROLACTIN 84.3 (H) 09/01/2017   No results found for: CHOL, TRIG, HDL, CHOLHDL, VLDL, LDLCALC Lab Results   Component Value Date   TSH 0.96 02/23/2018   TSH 0.543 11/27/2011    Therapeutic Level Labs: No results found for: LITHIUM No results found for: VALPROATE No components found for:  CBMZ  Current Medications: Current Outpatient Medications  Medication Sig Dispense Refill  . norelgestromin-ethinyl estradiol (ORTHO EVRA) 150-35 MCG/24HR transdermal patch Place 1 patch onto the skin once a week. (Patient not taking: Reported on 06/12/2018) 3 patch 3  . paliperidone (INVEGA SUSTENNA) 156 MG/ML SUSY injection Inject 1 mL (156 mg total) into the muscle once for 1 dose. 1 mL 2  . QUEtiapine (SEROQUEL) 100 MG tablet Take every night and okay to take another tablet or 1/2 tablet during the day if needed 60 tablet 2   Current Facility-Administered Medications  Medication Dose Route Frequency Provider Last Rate Last Dose  . paliperidone (INVEGA SUSTENNA) injection 156 mg  156 mg Intramuscular Q30 days Burnard Leigh, MD   156 mg at 07/29/18 1425     Musculoskeletal: Strength & Muscle Tone: within normal limits Gait & Station: normal Patient leans: N/A  Psychiatric Specialty Exam: ROS  Blood pressure 132/74, pulse 80, height 5' 3.5" (1.613 m), weight 253 lb (114.8 kg).There is no height or weight on file to calculate BMI.  General Appearance: Casual  Eye Contact:  Good  Speech:  Clear and Coherent  Volume:  Normal  Mood:  Euthymic  Affect:  Congruent  Thought Process:  Goal Directed  Orientation:  Full (Time, Place, and Person)  Thought Content: Logical   Suicidal Thoughts:  No  Homicidal Thoughts:  No  Memory:  Immediate;   Good Recent;   Good Remote;   Good  Judgement:  Good  Insight:  Good  Psychomotor Activity:  Normal  Concentration:  Concentration: Fair and Attention Span: Fair  Recall:  Good  Fund of Knowledge: Good  Language: Good  Akathisia:  No  Handed:  Right  AIMS (if indicated): not done  Assets:  Communication Skills Desire for  Improvement Housing Resilience Social Support  ADL's:  Intact  Cognition: WNL  Sleep:  Good   Screenings: AIMS     [facility]  AIMS Total Score  0    AUDIT     Ad[facility]  Alcohol Use Disorder Identification Test Final Score (AUDIT)  0    GAD-7     [facility]  Total GAD-7 Score  0    PHQ2-9     Office Visit from 07/02/2017 in [facility]  PHQ-2 Total Score  0  PHQ-9 Total Score  0  Assessment and Plan: Schizophrenia chronic paranoid type.  Generalized anxiety disorder.  Patient doing better.  I recommended to try Seroquel half to 1 tablet at bedtime to help her sleep.  Even though patient reluctant to cut down her Seroquel because it is helping her anxiety she is willing to try half dose.  I also encouraged that she should watch her calorie intake and do regular exercise.  She is hoping to start gym in January.  Continue Invega Sustena 156 mg intramuscular every 4 weeks.  Recommended to call us back if she is any question or any concern.  I will do blood work on her next appointment.  Follow-up in 3 months.   Cleotis NipperSyed T Caylan Schifano, MD 08/03/2018, 3:13 PM

## 2018-08-04 ENCOUNTER — Encounter (HOSPITAL_COMMUNITY): Payer: Self-pay

## 2018-08-04 ENCOUNTER — Ambulatory Visit (INDEPENDENT_AMBULATORY_CARE_PROVIDER_SITE_OTHER): Payer: Medicaid Other | Admitting: Licensed Clinical Social Worker

## 2018-08-04 DIAGNOSIS — F200 Paranoid schizophrenia: Secondary | ICD-10-CM | POA: Diagnosis not present

## 2018-08-06 ENCOUNTER — Encounter (HOSPITAL_COMMUNITY): Payer: Self-pay | Admitting: Licensed Clinical Social Worker

## 2018-08-06 NOTE — Progress Notes (Signed)
   THERAPIST PROGRESS NOTE  Session Time: 3-4  Participation Level: Active  Behavioral Response: Well GroomedAlert and ConfusedEuthymic  Type of Therapy: Individual Therapy  Treatment Goals addressed: Coping  Interventions: CBT  Summary: Van ClinesJaleesia Mera is a 32 y.o. female who presents with Schizophrenia and religious-based delusions of grandeur.  She reports her mood has been stable, affect is WNL and continues to take medications as px. She is talkative in session and stares inappropriately w/ wide eyes. She discusses a dream she had in which her children were taken from her and this caused her severe stress. Pt continues to report episodic delusions but is sleeping well. Pt denies SI and HI and command voices.   Suicidal/Homicidal: Nowithout intent/plan  Therapist Response: Counselor used open questions, active listening, supportive encouragement. Counselor challenged pt's delusions and encouraged coping skills of cognitive therapy.   Plan: Return again in 4 weeks.  Diagnosis:    ICD-10-CM   1. Paranoid schizophrenia (HCC) F20.0      Margo CommonWesley E Swan, LCAS-A 08/06/2018

## 2018-08-25 ENCOUNTER — Encounter (HOSPITAL_COMMUNITY): Payer: Self-pay | Admitting: Licensed Clinical Social Worker

## 2018-08-25 ENCOUNTER — Ambulatory Visit (INDEPENDENT_AMBULATORY_CARE_PROVIDER_SITE_OTHER): Payer: Medicaid Other | Admitting: Licensed Clinical Social Worker

## 2018-08-25 DIAGNOSIS — F200 Paranoid schizophrenia: Secondary | ICD-10-CM

## 2018-08-25 NOTE — Progress Notes (Signed)
   THERAPIST PROGRESS NOTE  Session Time: 3-4  Participation Level: Active  Behavioral Response: Well GroomedAlertEuthymic  Type of Therapy: Individual Therapy  Treatment Goals addressed: Coping  Interventions: CBT  Summary: Lorraine Bryant is a 33 y.o. female who presents with Schizophrenia.   She is active, engaged, and discusses her holiday activities from last month. She celebrates herself by describing a recent church event she helped host called a Materials engineer"Praise Party". Pt denies any delusions or hallucinations, command voices, or SI, HI.  Suicidal/Homicidal: Nowithout intent/plan  Therapist Response: Counselor used validation, goal-setting, and supportive reframing to help pt challenge herself and learn more about her past and how it shapes the woman she is becoming.   Plan: Return again in 4 weeks.  Diagnosis:    ICD-10-CM   1. Paranoid schizophrenia (HCC) F20.0       Margo CommonWesley E Yaseen Gilberg, LCAS-A 08/25/2018

## 2018-08-27 IMAGING — US US PELVIS COMPLETE TRANSABD/TRANSVAG
1 series · 15 of 25 positions shown · non-contrast
Comparison: None

CLINICAL DATA: Abnormal uterine bleeding.  Spotting.

EXAM:
TRANSABDOMINAL AND TRANSVAGINAL ULTRASOUND OF PELVIS
TECHNIQUE: Both transabdominal and transvaginal ultrasound examinations of the
pelvis were performed. Transabdominal technique was performed for
global imaging of the pelvis including uterus, ovaries, adnexal
regions, and pelvic cul-de-sac. It was necessary to proceed with
endovaginal exam following the transabdominal exam to visualize the
uterus, endometrium and ovaries..

[Series 1: us pelvis complete transabd/transvag · 15 of 63 slices shown]
[im 1/63]
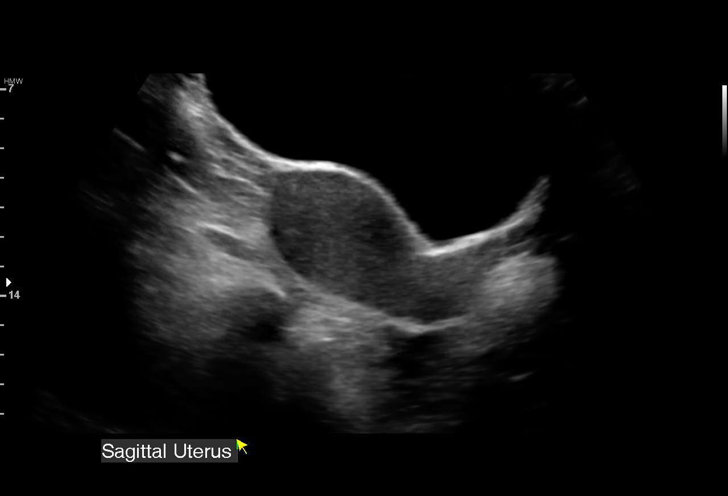
[im 6/63]
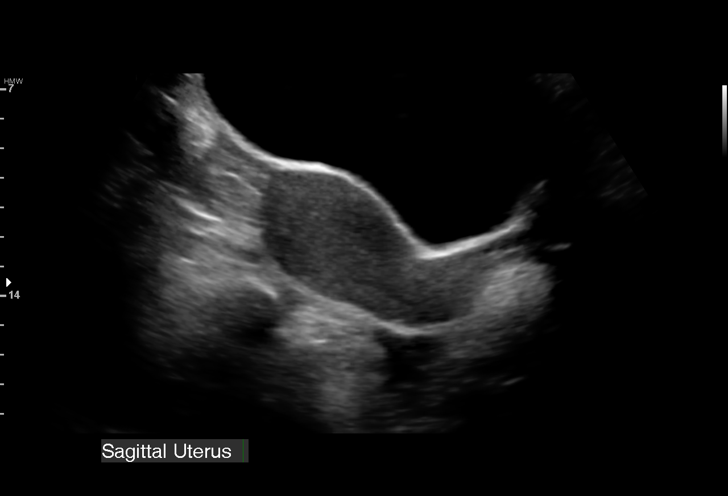
[im 11/63]
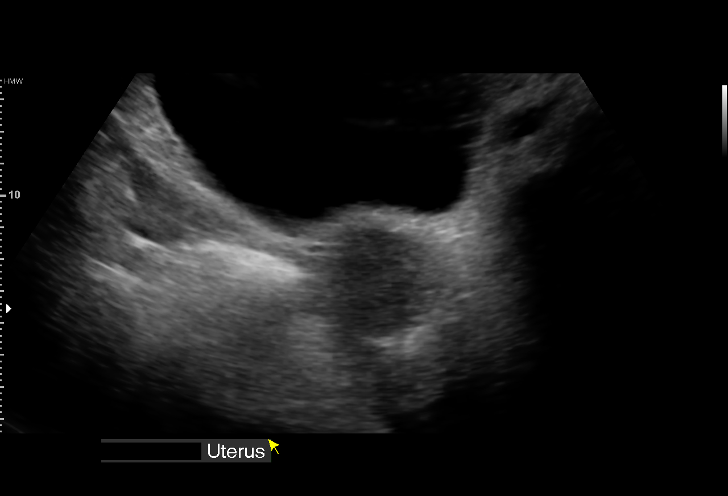
[im 13/63]
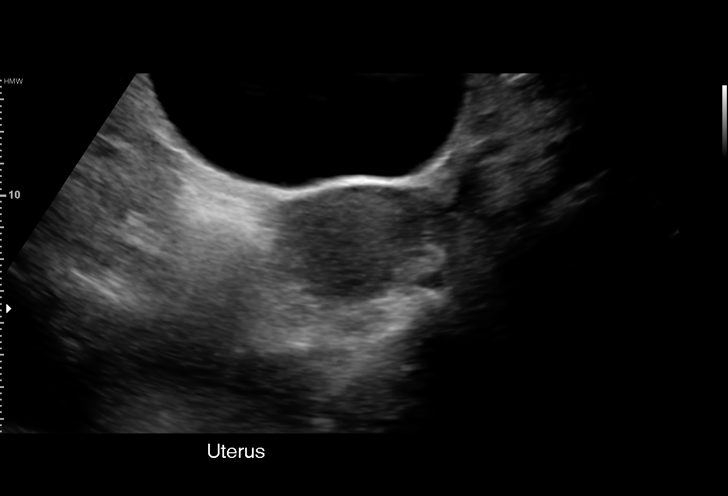
[im 19/63]
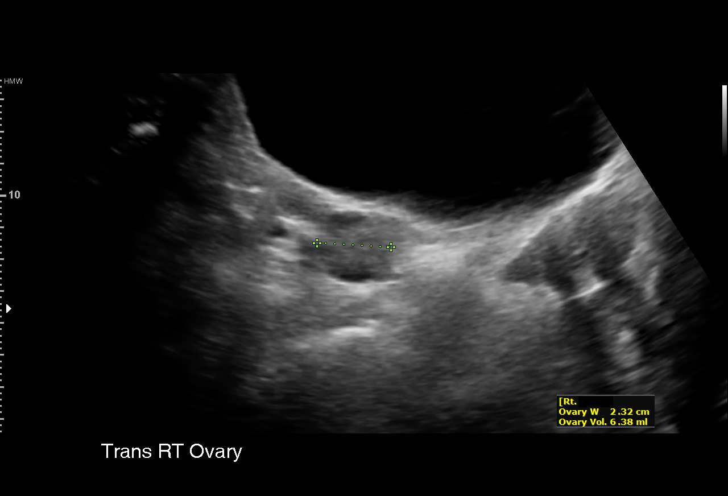
[im 24/63]
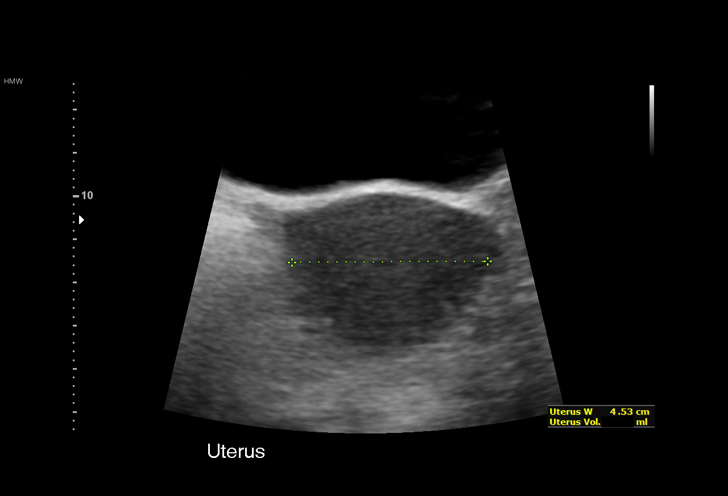
[im 26/63]
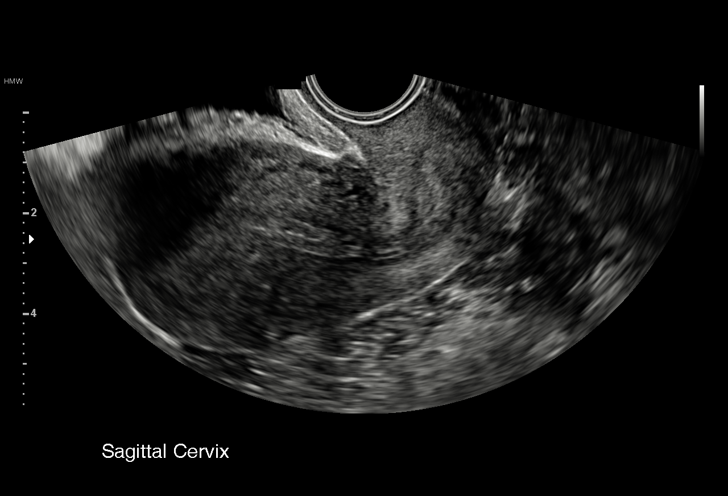
[im 32/63]
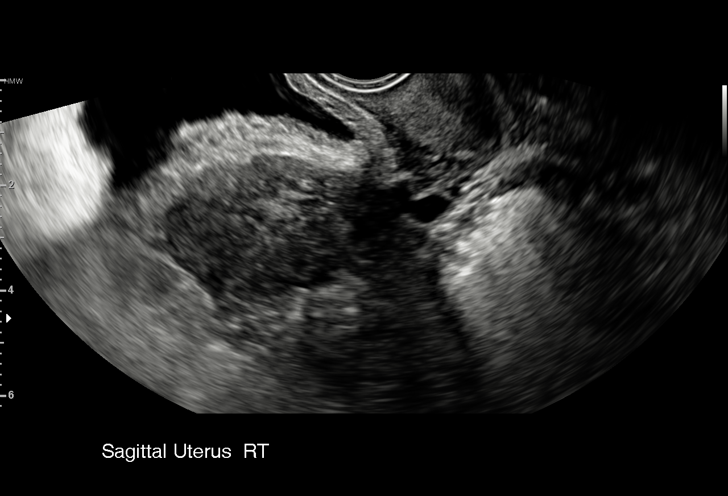
[im 37/63]
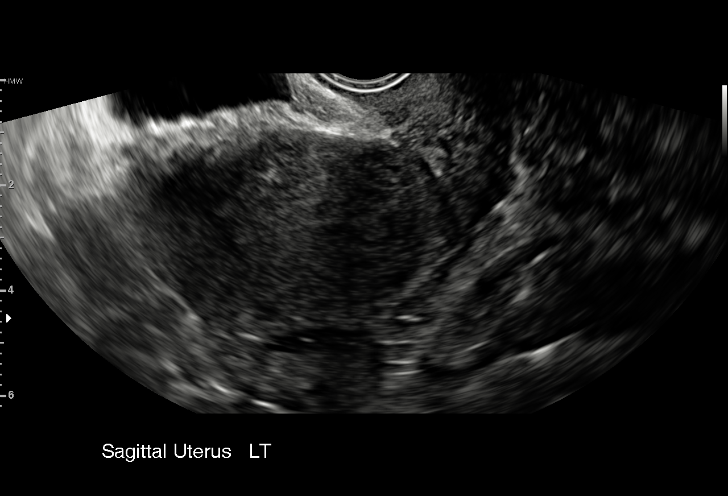
[im 39/63]
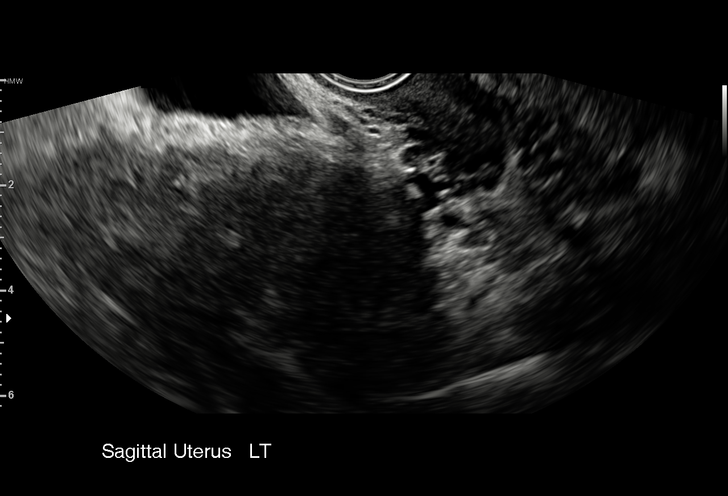
[im 44/63]
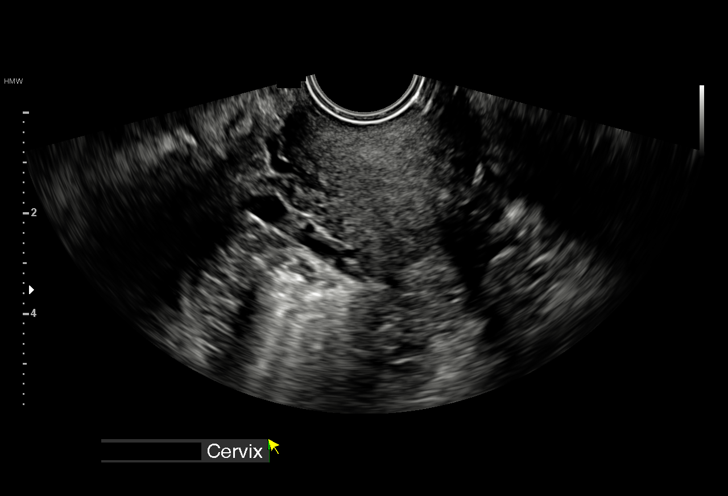
[im 50/63]
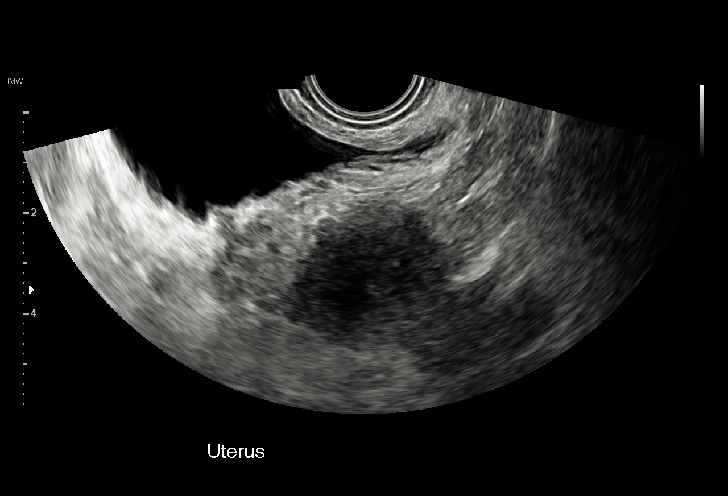
[im 52/63]
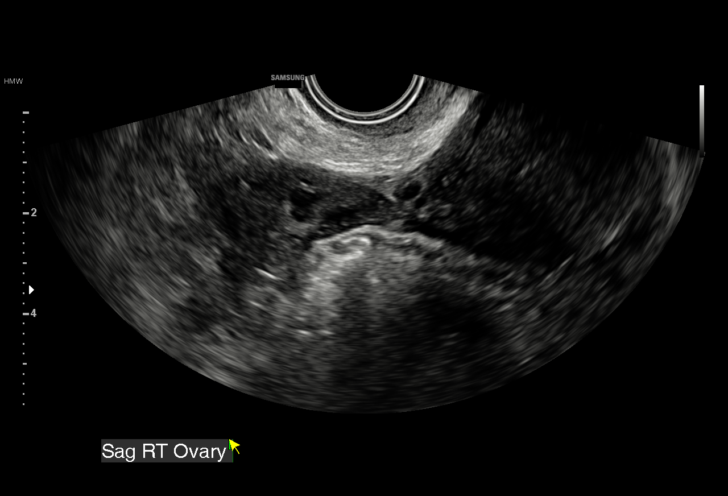
[im 57/63]
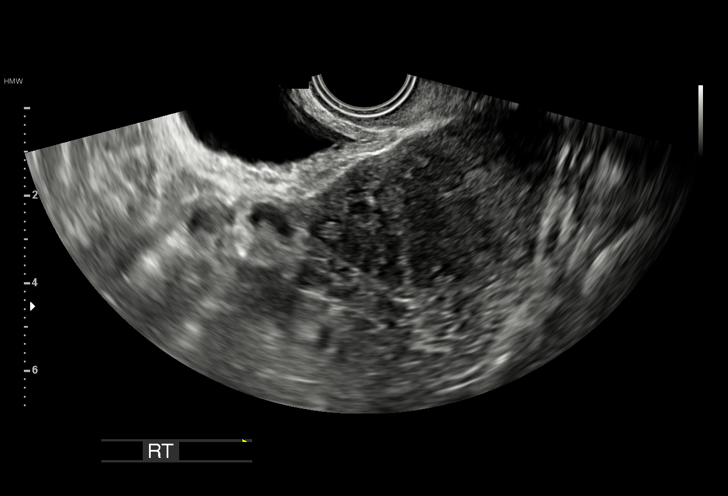
[im 63/63]
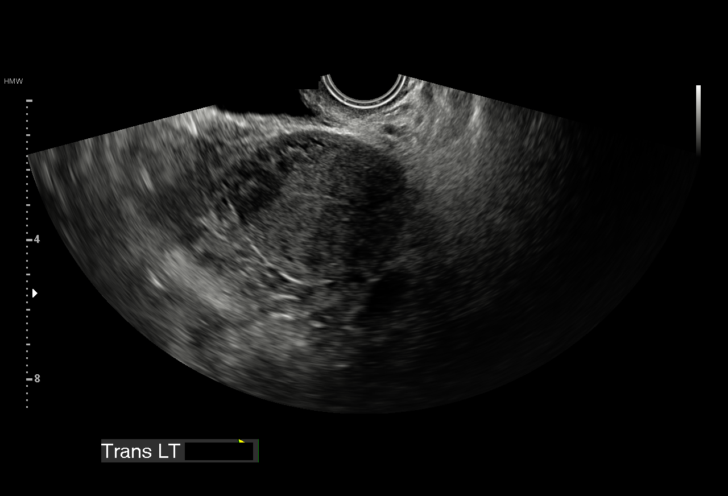

[15 of 25 positions shown; findings below may reference images not displayed]

FINDINGS: Uterus

Measurements: 7.4 x 4.2 x 4.7 cm. No fibroids or other mass
visualized.

Endometrium

Thickness: 6 mm.  No focal abnormality visualized.

Right ovary

Measurements: 2.4 x 2.4 x 2.8 cm. Normal appearance/no adnexal mass.

Left ovary

Measurements: 3.2 x 1.3 x 1.9 cm. Normal appearance/no adnexal mass.

Other findings

No abnormal free fluid.
IMPRESSION: 1. Normal pelvic sonogram.
2. If bleeding remains unresponsive to hormonal or medical therapy,
sonohysterogram should be considered for focal lesion work-up. (Ref:
Radiological Reasoning: Algorithmic Workup of Abnormal Vaginal
Bleeding with Endovaginal Sonography and Sonohysterography. AJR
[id number]; [id number])

## 2018-08-28 ENCOUNTER — Ambulatory Visit (INDEPENDENT_AMBULATORY_CARE_PROVIDER_SITE_OTHER): Payer: Medicaid Other

## 2018-08-28 DIAGNOSIS — F200 Paranoid schizophrenia: Secondary | ICD-10-CM

## 2018-08-28 NOTE — Progress Notes (Signed)
Met with patient in with her Mother for her due Invega Sustenna 156 mg, IM injection every month.  Patient denied any current suicidal or homicidal ideations and no present auditory or visual hallucinations but both patient and her Mother reported that her injection seems to be wearing off after 2 -3 weeks. I scheduled them to come in next Friday to talk with Dr. Arfeen. Patient's due injection prepared and given to patient in her right deltoid area as she tolerated without complaint of pain or discomfort.  Patient agreed to return on 1/17 for the appointment with Dr. Arfeen and in one month for her injection  

## 2018-09-02 ENCOUNTER — Encounter (HOSPITAL_COMMUNITY): Payer: Self-pay | Admitting: Licensed Clinical Social Worker

## 2018-09-02 ENCOUNTER — Ambulatory Visit (INDEPENDENT_AMBULATORY_CARE_PROVIDER_SITE_OTHER): Payer: Medicaid Other | Admitting: Licensed Clinical Social Worker

## 2018-09-02 DIAGNOSIS — F200 Paranoid schizophrenia: Secondary | ICD-10-CM | POA: Diagnosis not present

## 2018-09-02 NOTE — Progress Notes (Signed)
   THERAPIST PROGRESS NOTE  Session Time: 1-2  Participation Level: Active  Behavioral Response: Well GroomedAlertEuphoric  Type of Therapy: Individual Therapy  Treatment Goals addressed: Coping  Interventions: Supportive  Summary: Lorraine Bryant is a 33 y.o. female who presents with Paranoid Schizophrenia.   Pt is active, engaged, happy, and smiles often in session. She reports that "God has given her a visualization of land that she will acquire in [place]." Pt states her and her mother drove to [place], asking God for directions on how to find this land. Pt believes she has located it and will work to "claim it" as her own.  Suicidal/Homicidal: Nowithout intent/plan  Therapist Response: Pt continues to believe delusions that she will be able to ask for land and receive it. PT continues to deny ongoing anxiety, depression, or communication problems.   Plan: Return again in 4 weeks.  Diagnosis:    ICD-10-CM   1. Schizophrenia, paranoid (HCC) F20.0       [name], LCAS-A 09/02/2018

## 2018-09-04 ENCOUNTER — Encounter (HOSPITAL_COMMUNITY): Payer: Self-pay | Admitting: Psychiatry

## 2018-09-04 ENCOUNTER — Ambulatory Visit (INDEPENDENT_AMBULATORY_CARE_PROVIDER_SITE_OTHER): Payer: Medicaid Other | Admitting: Psychiatry

## 2018-09-04 VITALS — BP 122/74 | Ht 64.0 in | Wt 253.0 lb

## 2018-09-04 DIAGNOSIS — F200 Paranoid schizophrenia: Secondary | ICD-10-CM

## 2018-09-04 DIAGNOSIS — Z79899 Other long term (current) drug therapy: Secondary | ICD-10-CM | POA: Diagnosis not present

## 2018-09-04 NOTE — Progress Notes (Signed)
BH MD/PA/NP OP Progress Note  09/04/2018 11:11 AM [name]  MRN:  [id number]  Chief Complaint: I am doing fine.  I am thinking to apply in the school.  I was worried about my medicine wearing off but realized it usually happens around my monthly cycle.  HPI: Patient came for her follow-up appointment with her mother.  She is been doing very well on her current medication.  She denies any recent episode of severe anxiety or paranoia.  She is getting [place] 156 mg along with Seroquel 50 mg at bedtime.  Since we cut down the Seroquel her sleep is much better but sometimes he does not take the medication at the same time.  She noticed in the beginning that her injection is wearing off but realized it usually happens around her monthly menstrual cycle.  She does not want to change the medication since it is working very well.  She denies any irritability, anger or any mania.  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  She is hoping to apply for this fall.  She has no tremors shakes or any EPS.  She do not recall any breast discharge what happened when she was taking the higher dose of medication.  She is taking 2 antipsychotic medication and we have discussed in the past about the side effects of antipsychotic medication.  Visit Diagnosis:    ICD-10-CM   1. Encounter for long-term (current) use of medications Z79.899 Hemoglobin A1c    CBC with Differential    Prolactin    TSH    Comprehensive metabolic panel    Comprehensive metabolic panel    TSH    Prolactin    CBC with Differential    Hemoglobin A1c  2. Schizophrenia, paranoid (HCC) F20.0     Past Psychiatric History: Reviewed. History of multiple psychiatric hospitalization including at [facility] [place] [place] [facility].  Tried trazodone, Vistaril, Cogentin, Prolixin injection, Haldol, Abilify, olanzapine, Tegretol, [place] Sustena  and [place].  Abilify make her restless, Latuda causes nausea and olanzapine caused weight gain.  Past Medical History:  Past Medical History:  Diagnosis Date  . Depression   . Hallucinations   . Psychosis (HCC)    per pt and family last year    Past Surgical History:  Procedure Laterality Date  . NO PAST SURGERIES      Family Psychiatric History: Reviewed.  Family History:  Family History  Problem Relation Age of Onset  . Drug abuse Father   . Schizophrenia Maternal Aunt     Social History:  Social History   Socioeconomic History  . Marital status: Single    Spouse name: Not on file  . Number of children: Not on file  . Years of education: Not on file  . Highest education level: Not on file  Occupational History  . Not on file  Social Needs  . Financial resource strain: Not on file  . Food insecurity:    Worry: Not on file    Inability: Not on file  . Transportation needs:    Medical: Not on file    Non-medical: Not on file  Tobacco Use  . Smoking status: Never Smoker  . Smokeless tobacco: Never Used  Substance and Sexual Activity  . Alcohol use: No  . Drug use: No    Comment: unable to assess d/t pt not responding to questioning  . Sexual activity:  Never  Lifestyle  . Physical activity:    Days per week: Not on file    Minutes per session: Not on file  . Stress: Not on file  Relationships  . Social connections:    Talks on phone: Not on file    Gets together: Not on file    Attends religious service: Not on file    Active member of club or organization: Not on file    Attends meetings of clubs or organizations: Not on file    Relationship status: Not on file  Other Topics Concern  . Not on file  Social History Narrative  . Not on file    Allergies:  Allergies  Allergen Reactions  . Abilify [Aripiprazole] Other (See Comments)    Slow moving and hand cramping up    Metabolic Disorder Labs: Lab Results  Component Value Date   HGBA1C 5.8  (H) 02/23/2018   MPG 120 02/23/2018   Lab Results  Component Value Date   PROLACTIN 137.1 (H) 02/23/2018   PROLACTIN 84.3 (H) 09/01/2017   No results found for: CHOL, TRIG, HDL, CHOLHDL, VLDL, LDLCALC Lab Results  Component Value Date   TSH 0.96 02/23/2018   TSH 0.543 11/27/2011    Therapeutic Level Labs: No results found for: LITHIUM No results found for: VALPROATE No components found for:  CBMZ  Current Medications: Current Outpatient Medications  Medication Sig Dispense Refill  . paliperidone (INVEGA SUSTENNA) 156 MG/ML SUSY injection Inject 1 mL (156 mg total) into the muscle once for 1 dose. 1 mL 2  . QUEtiapine (SEROQUEL) 100 MG tablet Take half to one at bed time 30 tablet 2   Current Facility-Administered Medications  Medication Dose Route Frequency Provider Last Rate Last Dose  . paliperidone (INVEGA SUSTENNA) injection 156 mg  156 mg Intramuscular Q30 days Burnard Leigh, MD   156 mg at 08/28/18 1144     Musculoskeletal: Strength & Muscle Tone: within normal limits Gait & Station: normal Patient leans: N/A  Psychiatric Specialty Exam: ROS  Blood pressure 122/74, height 5\' 4"  (1.626 m), weight 253 lb (114.8 kg).Body mass index is 43.43 kg/m.  General Appearance: Casual  Eye Contact:  Good  Speech:  Clear and Coherent  Volume:  Normal  Mood:  Anxious  Affect:  Appropriate  Thought Process:  Descriptions of Associations: Intact  Orientation:  Full (Time, Place, and Person)  Thought Content: WDL   Suicidal Thoughts:  No  Homicidal Thoughts:  No  Memory:  Immediate;   Good Recent;   Good Remote;   Good  Judgement:  Good  Insight:  Present  Psychomotor Activity:  Normal  Concentration:  Concentration: Fair and Attention Span: Fair  Recall:  Good  Fund of Knowledge: Good  Language: Good  Akathisia:  No  Handed:  Right  AIMS (if indicated): not done  Assets:  Communication Skills Desire for Improvement Housing Resilience Social  Support Transportation  ADL's:  Intact  Cognition: WNL  Sleep:  Good   Screenings: AIMS     [facility]  AIMS Total Score  0    AUDIT     Ad[facility]  Alcohol Use Disorder Identification Test Final Score (AUDIT)  0    GAD-7     Office Visit from 07/02/2017 in [facility]  Total GAD-7 Score  0    PHQ2-9  [facility]  PHQ-2 Total Score  0  PHQ-9 Total Score  0       Assessment and Plan: Schizophrenia chronic paranoid type.  Generalized anxiety disorder.  Discussed her medication in detail.  She is concerned about medication wearing off but now realizes it happened around her monthly menstrual cycle.  I recommended she should discuss with her OB/GYN regarding hormonal imbalance.  I will defer increasing her antipsychotic medication as patient has a history of increased prolactin and breast discharge.  She agreed with the plan.  We will continue Seroquel 50 mg at bedtime, Invega Sustena 156 mg injection.  Every 4 weeks.  We will do blood work including prolactin level.  Recommended to call us back if she has any question or any concern.  Follow-up in 3 months.   Cleotis NipperSyed T Aubre Quincy, MD 09/04/2018, 11:11 AM

## 2018-09-07 LAB — COMPREHENSIVE METABOLIC PANEL
ALBUMIN: 4.3 g/dL (ref 3.5–5.5)
ALT: 22 IU/L (ref 0–32)
AST: 20 IU/L (ref 0–40)
Albumin/Globulin Ratio: 1.4 (ref 1.2–2.2)
Alkaline Phosphatase: 74 IU/L (ref 39–117)
BUN/Creatinine Ratio: 13 (ref 9–23)
BUN: 7 mg/dL (ref 6–20)
Bilirubin Total: 0.2 mg/dL (ref 0.0–1.2)
CALCIUM: 9.6 mg/dL (ref 8.7–10.2)
CHLORIDE: 106 mmol/L (ref 96–106)
CO2: 19 mmol/L — ABNORMAL LOW (ref 20–29)
Creatinine, Ser: 0.53 mg/dL — ABNORMAL LOW (ref 0.57–1.00)
GFR calc Af Amer: 147 mL/min/{1.73_m2} (ref >59)
GFR calc non Af Amer: 128 mL/min/{1.73_m2} (ref >59)
Globulin, Total: 3 g/dL (ref 1.5–4.5)
Glucose: 97 mg/dL (ref 65–99)
Potassium: 4 mmol/L (ref 3.5–5.2)
Sodium: 143 mmol/L (ref 134–144)
Total Protein: 7.3 g/dL (ref 6.0–8.5)

## 2018-09-07 LAB — CBC WITH DIFFERENTIAL/PLATELET
Basophils Absolute: 0.1 10*3/uL (ref 0.0–0.2)
Basos: 1 %
EOS (ABSOLUTE): 0.1 10*3/uL (ref 0.0–0.4)
EOS: 1 %
Hematocrit: 38.7 % (ref 34.0–46.6)
Hemoglobin: 12.6 g/dL (ref 11.1–15.9)
Immature Grans (Abs): 0 10*3/uL (ref 0.0–0.1)
Immature Granulocytes: 0 %
Lymphocytes Absolute: 2.1 10*3/uL (ref 0.7–3.1)
Lymphs: 33 %
MCH: 23.9 pg — ABNORMAL LOW (ref 26.6–33.0)
MCHC: 32.6 g/dL (ref 31.5–35.7)
MCV: 73 fL — ABNORMAL LOW (ref 79–97)
Monocytes Absolute: 0.4 10*3/uL (ref 0.1–0.9)
Monocytes: 7 %
Neutrophils Absolute: 3.7 10*3/uL (ref 1.4–7.0)
Neutrophils: 58 %
Platelets: 371 10*3/uL (ref 150–450)
RBC: 5.27 x10E6/uL (ref 3.77–5.28)
RDW: 14.9 % (ref 11.7–15.4)
WBC: 6.4 10*3/uL (ref 3.4–10.8)

## 2018-09-07 LAB — PROLACTIN: Prolactin: 209.4 ng/mL — ABNORMAL HIGH (ref 4.8–23.3)

## 2018-09-07 LAB — HEMOGLOBIN A1C
Est. average glucose Bld gHb Est-mCnc: 126 mg/dL
Hgb A1c MFr Bld: 6 % — ABNORMAL HIGH (ref 4.8–5.6)

## 2018-09-07 LAB — TSH: TSH: 2.17 u[IU]/mL (ref 0.450–4.500)

## 2018-09-08 ENCOUNTER — Ambulatory Visit (INDEPENDENT_AMBULATORY_CARE_PROVIDER_SITE_OTHER): Payer: Medicaid Other | Admitting: Licensed Clinical Social Worker

## 2018-09-08 DIAGNOSIS — F200 Paranoid schizophrenia: Secondary | ICD-10-CM | POA: Diagnosis not present

## 2018-09-09 ENCOUNTER — Encounter (HOSPITAL_COMMUNITY): Payer: Self-pay | Admitting: Licensed Clinical Social Worker

## 2018-09-09 NOTE — Progress Notes (Signed)
   THERAPIST PROGRESS NOTE  Session Time: 3-4pm  Participation Level: Active  Behavioral Response: Well GroomedAlertEuthymic  Type of Therapy: Individual Therapy  Treatment Goals addressed: Coping  Interventions: CBT  Summary: [name] is a 33 y.o. female who presents with Schizophrenia.  She denies any paranoia or anxiety. 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 She asks the counselor if he would "go into business w/ her". 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 Pt continues to verbalize spiritual beliefs that could be labeled as delusional.   Suicidal/Homicidal: Nowithout intent/plan  Therapist Response: Counselor used open questions, active listening, and supportive encouragement. Counselor helped PT to identify her business ideas and create small meaningful goals. PT delusionally believes she can start a clinic for specialized care w/o any previous training in running a clinic or starting a business. PT believes "God will give her what she wants if her faith is strong enough". 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   Plan: Return again in 4 weeks.  Diagnosis:    ICD-10-CM   1. Schizophrenia, paranoid (HCC) F20.0       Margo CommonWesley E Swan, LCAS-A 09/09/2018

## 2018-09-22 ENCOUNTER — Ambulatory Visit (HOSPITAL_COMMUNITY): Payer: Medicaid Other | Admitting: Licensed Clinical Social Worker

## 2018-09-29 ENCOUNTER — Ambulatory Visit (INDEPENDENT_AMBULATORY_CARE_PROVIDER_SITE_OTHER): Payer: Medicaid Other

## 2018-09-29 ENCOUNTER — Ambulatory Visit (HOSPITAL_COMMUNITY): Payer: Medicaid Other | Admitting: Licensed Clinical Social Worker

## 2018-09-29 DIAGNOSIS — F200 Paranoid schizophrenia: Secondary | ICD-10-CM | POA: Diagnosis not present

## 2018-09-29 NOTE — Progress Notes (Signed)
Met with patient in with her Mother for her due Kirt Boys 156 mg, IM injection every month. Patient denied any current suicidal or homicidal ideations and no present auditory or visual hallucinations.Patient's due injection prepared and given to patient in her left deltoid area as she tolerated without complaint of pain or discomfort. Patient agreed to return on 1/17 for the appointment with Dr. Adele Schilder and in one month for her injection

## 2018-10-05 ENCOUNTER — Ambulatory Visit (HOSPITAL_COMMUNITY): Payer: Medicaid Other | Admitting: Psychiatry

## 2018-10-06 ENCOUNTER — Ambulatory Visit (INDEPENDENT_AMBULATORY_CARE_PROVIDER_SITE_OTHER): Payer: Medicaid Other | Admitting: Licensed Clinical Social Worker

## 2018-10-06 DIAGNOSIS — F200 Paranoid schizophrenia: Secondary | ICD-10-CM

## 2018-10-09 ENCOUNTER — Encounter (HOSPITAL_COMMUNITY): Payer: Self-pay | Admitting: Licensed Clinical Social Worker

## 2018-10-09 NOTE — Progress Notes (Signed)
   THERAPIST PROGRESS NOTE  Session Time: 2:30-3:30  Participation Level: Active  Behavioral Response: Well GroomedAlertEuthymic  Type of Therapy: Individual Therapy  Treatment Goals addressed: Anxiety  Interventions: CBT  Summary: Lorraine Bryant is a 33 y.o. female who presents with Hx of Paranoid Schizophrenia. Counselor and PT discuss counselors transition to doing group therapy only and he would no longer be able to see PT. PT was agreeable to this and discus possible referrals for continued counseling. PT scheduled one more session.    Suicidal/Homicidal: Nowithout intent/plan  Therapist Response: Counselor used open questions, active listening, and goal setting for conclusion of counseling.   Plan: Return again in 2 weeks for final session.  Diagnosis:    ICD-10-CM   1. Schizophrenia, paranoid (HCC) F20.0        [name], LCAS-A 10/09/2018

## 2018-10-19 ENCOUNTER — Ambulatory Visit (HOSPITAL_COMMUNITY): Payer: Medicaid Other | Admitting: Licensed Clinical Social Worker

## 2018-10-20 ENCOUNTER — Ambulatory Visit (INDEPENDENT_AMBULATORY_CARE_PROVIDER_SITE_OTHER): Payer: Medicaid Other | Admitting: Licensed Clinical Social Worker

## 2018-10-20 DIAGNOSIS — F200 Paranoid schizophrenia: Secondary | ICD-10-CM

## 2018-10-22 ENCOUNTER — Ambulatory Visit (INDEPENDENT_AMBULATORY_CARE_PROVIDER_SITE_OTHER): Payer: Medicaid Other | Admitting: Psychiatry

## 2018-10-22 DIAGNOSIS — F200 Paranoid schizophrenia: Secondary | ICD-10-CM

## 2018-10-22 DIAGNOSIS — F411 Generalized anxiety disorder: Secondary | ICD-10-CM

## 2018-10-22 MED ORDER — PALIPERIDONE PALMITATE ER 156 MG/ML IM SUSY: 156.0000 mg | PREFILLED_SYRINGE | Freq: Once | INTRAMUSCULAR | 2 refills | Status: DC

## 2018-10-22 MED ORDER — QUETIAPINE FUMARATE 50 MG PO TABS: ORAL_TABLET | ORAL | 2 refills | Status: DC

## 2018-10-22 NOTE — Progress Notes (Signed)
BH MD/PA/NP OP Progress Note  10/22/2018 2:12 PM Lorraine Bryant  MRN:  [id number]  Chief Complaint: I am doing fine.  I am sleeping good.  HPI: Patient came to her appointment with the mother.  She is been compliant with injection Invega 156 mg every 4 weeks.  She has cut down her Seroquel from 100 mg to 50 mg and some nights she does not take it.  Overall she describes her mood is good.  She denies any paranoia, hallucination or any suicidal thoughts.  Her mother is concerned as sometime she believe patient talk to herself.  However patient denied that but admitted that sometimes she needs her own personal space.  She denies any suicidal thoughts.  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  She had a blood work on her last visit.  I reviewed blood work results.  Her prolactin level is high from the past and it is now 209.  Her hemoglobin A1c is 6.  All other labs are unremarkable.  Patient denies any breast discharge from high prolactin level.  She is reluctant to try a different medication or change the dose.  She has no tremors shakes or any EPS.  She is seeing Earnest Conroy for therapy but most likely switch as Earnest Conroy is doing addiction therapy.  Her appetite is okay.  Energy level is fair.  Visit Diagnosis:    ICD-10-CM   1. Paranoid schizophrenia (HCC) F20.0 QUEtiapine (SEROQUEL) 50 MG tablet    paliperidone (INVEGA SUSTENNA) 156 MG/ML SUSY injection  2. GAD (generalized anxiety disorder) F41.1 QUEtiapine (SEROQUEL) 50 MG tablet    paliperidone (INVEGA SUSTENNA) 156 MG/ML SUSY injection    Past Psychiatric History: Reviewed H/O multiple psychiatric hospitalization including at [facility] [place] [facility]. Tried trazodone, Vistaril, Cogentin, Prolixin injection, Haldol, Abilify, olanzapine, Tegretol, Invega Sustenaand Latuda. Abilify make  her restless, Latuda causes nausea and olanzapine caused weight gain.  Past Medical History:  Past Medical History:  Diagnosis Date  . Depression   . Hallucinations   . Psychosis (HCC)    per pt and family last year    Past Surgical History:  Procedure Laterality Date  . NO PAST SURGERIES      Family Psychiatric History: Reviewed.  Family History:  Family History  Problem Relation Age of Onset  . Drug abuse Father   . Schizophrenia Maternal Aunt     Social History:  Social History   Socioeconomic History  . Marital status: Single    Spouse name: Not on file  . Number of children: Not on file  . Years of education: Not on file  . Highest education level: Not on file  Occupational History  . Not on file  Social Needs  . Financial resource strain: Not on file  . Food insecurity:    Worry: Not on file    Inability: Not on file  . Transportation needs:    Medical: Not on file    Non-medical: Not on file  Tobacco Use  . Smoking status: Never Smoker  . Smokeless tobacco: Never Used  Substance and Sexual Activity  . Alcohol use: No  . Drug use: No    Comment: unable to assess d/t pt not responding to questioning  . Sexual activity: Never  Lifestyle  . Physical activity:    Days per week: Not on  file    Minutes per session: Not on file  . Stress: Not on file  Relationships  . Social connections:    Talks on phone: Not on file    Gets together: Not on file    Attends religious service: Not on file    Active member of club or organization: Not on file    Attends meetings of clubs or organizations: Not on file    Relationship status: Not on file  Other Topics Concern  . Not on file  Social History Narrative  . Not on file    Allergies:  Allergies  Allergen Reactions  . Abilify [Aripiprazole] Other (See Comments)    Slow moving and hand cramping up    Metabolic Disorder Labs: Recent Results (from the past 2160 hour(s))  Comprehensive metabolic panel      Status: Abnormal   Collection Time: 09/04/18 11:05 AM  Result Value Ref Range   Glucose 97 65 - 99 mg/dL   BUN 7 6 - 20 mg/dL   Creatinine, Ser 0.04 (L) 0.57 - 1.00 mg/dL   GFR calc non Af Amer 128 >59 mL/min/1.73   GFR calc Af Amer 147 >59 mL/min/1.73   BUN/Creatinine Ratio 13 9 - 23   Sodium 143 134 - 144 mmol/L   Potassium 4.0 3.5 - 5.2 mmol/L   Chloride 106 96 - 106 mmol/L   CO2 19 (L) 20 - 29 mmol/L   Calcium 9.6 8.7 - 10.2 mg/dL   Total Protein 7.3 6.0 - 8.5 g/dL   Albumin 4.3 3.5 - 5.5 g/dL    Comment:     **Effective September 07, 2018 Albumin reference**       interval will be changing to:              Age                Female          Female           0 -  7 days        3.6 - 4.9      3.6 - 4.9           8 - 30 days        3.4 - 4.7      3.4 - 4.7           1 -  6 month       3.7 - 4.8      3.7 - 4.8    7 months -  2 years       3.9 - 5.0      3.9 - 5.0           3 -  5 years       4.0 - 5.0      4.0 - 5.0           6 - 12 years       4.1 - 5.0      4.0 - 5.0          13 - 30 years       4.1 - 5.2      3.9 - 5.0          31 - 50 years       4.0 - 5.0      3.8 - 4.8          51 - 60 years  3.8 - 4.9      3.8 - 4.9          61 - 70 years       3.8 - 4.8      3.8 - 4.8          71 - 80 years       3.7 - 4.7      3.7 - 4.7          81 - 89 years       3.6 - 4.6      3.6 - 4.6              >89 years       3.5 - 4.6      3.5 - 4.6    Globulin, Total 3.0 1.5 - 4.5 g/dL   Albumin/Globulin Ratio 1.4 1.2 - 2.2   Bilirubin Total 0.2 0.0 - 1.2 mg/dL   Alkaline Phosphatase 74 39 - 117 IU/L   AST 20 0 - 40 IU/L   ALT 22 0 - 32 IU/L  TSH     Status: None   Collection Time: 09/04/18 11:05 AM  Result Value Ref Range   TSH 2.170 0.450 - 4.500 uIU/mL  Prolactin     Status: Abnormal   Collection Time: 09/04/18 11:05 AM  Result Value Ref Range   Prolactin 209.4 (H) 4.8 - 23.3 ng/mL  CBC with Differential     Status: Abnormal   Collection Time: 09/04/18 11:05 AM  Result Value  Ref Range   WBC 6.4 3.4 - 10.8 x10E3/uL   RBC 5.27 3.77 - 5.28 x10E6/uL   Hemoglobin 12.6 11.1 - 15.9 g/dL   Hematocrit 16.1 09.6 - 46.6 %   MCV 73 (L) 79 - 97 fL   MCH 23.9 (L) 26.6 - 33.0 pg   MCHC 32.6 31.5 - 35.7 g/dL   RDW 04.5 40.9 - 81.1 %    Comment:               **Please note reference interval change**   Platelets 371 150 - 450 x10E3/uL   Neutrophils 58 Not Estab. %   Lymphs 33 Not Estab. %   Monocytes 7 Not Estab. %   Eos 1 Not Estab. %   Basos 1 Not Estab. %   Neutrophils Absolute 3.7 1.4 - 7.0 x10E3/uL   Lymphocytes Absolute 2.1 0.7 - 3.1 x10E3/uL   Monocytes Absolute 0.4 0.1 - 0.9 x10E3/uL   EOS (ABSOLUTE) 0.1 0.0 - 0.4 x10E3/uL   Basophils Absolute 0.1 0.0 - 0.2 x10E3/uL   Immature Granulocytes 0 Not Estab. %   Immature Grans (Abs) 0.0 0.0 - 0.1 x10E3/uL  Hemoglobin A1c     Status: Abnormal   Collection Time: 09/04/18 11:05 AM  Result Value Ref Range   Hgb A1c MFr Bld 6.0 (H) 4.8 - 5.6 %    Comment:          Prediabetes: 5.7 - 6.4          Diabetes: >6.4          Glycemic control for adults with diabetes: <7.0    Est. average glucose Bld gHb Est-mCnc 126 mg/dL   Lab Results  Component Value Date   HGBA1C 6.0 (H) 09/04/2018   MPG 120 02/23/2018   Lab Results  Component Value Date   PROLACTIN 209.4 (H) 09/04/2018   PROLACTIN 137.1 (H) 02/23/2018   No results found for: CHOL, TRIG, HDL, CHOLHDL, VLDL, LDLCALC Lab  Results  Component Value Date   TSH 2.170 09/04/2018   TSH 0.96 02/23/2018    Therapeutic Level Labs: No results found for: LITHIUM No results found for: VALPROATE No components found for:  CBMZ  Current Medications: Current Outpatient Medications  Medication Sig Dispense Refill  . paliperidone (INVEGA SUSTENNA) 156 MG/ML SUSY injection Inject 1 mL (156 mg total) into the muscle once for 1 dose. 1 mL 2  . QUEtiapine (SEROQUEL) 100 MG tablet Take half to one at bed time 30 tablet 2   Current Facility-Administered Medications   Medication Dose Route Frequency Provider Last Rate Last Dose  . paliperidone (INVEGA SUSTENNA) injection 156 mg  156 mg Intramuscular Q30 days Burnard Leigh, MD   156 mg at 09/29/18 1402     Musculoskeletal: Strength & Muscle Tone: within normal limits Gait & Station: normal Patient leans: N/A  Psychiatric Specialty Exam: ROS  Blood pressure 91/63, pulse 64, height 5\' 4"  (1.626 m), weight 250 lb (113.4 kg).Body mass index is 42.91 kg/m.  General Appearance: Casual  Eye Contact:  Good  Speech:  Clear and Coherent  Volume:  Normal  Mood:  Euthymic  Affect:  Appropriate  Thought Process:  Goal Directed  Orientation:  Full (Time, Place, and Person)  Thought Content: Logical   Suicidal Thoughts:  No  Homicidal Thoughts:  No  Memory:  Immediate;   Good Recent;   Good Remote;   Good  Judgement:  Good  Insight:  Good  Psychomotor Activity:  Normal  Concentration:  Concentration: Fair and Attention Span: Fair  Recall:  [employer] of Knowledge: Fair  Language: Good  Akathisia:  No  Handed:  Right  AIMS (if indicated): not done  Assets:  Communication Skills Desire for Improvement Housing Resilience  ADL's:  Intact  Cognition: WNL  Sleep:  Good   Screenings: AIMS     [facility]  AIMS Total Score  0    AUDIT     [facility]  Alcohol Use Disorder Identification Test Final Score (AUDIT)  0    GAD-7     Office Visit from 07/02/2017 in [facility]  Total GAD-7 Score  0    PHQ2-9     Office Visit from 07/02/2017 in [facility]  PHQ-2 Total Score  0  PHQ-9 Total Score  0       Assessment and Plan: Is a Fina chronic paranoid type.  Generalized anxiety disorder.  Increased prolactin level.  I reviewed blood work results with the patient and her mother.  Patient is  reluctant to try a different medication since she is doing so much better on current medication.  I recommended she should consult endocrinologist for high protein level.  Patient has no current symptoms including galacturia or discharge.  She has no tenderness.  I recommended to reduce Seroquel 50 mg to take half to 1 tablet.  Encourage healthy lifestyle and watch her calorie intake.  Recommended continue therapy.  Discussed safety concern and recommend anytime having suicidal thoughts or homicidal thought that she need to call 911 or go to local emergency room.  I will see her again in 3 months.   Cleotis Nipper, MD 10/22/2018, 2:12 PM

## 2018-10-23 ENCOUNTER — Encounter (HOSPITAL_COMMUNITY): Payer: Self-pay | Admitting: Licensed Clinical Social Worker

## 2018-10-23 NOTE — Progress Notes (Signed)
   THERAPIST PROGRESS NOTE  Session Time: 4:30-5:30  Participation Level: Active  Behavioral Response: Well GroomedAlertEuthymic  Type of Therapy: Individual Therapy  Treatment Goals addressed: Coping  Interventions: CBT and Supportive  Summary: Kielynn Goin is a 33 y.o. female who presents with Schizophrenia, currently well managed on monthly Abilify injection. She denies any psychosis or delusions in past 1 month. 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 She and counselor discuss plan for PT to transition to different counselor since counselor is moving towards doing more group and IOP level of care. PT verbalizes understanding and is interested in meeting w/ someone else. PT is provided w/ contact information to [facility] and/or Alice Rieger in this office. PT is aware that Jessie's schedule is very full but is interested in being seen once per month or every other month for ongoing processing and to sustain her success.   Suicidal/Homicidal: Nowithout intent/plan  Therapist Response: Counselor used open questions, active listening, and empathic reflection. Counselor coordinated w/ PT to plan for her transition to another therapist.   Plan: Continue counseling w/ Alice Rieger or [facility].  Diagnosis:    ICD-10-CM   1. Schizophrenia, paranoid (HCC) F20.0        [name], LCAS-A 10/23/2018

## 2018-10-28 ENCOUNTER — Other Ambulatory Visit: Payer: Self-pay

## 2018-10-28 ENCOUNTER — Ambulatory Visit (INDEPENDENT_AMBULATORY_CARE_PROVIDER_SITE_OTHER): Payer: Medicaid Other

## 2018-10-28 DIAGNOSIS — F200 Paranoid schizophrenia: Secondary | ICD-10-CM

## 2018-10-28 NOTE — Progress Notes (Signed)
Met with patient in with her Mother for her due Kirt Boys 156 mg, IM injection every month. Patient denied any current suicidal or homicidal ideations and no present auditory or visual hallucinations.Patient's due injection prepared and given to patient in herrightdeltoid area as she tolerated without complaint of pain or discomfort. Patient agreed to return in one month for her injection

## 2018-10-30 ENCOUNTER — Ambulatory Visit (HOSPITAL_COMMUNITY): Payer: Medicaid Other

## 2018-11-27 ENCOUNTER — Other Ambulatory Visit: Payer: Self-pay

## 2018-11-27 ENCOUNTER — Ambulatory Visit (INDEPENDENT_AMBULATORY_CARE_PROVIDER_SITE_OTHER): Payer: Medicaid Other | Admitting: *Deleted

## 2018-11-27 ENCOUNTER — Encounter (HOSPITAL_COMMUNITY): Payer: Self-pay | Admitting: *Deleted

## 2018-11-27 VITALS — BP 112/79 | HR 72 | Temp 98.5°F | Resp 16

## 2018-11-27 DIAGNOSIS — F200 Paranoid schizophrenia: Secondary | ICD-10-CM | POA: Diagnosis not present

## 2018-11-27 MED ORDER — PALIPERIDONE PALMITATE ER 156 MG/ML IM SUSY
156.0000 mg | PREFILLED_SYRINGE | INTRAMUSCULAR | Status: DC
  Administered 2018-11-27 – 2018-12-28 (×2): 156 mg via INTRAMUSCULAR

## 2018-11-27 NOTE — Progress Notes (Signed)
Patient came for [facility] 156mg  injection. Smiling, pleasant and cooperative, denies any issues or complaints with medications. Given in Left Deltoid. Patient supplied medication. Lot #[id number].

## 2018-11-30 ENCOUNTER — Encounter: Payer: Medicaid Other | Admitting: Advanced Practice Midwife

## 2018-12-15 ENCOUNTER — Ambulatory Visit (HOSPITAL_COMMUNITY): Payer: Medicaid Other | Admitting: Psychiatry

## 2018-12-22 ENCOUNTER — Other Ambulatory Visit: Payer: Self-pay

## 2018-12-22 ENCOUNTER — Ambulatory Visit (INDEPENDENT_AMBULATORY_CARE_PROVIDER_SITE_OTHER): Payer: Medicaid Other | Admitting: Psychiatry

## 2018-12-22 ENCOUNTER — Encounter (HOSPITAL_COMMUNITY): Payer: Self-pay | Admitting: Psychiatry

## 2018-12-22 DIAGNOSIS — F200 Paranoid schizophrenia: Secondary | ICD-10-CM | POA: Diagnosis not present

## 2018-12-22 DIAGNOSIS — F411 Generalized anxiety disorder: Secondary | ICD-10-CM | POA: Diagnosis not present

## 2018-12-22 MED ORDER — PALIPERIDONE PALMITATE ER 156 MG/ML IM SUSY: 156.0000 mg | PREFILLED_SYRINGE | Freq: Once | INTRAMUSCULAR | 2 refills | Status: DC

## 2018-12-22 NOTE — Progress Notes (Signed)
Virtual Visit via Telephone Note  I connected with Lorraine Bryant on 12/22/18 at  1:00 PM EDT by telephone and verified that I am speaking with the correct person using two identifiers.   I discussed the limitations, risks, security and privacy concerns of performing an evaluation and management service by telephone and the availability of in person appointments. I also discussed with the patient that there may be a patient responsible charge related to this service. The patient expressed understanding and agreed to proceed.   History of Present Illness: Patient was evaluated through phone session.  She is no longer taking Seroquel as she is sleeping better and does not have any paranoia.  She also feels more energy during the day and she start walking every day in her neighborhood.  She is keeping social distancing and does not leave the house unless it is important.  She still have some anxiety but they are not as bad and he does not want to take any extra medicine.  She reported no tremors or shakes.  She denies any paranoia and hallucination.  Her mother was also available and she also reported the patient is doing very well on monthly Invega 156 mg injection.  Patient denies drinking or using any illegal substances.  She had blood work in January and her prolactin level was high.  However recently patient denies any breast discharge or lactation.  She is currently not seeing any therapist as she feels things are going well.  She is not having any issues with the medication.  She reported her appetite and weight is a stable.     Past Psychiatric History: Reviewed H/O multiple psychiatric hospitalization including at [facility] old [place] [facility] behavioral [facility] center. Tried trazodone, Vistaril, Cogentin, Prolixin injection, Haldol, Abilify, olanzapine, Tegretol, Invega Sustenaand Latuda. Abilify make her restless, Latuda causes nausea and olanzapine caused weight gain.    Observations/Objective: Mental status examination done on the phone.  Patient describes her mood euthymic.  Her speech is slow but clear and coherent.  There were no flight of ideas or loose association.  Her thought process logical and goal-directed.  Her attention concentration is okay.  She denies any auditory or visual hallucination.  She denies any active or passive suicidal thoughts or homicidal thought.  There were no delusions or grandiosity.  She is alert and oriented x3.  Her cognition is intact.  Her fund of knowledge is adequate.  She reported no tremors, shakes or any EPS.  Her immediate and recent memory is good.  Her insight and judgment is okay.  Assessment and Plan: Schizophrenia chronic paranoid type.  Generalized anxiety disorder.  Increase prolactin level.  I will discontinue Seroquel as patient is no longer taking it as she is sleeping better and she has more energy and her paranoia is well controlled with monthly injection.  Encouraged to continue walk every day and watch her calorie intake.  We will do prolactin level once pandemic coronavirus get under control and she can have a visit in office for blood work.  She does not want any medicine for anxiety at this time.  She is not seeing therapist as she feels that she does not need it.  Discussed medication side effects and benefits.  Recommended to call us back if she is any question or any concern.  Follow-up in 3 months.  Follow Up Instructions:    I discussed the assessment and treatment plan with the patient. The patient was provided an opportunity to  ask questions and all were answered. The patient agreed with the plan and demonstrated an understanding of the instructions.   The patient was advised to call back or seek an in-person evaluation if the symptoms worsen or if the condition fails to improve as anticipated.  I provided 20 minutes of non-face-to-face time during this encounter.   Kathlee Nations, MD

## 2018-12-28 ENCOUNTER — Encounter (HOSPITAL_COMMUNITY): Payer: Self-pay

## 2018-12-28 ENCOUNTER — Ambulatory Visit (INDEPENDENT_AMBULATORY_CARE_PROVIDER_SITE_OTHER): Payer: Medicaid Other

## 2018-12-28 ENCOUNTER — Other Ambulatory Visit: Payer: Self-pay

## 2018-12-28 DIAGNOSIS — F200 Paranoid schizophrenia: Secondary | ICD-10-CM | POA: Diagnosis not present

## 2018-12-28 NOTE — Progress Notes (Signed)
Met with who presented with flat affect, level mood and denied any suicidal or homicidal ideations, no current auditory or visual hallucinations and no plans or intent to want to harm self or others.  Patient and her Mother reported patient has been doing better but was interested in talking with Dr. Adele Schilder about a medication he had discussed possibly trying for anxiety and to help with sleep.  Patient and her Mother reported they would like to have something on hand for when patient does get a little anxious or has trouble with sleep.  Reported they just wanted something they could use PRN and agreed to send message to Dr. Adele Schilder about their request.  Patient reported no current problems with medications and doing good with montly Invega Sustenna 176m/1ml IM injections.  Patient brought in her medications.  Reviewed orders and medication prepared as ordered and given to patient in her requested right deltoid.  Patient tolerated injection without any report of pain or discomfort and agreed to return in a month for her next due injection.  Patient scheduled appointment for 01/27/19 when she stated she could return and agreed to call back if any worsening of symptoms or problems with medications prior to next appointment.

## 2019-01-10 IMAGING — MR MR HEAD WO/W CM
19 series · 48 of 48 positions shown · IV contrast (multihance)
Comparison: None.

CLINICAL DATA: Galactorrhea and hyperprolactinemia

EXAM:
MRI HEAD WITHOUT AND WITH CONTRAST
TECHNIQUE: Multiplanar, multiecho pulse sequences of the brain and surrounding
structures were obtained without and with intravenous contrast.
CONTRAST:  10mL MULTIHANCE GADOBENATE DIMEGLUMINE 529 MG/ML IV SOLN

[Series 5: T1 · sagittal · 4.0mm · 0.72mm/px · 2 of 27 slices shown (1 of 5)]
[im 1/27]
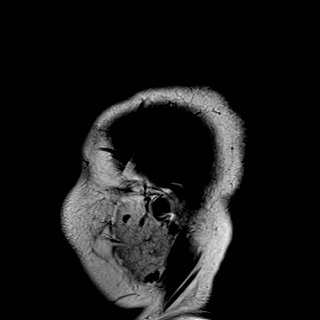
[im 27/27]
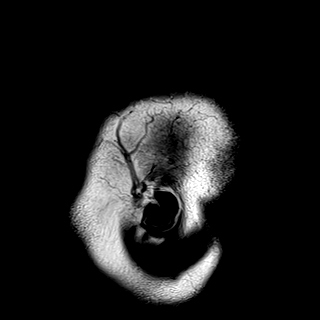

[Series 6: DWI · axial · 3.0mm · 1.44mm/px · z∈[-110,+47]mm · 7 of 84 slices shown (1 of 2)]
[im 1/84]
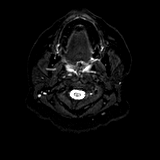
[im 14/84]
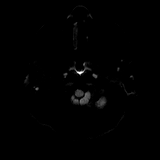
[im 28/84]
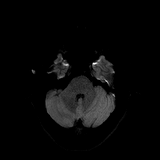
[im 42/84]
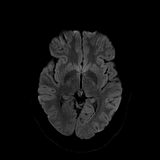
[im 56/84]
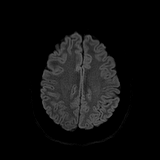
[im 70/84]
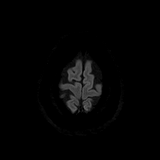
[im 84/84]
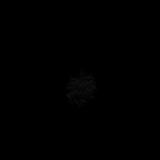

[Series 7: DWI · axial · 3.0mm · 1.44mm/px · z∈[-110,+47]mm · 4 of 42 slices shown (2 of 2)]
[im 1/42]
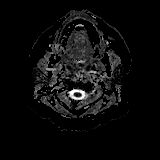
[im 14/42]
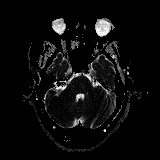
[im 28/42]
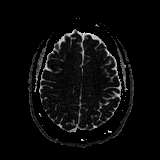
[im 42/42]
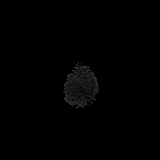

[Series 8: T2 · axial · 4.0mm · 0.36mm/px · z∈[-98,+34]mm · 2 of 27 slices shown]
[im 1/27]
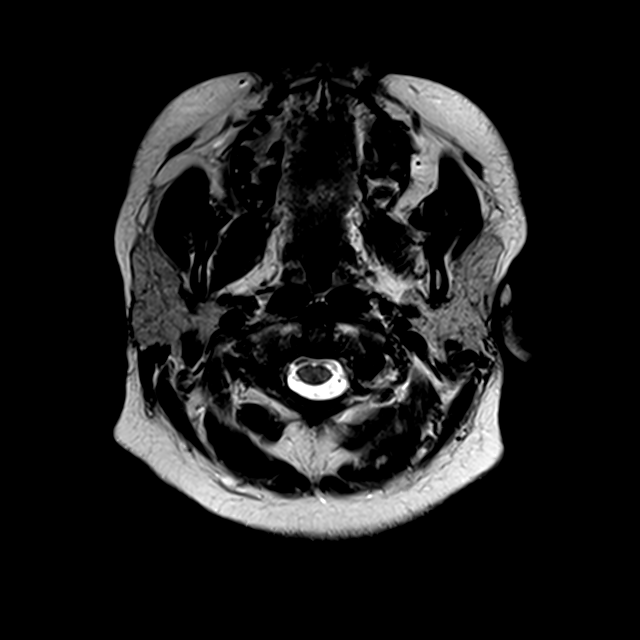
[im 27/27]
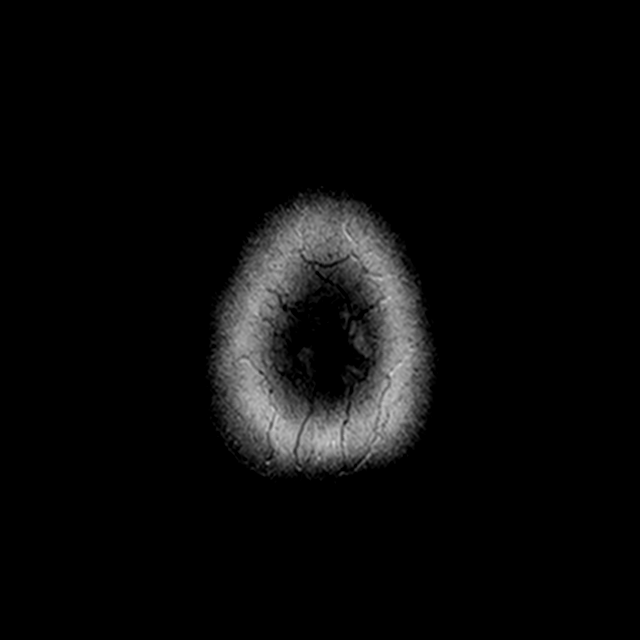

[Series 9: GRE · axial · 4.0mm · 0.69mm/px · z∈[-96,+36]mm · 2 of 27 slices shown]
[im 1/27]
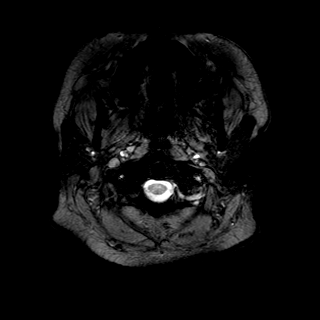
[im 27/27]
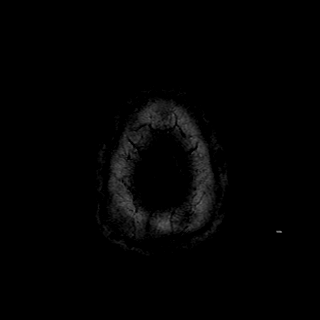

[Series 10: FLAIR · axial · 3.0mm · 0.72mm/px · z∈[-99,+36]mm · 2 of 24 slices shown]
[im 1/24]
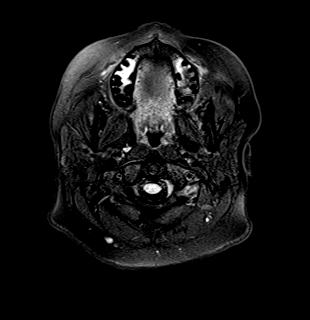
[im 24/24]
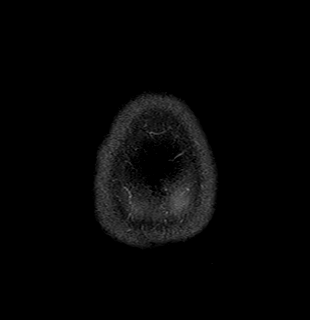

[Series 11: T1 · sagittal · 3.0mm · 0.42mm/px · 1 of 13 slices shown (2 of 5)]
[im 1/13]
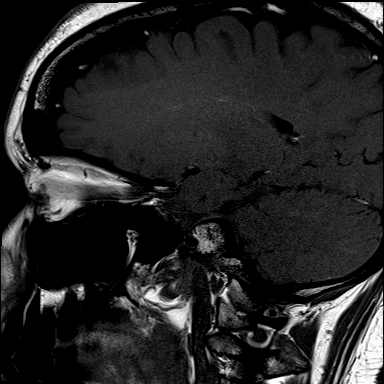

[Series 12: T1 · coronal · 3.0mm · 0.42mm/px · 1 of 10 slices shown (3 of 5)]
[im 1/10]
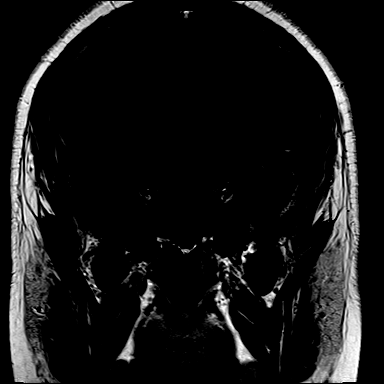

[Series 13: T1 · coronal · non-contrast · 3.0mm · 0.62mm/px · 1 of 9 slices shown (4 of 5)]
[im 1/9]
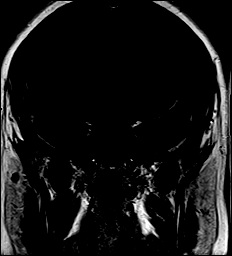

[Series 14: cor post dyn · coronal · 3.0mm · 0.62mm/px · 1 of 9 slices shown (1 of 6)]
[im 1/9]
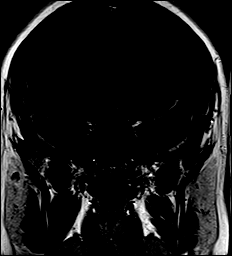

[Series 15: cor post dyn · coronal · 3.0mm · 0.62mm/px · 1 of 9 slices shown (2 of 6)]
[im 1/9]
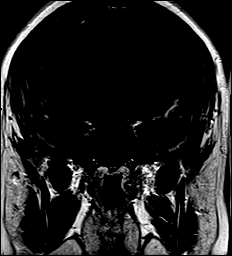

[Series 16: cor post dyn · coronal · 3.0mm · 0.62mm/px · 1 of 9 slices shown (3 of 6)]
[im 1/9]
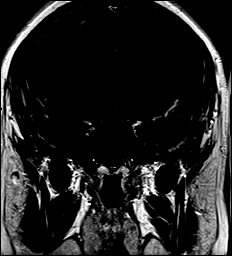

[Series 17: cor post dyn · coronal · 3.0mm · 0.62mm/px · 1 of 9 slices shown (4 of 6)]
[im 1/9]
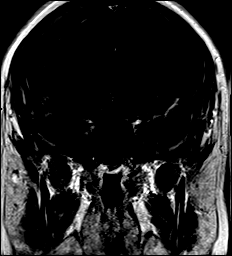

[Series 18: cor post dyn · coronal · 3.0mm · 0.62mm/px · 1 of 9 slices shown (5 of 6)]
[im 1/9]
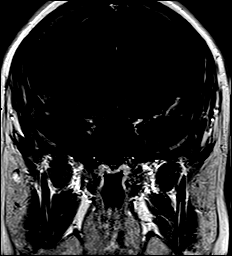

[Series 19: cor post dyn · coronal · 3.0mm · 0.62mm/px · 1 of 9 slices shown (6 of 6)]
[im 1/9]
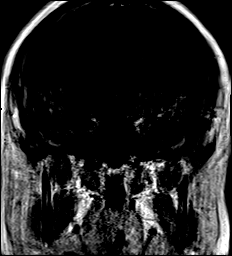

[Series 20: T1 post-contrast · sagittal · 3.0mm · 0.42mm/px · 1 of 13 slices shown (1 of 3)]
[im 1/13]
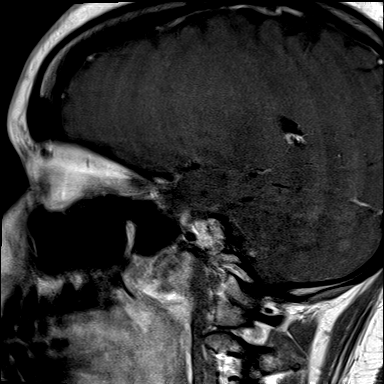

[Series 21: T1 post-contrast · coronal · 3.0mm · 0.42mm/px · 1 of 10 slices shown (2 of 3)]
[im 1/10]
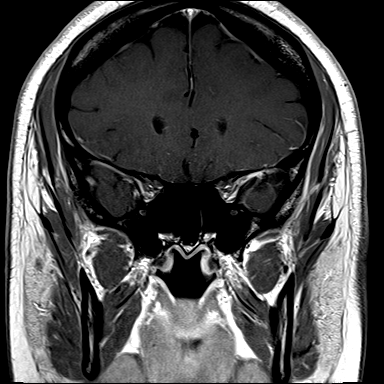

[Series 22: T1 · axial · 1.0mm · 0.86mm/px · z∈[-116,+55]mm · 15 of 175 slices shown (5 of 5)]
[im 1/175]
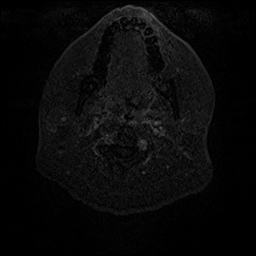
[im 13/175]
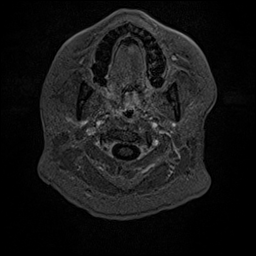
[im 25/175]
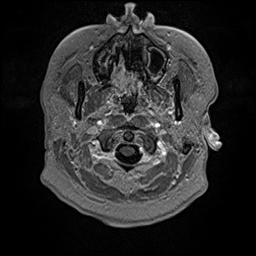
[im 38/175]
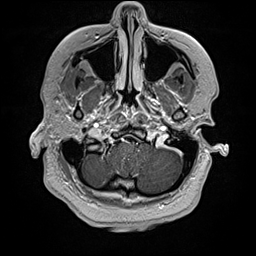
[im 50/175]
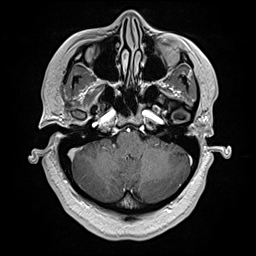
[im 63/175]
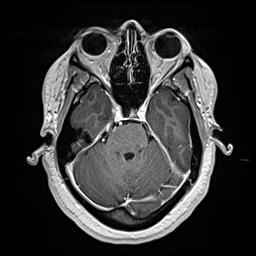
[im 75/175]
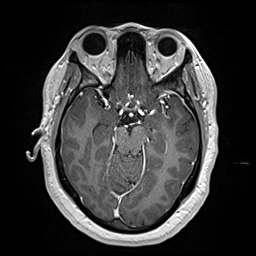
[im 88/175]
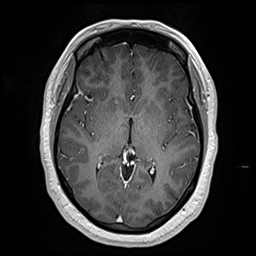
[im 100/175]
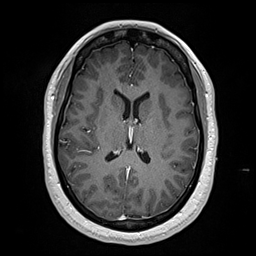
[im 112/175]
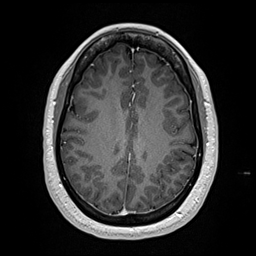
[im 125/175]
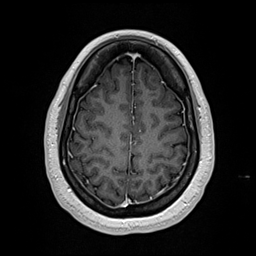
[im 137/175]
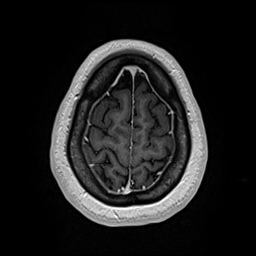
[im 150/175]
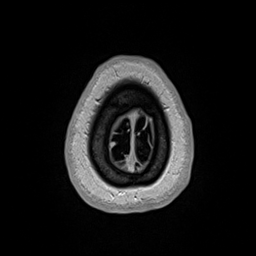
[im 162/175]
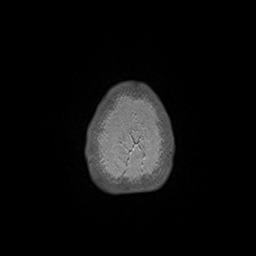
[im 175/175]
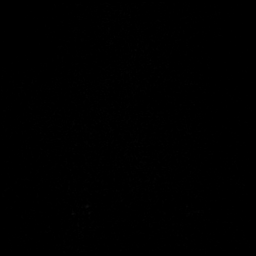

[Series 23: T1 post-contrast · coronal · 4.0mm · 0.47mm/px · 3 of 32 slices shown (3 of 3)]
[im 1/32]
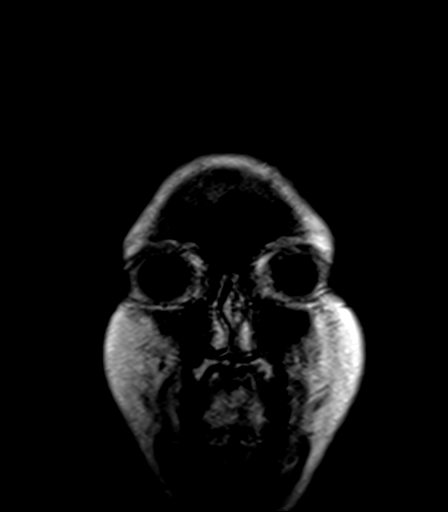
[im 16/32]
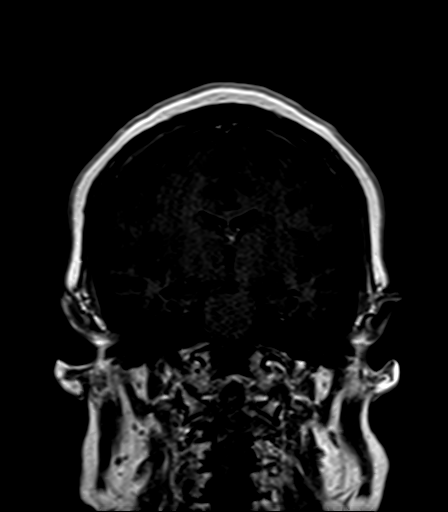
[im 32/32]
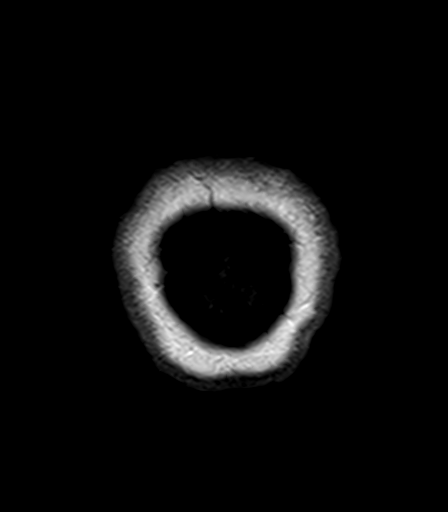

[48 of 48 positions shown; findings below may reference images not displayed]

FINDINGS: Brain: Normal pituitary size and shape. Homogeneous enhancement with
no detectable adenoma or other mass. Normal appearance of the chiasm
and cavernous sinuses.

Normal appearance of the brain - no infarct, blood products,
hydrocephalus, mass, or white matter disease.

Vascular: Major flow voids and vascular enhancements are preserved

Skull and upper cervical spine: Negative

Sinuses/Orbits: Negative
IMPRESSION: Normal exam.

## 2019-01-21 ENCOUNTER — Encounter (HOSPITAL_COMMUNITY): Payer: Self-pay | Admitting: Psychiatry

## 2019-01-21 ENCOUNTER — Other Ambulatory Visit: Payer: Self-pay

## 2019-01-21 ENCOUNTER — Ambulatory Visit (INDEPENDENT_AMBULATORY_CARE_PROVIDER_SITE_OTHER): Payer: Medicaid Other | Admitting: Psychiatry

## 2019-01-21 DIAGNOSIS — F411 Generalized anxiety disorder: Secondary | ICD-10-CM | POA: Diagnosis not present

## 2019-01-21 DIAGNOSIS — F200 Paranoid schizophrenia: Secondary | ICD-10-CM | POA: Diagnosis not present

## 2019-01-21 MED ORDER — HYDROXYZINE PAMOATE 25 MG PO CAPS: 25.0000 mg | ORAL_CAPSULE | Freq: Every day | ORAL | 0 refills | Status: DC | PRN

## 2019-01-21 MED ORDER — PALIPERIDONE ER 6 MG PO TB24: 6.0000 mg | ORAL_TABLET | Freq: Every day | ORAL | 1 refills | Status: DC

## 2019-01-21 NOTE — Progress Notes (Signed)
Virtual Visit via Telephone Note  I connected with Lorraine Bryant on 01/21/19 at  2:20 PM EDT by telephone and verified that I am speaking with the correct person using two identifiers.   I discussed the limitations, risks, security and privacy concerns of performing an evaluation and management service by telephone and the availability of in person appointments. I also discussed with the patient that there may be a patient responsible charge related to this service. The patient expressed understanding and agreed to proceed.   History of Present Illness: Patient was evaluated by phone session.  She requested earlier appointment because she like to stop taking injection.  Patient told she is not interested to continue injection as she heard about injection is contaminated after COVID-19.  Patient told she spoke to her pastor and he explained that if she can take the pill then she prefer not to take injection.  I spoke to patient in length about benefits of injection versus oral pill but she insist that she wants to try pill.  Her mother who was also close by felt the same but patient should not stopped injection.  Patient told that she has been doing very well.  She has no paranoia, hallucination, irritability, crying spells and she really like to try oral meds.  She reported no tremors, shakes or any EPS.  She has upcoming injection appointment on June 11 and she has no plan to come for the injection.  Patient had a good support system from her mother.  We have discontinued Seroquel on her last visit and she is sleeping better.  She is keeping social distancing and does not leave the house unless it is important.  She has some anxiety which is due to COVID-19 but she feels it is stable.  Her appetite is okay.  Her weight is stable.    Past Psychiatric History:Reviewed H/Omultiple psychiatric hospitalization including at [facility] old [place] and behavioral health center. Tried  trazodone, Vistaril, Cogentin, Prolixin injection, Haldol, Abilify, olanzapine, Tegretol, Invega Sustenaand Latuda. Abilify make her restless, Latuda causes nausea and olanzapine caused weight gain.    Psychiatric Specialty Exam: Physical Exam  ROS  There were no vitals taken for this visit.There is no height or weight on file to calculate BMI.  General Appearance: NA  Eye Contact:  NA  Speech:  Clear and Coherent  Volume:  Normal  Mood:  Euthymic  Affect:  NA  Thought Process:  Descriptions of Associations: Intact  Orientation:  Full (Time, Place, and Person)  Thought Content:  Logical  Suicidal Thoughts:  No  Homicidal Thoughts:  No  Memory:  Immediate;   Fair Recent;   Fair Remote;   Fair  Judgement:  Fair  Insight:  Fair  Psychomotor Activity:  NA  Concentration:  Concentration: Fair and Attention Span: Fair  Recall:  [employer] of Knowledge:  Good  Language:  Good  Akathisia:  No  Handed:  Right  AIMS (if indicated):     Assets:  Communication Skills Desire for Improvement Housing Social Support  ADL's:  Intact  Cognition:  WNL  Sleep:   fair      Assessment and Plan: Schizophrenia chronic paranoid type.  Generalized anxiety disorder.  I had a long discussion with the patient about risk of relapse and poor compliance with medication.  We talked about since taking the injection she has been very stable but she still want to try oral medication.  We talked about signs that there is  no injection contamination due to COVID-19 but she still feel that she should have a right to try oral pills.  I will respect her wish and we will discontinue injection in [place] and prescribe 9 mg Invega to take 1 daily.  However I reminded that if symptoms started to get worse or any change in her behavior then she should call us immediately.  Treatment plan also discussed with the mother.  I will follow-up in 2 months.  Discussed safety concern that anytime having active suicidal thoughts  or homicidal thought then she need to call 911 or go to local emergency room.  I will also add low-dose hydroxyzine to help her sleep and anxiety which she has taken in the past with good response.  Follow Up Instructions:    I discussed the assessment and treatment plan with the patient. The patient was provided an opportunity to ask questions and all were answered. The patient agreed with the plan and demonstrated an understanding of the instructions.   The patient was advised to call back or seek an in-person evaluation if the symptoms worsen or if the condition fails to improve as anticipated.  I provided 20 minutes of non-face-to-face time during this encounter.   Cleotis NipperSyed T Anjani Feuerborn, MD

## 2019-01-26 ENCOUNTER — Encounter: Payer: Self-pay | Admitting: *Deleted

## 2019-01-27 ENCOUNTER — Ambulatory Visit (HOSPITAL_COMMUNITY): Payer: Medicaid Other

## 2019-02-27 ENCOUNTER — Other Ambulatory Visit (HOSPITAL_COMMUNITY): Payer: Self-pay | Admitting: Psychiatry

## 2019-02-27 DIAGNOSIS — F411 Generalized anxiety disorder: Secondary | ICD-10-CM

## 2019-03-16 ENCOUNTER — Other Ambulatory Visit: Payer: Self-pay

## 2019-03-16 ENCOUNTER — Encounter (HOSPITAL_COMMUNITY): Payer: Self-pay

## 2019-03-16 DIAGNOSIS — S199XXA Unspecified injury of neck, initial encounter: Secondary | ICD-10-CM | POA: Diagnosis present

## 2019-03-16 DIAGNOSIS — Z8659 Personal history of other mental and behavioral disorders: Secondary | ICD-10-CM | POA: Diagnosis not present

## 2019-03-16 DIAGNOSIS — S39012A Strain of muscle, fascia and tendon of lower back, initial encounter: Secondary | ICD-10-CM | POA: Insufficient documentation

## 2019-03-16 DIAGNOSIS — Y9289 Other specified places as the place of occurrence of the external cause: Secondary | ICD-10-CM | POA: Insufficient documentation

## 2019-03-16 DIAGNOSIS — R443 Hallucinations, unspecified: Secondary | ICD-10-CM | POA: Diagnosis not present

## 2019-03-16 DIAGNOSIS — Y999 Unspecified external cause status: Secondary | ICD-10-CM | POA: Diagnosis not present

## 2019-03-16 DIAGNOSIS — F329 Major depressive disorder, single episode, unspecified: Secondary | ICD-10-CM | POA: Diagnosis not present

## 2019-03-16 DIAGNOSIS — S161XXA Strain of muscle, fascia and tendon at neck level, initial encounter: Secondary | ICD-10-CM | POA: Insufficient documentation

## 2019-03-16 DIAGNOSIS — Z79899 Other long term (current) drug therapy: Secondary | ICD-10-CM | POA: Insufficient documentation

## 2019-03-16 DIAGNOSIS — Y9389 Activity, other specified: Secondary | ICD-10-CM | POA: Diagnosis not present
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
## 2019-03-17 ENCOUNTER — Emergency Department (HOSPITAL_COMMUNITY)
Admission: EM | Admit: 2019-03-17 | Discharge: 2019-03-17 | Disposition: A | Discharge status: Home / Self Care | Payer: Medicaid Other | Attending: Emergency Medicine | Admitting: Emergency Medicine

## 2019-03-17 DIAGNOSIS — S39012A Strain of muscle, fascia and tendon of lower back, initial encounter: Secondary | ICD-10-CM

## 2019-03-17 DIAGNOSIS — S161XXA Strain of muscle, fascia and tendon at neck level, initial encounter: Secondary | ICD-10-CM

## 2019-03-17 MED ORDER — CYCLOBENZAPRINE HCL 10 MG PO TABS: 10.0000 mg | ORAL_TABLET | Freq: Two times a day (BID) | ORAL | 0 refills | Status: DC | PRN
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
## 2019-03-23 ENCOUNTER — Encounter (HOSPITAL_COMMUNITY): Payer: Self-pay | Admitting: Psychiatry

## 2019-03-23 ENCOUNTER — Other Ambulatory Visit: Payer: Self-pay

## 2019-03-23 ENCOUNTER — Ambulatory Visit (INDEPENDENT_AMBULATORY_CARE_PROVIDER_SITE_OTHER): Payer: Medicaid Other | Admitting: Psychiatry

## 2019-03-23 DIAGNOSIS — F200 Paranoid schizophrenia: Secondary | ICD-10-CM

## 2019-03-23 DIAGNOSIS — F411 Generalized anxiety disorder: Secondary | ICD-10-CM

## 2019-03-23 MED ORDER — HYDROXYZINE PAMOATE 25 MG PO CAPS: 25.0000 mg | ORAL_CAPSULE | Freq: Every day | ORAL | 0 refills | Status: DC | PRN

## 2019-03-23 MED ORDER — PALIPERIDONE ER 6 MG PO TB24: 6.0000 mg | ORAL_TABLET | Freq: Every day | ORAL | 1 refills | Status: DC
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
## 2019-04-25 ENCOUNTER — Other Ambulatory Visit (HOSPITAL_COMMUNITY): Payer: Self-pay | Admitting: Psychiatry

## 2019-04-25 DIAGNOSIS — F411 Generalized anxiety disorder: Secondary | ICD-10-CM

## 2019-05-24 ENCOUNTER — Other Ambulatory Visit: Payer: Self-pay

## 2019-05-24 ENCOUNTER — Ambulatory Visit (INDEPENDENT_AMBULATORY_CARE_PROVIDER_SITE_OTHER): Payer: Medicaid Other | Admitting: Psychiatry

## 2019-05-24 ENCOUNTER — Encounter (HOSPITAL_COMMUNITY): Payer: Self-pay | Admitting: Psychiatry

## 2019-05-24 DIAGNOSIS — F200 Paranoid schizophrenia: Secondary | ICD-10-CM | POA: Diagnosis not present

## 2019-05-24 DIAGNOSIS — F411 Generalized anxiety disorder: Secondary | ICD-10-CM

## 2019-05-24 MED ORDER — HYDROXYZINE PAMOATE 25 MG PO CAPS: 25.0000 mg | ORAL_CAPSULE | Freq: Every day | ORAL | 0 refills | Status: DC | PRN

## 2019-05-24 MED ORDER — PALIPERIDONE ER 6 MG PO TB24: 6.0000 mg | ORAL_TABLET | Freq: Every day | ORAL | 2 refills | Status: DC

## 2019-05-24 NOTE — Progress Notes (Signed)
Virtual Visit via Telephone Note  I connected with Maree Erie on 05/24/19 at  3:00 PM EDT by telephone and verified that I am speaking with the correct person using two identifiers.   I discussed the limitations, risks, security and privacy concerns of performing an evaluation and management service by telephone and the availability of in person appointments. I also discussed with the patient that there may be a patient responsible charge related to this service. The patient expressed understanding and agreed to proceed.   History of Present Illness: Patient was evaluated by phone session.  Her mother was also close by.  Patient told she is taking Invega 6 mg which is working very well for her.  She has been is stable.  She is sleeping good.  Sometimes she gets anxious and nervous but overall her mood is much better.  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  She does not take hydroxyzine every day.  She admitted some time feel like GOD is talking to her but most of the time she feels good about it and does not get scared.  She denies any paranoia.  She denies any irritability, anger, suicidal thoughts.  She has no tremors or shakes.  Her next challenge is to start walking 2-3 times a week.  She wants to continue InVega.  She lives with her mother who is very supportive.    Past Psychiatric History:Reviewed H/Omultiple psychiatric hospitalization including at [facility] [place] [facility] behavioral health center. Tried trazodone, Vistaril, Cogentin, Prolixin injection, Haldol, Abilify, olanzapine, Tegretol, Invega Sustenaand Latuda. Abilify make her restless, Latuda causes nausea and olanzapine caused weight gain.    Psychiatric Specialty Exam: Physical Exam  ROS  There were no vitals taken for this visit.There is no height or weight on file to calculate BMI.  General Appearance: NA  Eye Contact:  NA  Speech:  Clear and Coherent and Slow  Volume:  Normal   Mood:  Anxious  Affect:  NA  Thought Process:  Coherent and Goal Directed  Orientation:  Full (Time, Place, and Person)  Thought Content:  Rumination  Suicidal Thoughts:  No  Homicidal Thoughts:  No  Memory:  Immediate;   Good Recent;   Good Remote;   Good  Judgement:  Fair  Insight:  Fair  Psychomotor Activity:  NA  Concentration:  Concentration: Fair and Attention Span: Fair  Recall:  Good  Fund of Knowledge:  Good  Language:  Good  Akathisia:  No  Handed:  Right  AIMS (if indicated):     Assets:  Communication Skills Desire for Improvement Housing Resilience Social Support  ADL's:  Intact  Cognition:  WNL  Sleep:   ok      Assessment and Plan: Schizophrenia chronic paranoid type.  General anxiety disorder.  Patient doing well on her current medication.  She has no side effects.  She is taking hydroxyzine only as needed for anxiety.  Continue Invega 6 mg daily.  Recommended to call us back if she has any question or any concern.  Follow-up in 3 months.  Follow Up Instructions:    I discussed the assessment and treatment plan with the patient. The patient was provided an opportunity to ask questions and all were answered. The patient agreed with the plan and demonstrated an understanding of the instructions.   The patient was advised to call back or seek an in-person evaluation if the symptoms worsen or if the condition fails to improve as  anticipated.  I provided 20 minutes of non-face-to-face time during this encounter.   Kathlee Nations, MD

## 2019-07-02 ENCOUNTER — Other Ambulatory Visit (HOSPITAL_COMMUNITY): Payer: Self-pay | Admitting: Psychiatry

## 2019-07-02 DIAGNOSIS — F411 Generalized anxiety disorder: Secondary | ICD-10-CM

## 2019-07-05 ENCOUNTER — Other Ambulatory Visit (HOSPITAL_COMMUNITY): Payer: Self-pay | Admitting: Psychiatry

## 2019-07-05 DIAGNOSIS — F411 Generalized anxiety disorder: Secondary | ICD-10-CM

## 2019-07-05 MED ORDER — HYDROXYZINE PAMOATE 25 MG PO CAPS: 25.0000 mg | ORAL_CAPSULE | Freq: Every day | ORAL | 0 refills | Status: DC | PRN

## 2019-07-27 ENCOUNTER — Ambulatory Visit (HOSPITAL_COMMUNITY): Payer: Medicaid Other | Admitting: Psychiatry

## 2019-07-27 ENCOUNTER — Other Ambulatory Visit: Payer: Self-pay

## 2019-08-05 ENCOUNTER — Other Ambulatory Visit: Payer: Self-pay

## 2019-08-05 ENCOUNTER — Other Ambulatory Visit (HOSPITAL_COMMUNITY): Payer: Self-pay | Admitting: Psychiatry

## 2019-08-05 ENCOUNTER — Ambulatory Visit (HOSPITAL_COMMUNITY): Payer: Medicaid Other | Admitting: Psychiatry

## 2019-08-05 DIAGNOSIS — F411 Generalized anxiety disorder: Secondary | ICD-10-CM

## 2019-08-24 ENCOUNTER — Ambulatory Visit (INDEPENDENT_AMBULATORY_CARE_PROVIDER_SITE_OTHER): Payer: Medicaid Other | Admitting: Psychiatry

## 2019-08-24 ENCOUNTER — Encounter (HOSPITAL_COMMUNITY): Payer: Self-pay | Admitting: Psychiatry

## 2019-08-24 ENCOUNTER — Other Ambulatory Visit: Payer: Self-pay

## 2019-08-24 DIAGNOSIS — F411 Generalized anxiety disorder: Secondary | ICD-10-CM

## 2019-08-24 DIAGNOSIS — F200 Paranoid schizophrenia: Secondary | ICD-10-CM

## 2019-08-24 MED ORDER — PALIPERIDONE ER 6 MG PO TB24: 6.0000 mg | ORAL_TABLET | Freq: Every day | ORAL | 1 refills | Status: DC

## 2019-08-24 MED ORDER — HYDROXYZINE PAMOATE 25 MG PO CAPS: 25.0000 mg | ORAL_CAPSULE | Freq: Every day | ORAL | 0 refills | Status: DC | PRN

## 2019-08-24 NOTE — Progress Notes (Signed)
Virtual Visit via Telephone Note  I connected with Lorraine Bryant on 08/24/19 at  3:00 PM EST by telephone and verified that I am speaking with the correct person using two identifiers.   I discussed the limitations, risks, security and privacy concerns of performing an evaluation and management service by telephone and the availability of in person appointments. I also discussed with the patient that there may be a patient responsible charge related to this service. The patient expressed understanding and agreed to proceed.   History of Present Illness: Patient was evaluated by phone session.  Her mother was also close by.  Patient told that there are days that she missed the medication and she noticedshe feels more paranoia and having negative thoughts but denies any hallucination or any severe mood swings.  She also admitted some nights she is up very late until 2:00 in the morning and the next day she feels very sluggish.  She feels that paranoia comes in and comes out.  She is very spiritual and believe in God and when she feels paranoia she talks to God and that helps her.  She is trying to lose weight and so far she had lost 3 pounds since the last visit.  She had a good support from her mother.  Patient denies drinking or using any illegal substances.  She takes hydroxyzine on and off which helps her sleep but she usually takes it very late in the night.    Past Psychiatric History:Reviewed H/Omultiple psychiatric hospitalization including at [facility], old [place] and behavioral health center. Tried trazodone, Vistaril, Cogentin, Prolixin injection, Haldol, Abilify, olanzapine, Tegretol, Invega Sustenaand Latuda. Abilify make her restless, Latuda causes nausea and olanzapine caused weight gain.   Psychiatric Specialty Exam: Physical Exam  Review of Systems  There were no vitals taken for this visit.There is no height or weight on file to calculate BMI.  General  Appearance: NA  Eye Contact:  NA  Speech:  Clear and Coherent and Slow  Volume:  Normal  Mood:  Euthymic  Affect:  NA  Thought Process:  Descriptions of Associations: Intact  Orientation:  Full (Time, Place, and Person)  Thought Content:  Paranoid Ideation and Rumination  Suicidal Thoughts:  No  Homicidal Thoughts:  No  Memory:  Immediate;   Fair Recent;   Good Remote;   Good  Judgement:  Fair  Insight:  Fair  Psychomotor Activity:  NA  Concentration:  Concentration: Fair and Attention Span: Fair  Recall:  Good  Fund of Knowledge:  Fair  Language:  Good  Akathisia:  No  Handed:  Right  AIMS (if indicated):     Assets:  Communication Skills Desire for Improvement Housing Resilience Social Support  ADL's:  Intact  Cognition:  WNL  Sleep:   fair      Assessment and Plan: Schizophrenia chronic paranoid type.  Generalized anxiety disorder.  Discussed sleep hygiene and compliance with medication at a regular time to help her symptoms.  Encouraged not to up late at night and take the medication on time every day.  She has no tremors, shakes or any EPS.  I also encouraged take hydroxyzine latest until 10 PM and not to take 2:00 in the morning because it will give drowsy side effects in the morning.  She agreed with the plan.  She like to follow-up in 6 weeks if sleep hygiene and medication compliance helped.  I also recommend to call us back if she is any question or any  concern.  Follow-up in 6 weeks.  Follow Up Instructions:    I discussed the assessment and treatment plan with the patient. The patient was provided an opportunity to ask questions and all were answered. The patient agreed with the plan and demonstrated an understanding of the instructions.   The patient was advised to call back or seek an in-person evaluation if the symptoms worsen or if the condition fails to improve as anticipated.  I provided 20 minutes of non-face-to-face time during this  encounter.   Kathlee Nations, MD

## 2019-09-28 ENCOUNTER — Ambulatory Visit: Payer: Self-pay | Admitting: Student

## 2019-09-30 ENCOUNTER — Ambulatory Visit: Payer: Self-pay | Admitting: Obstetrics & Gynecology

## 2019-10-04 ENCOUNTER — Other Ambulatory Visit: Payer: Self-pay

## 2019-10-04 ENCOUNTER — Encounter (HOSPITAL_COMMUNITY): Payer: Self-pay | Admitting: Psychiatry

## 2019-10-04 ENCOUNTER — Ambulatory Visit (INDEPENDENT_AMBULATORY_CARE_PROVIDER_SITE_OTHER): Payer: Medicaid Other | Admitting: Psychiatry

## 2019-10-04 DIAGNOSIS — F200 Paranoid schizophrenia: Secondary | ICD-10-CM

## 2019-10-04 DIAGNOSIS — F411 Generalized anxiety disorder: Secondary | ICD-10-CM | POA: Diagnosis not present

## 2019-10-04 MED ORDER — PALIPERIDONE ER 6 MG PO TB24: ORAL_TABLET | ORAL | 0 refills | Status: DC

## 2019-10-04 MED ORDER — HYDROXYZINE PAMOATE 25 MG PO CAPS: 25.0000 mg | ORAL_CAPSULE | Freq: Every day | ORAL | 0 refills | Status: DC | PRN

## 2019-10-04 NOTE — Progress Notes (Addendum)
Virtual Visit via Telephone Note  I connected with Lorraine Bryant on 10/04/19 at  3:20 PM EST by telephone and verified that I am speaking with the correct person using two identifiers.   I discussed the limitations, risks, security and privacy concerns of performing an evaluation and management service by telephone and the availability of in person appointments. I also discussed with the patient that there may be a patient responsible charge related to this service. The patient expressed understanding and agreed to proceed.   History of Present Illness: Patient was evaluated by phone session.  Since taking the medication as prescribed and on time she is doing much better.  She takes Invega 8 PM and then she goes to bed.  She takes hydroxyzine only as needed when she is very nervous and anxious.  She denies any negative thoughts, hallucinations or any paranoia.  She realized that she need to take the medicine on time.  She spiritually strong and believe in God when she feels anxious when she talks to God and that helps her.  She reported her mood is good.  She is getting 8 hours sleep.  She had a good support from her mother.  She denies drinking or using any illegal substances.  She has no tremors, shakes or any EPS.  Past Psychiatric History:Reviewed H/Oinpatient at [place], old [place] and [facility]. Triedtrazodone, Vistaril, Cogentin, Prolixin inj, Haldol, Abilify, olanzapine, Tegretol, Invega Sustenaand Latuda. Abilify caused restless, Latuda made nausea and olanzapine caused weight gain.   Psychiatric Specialty Exam: Physical Exam  Review of Systems  There were no vitals taken for this visit.There is no height or weight on file to calculate BMI.  General Appearance: NA  Eye Contact:  NA  Speech:  Normal Rate  Volume:  Normal  Mood:  Euthymic  Affect:  NA  Thought Process:  Goal Directed  Orientation:  Full (Time, Place, and Person)  Thought Content:  Logical  Suicidal  Thoughts:  No  Homicidal Thoughts:  No  Memory:  Immediate;   Good Recent;   Good Remote;   Good  Judgement:  Intact  Insight:  Present  Psychomotor Activity:  NA  Concentration:  Concentration: Fair and Attention Span: Fair  Recall:  Fair  Fund of Knowledge:  Good  Language:  Good  Akathisia:  No  Handed:  Right  AIMS (if indicated):     Assets:  Communication Skills Desire for Improvement Housing Resilience Social Support  ADL's:  Intact  Cognition:  WNL  Sleep:   good      Assessment and Plan: Schizophrenia chronic paranoid type.  Generalized anxiety disorder.  Patient feeling better since following sleep hygiene and taking the medication on time.  She does not want to change the medication.  Encouraged to continue Invega 6 mg at 8 PM and take hydroxyzine only as needed for anxiety.  Recommended to call us back if she is any question or any concern.  Follow-up in 3 months.  Follow Up Instructions:    I discussed the assessment and treatment plan with the patient. The patient was provided an opportunity to ask questions and all were answered. The patient agreed with the plan and demonstrated an understanding of the instructions.   The patient was advised to call back or seek an in-person evaluation if the symptoms worsen or if the condition fails to improve as anticipated.  I provided 15 minutes of non-face-to-face time during this encounter.   Cleotis Nipper, MD

## 2019-10-28 ENCOUNTER — Telehealth (HOSPITAL_COMMUNITY): Payer: Self-pay

## 2019-10-28 NOTE — Telephone Encounter (Signed)
Called patient on cell and home phone numbers to inform her that her paperwork is at the front desk for her to pick up; however, she has to pick it up and show ID at the front desk. Nobody picked up at either numbers so I left a VM message and stated that her mother cannot pick up the forms for her, the patient has to be the one to pick them up since she's an adul

## 2019-11-02 ENCOUNTER — Other Ambulatory Visit: Payer: Self-pay

## 2019-11-02 ENCOUNTER — Other Ambulatory Visit (HOSPITAL_COMMUNITY)
Admission: RE | Admit: 2019-11-02 | Discharge: 2019-11-02 | Disposition: A | Discharge status: Home / Self Care | Payer: Medicaid Other | Source: Ambulatory Visit | Attending: Student | Admitting: Student

## 2019-11-02 ENCOUNTER — Ambulatory Visit (INDEPENDENT_AMBULATORY_CARE_PROVIDER_SITE_OTHER): Payer: Self-pay | Admitting: Student

## 2019-11-02 ENCOUNTER — Encounter: Payer: Self-pay | Admitting: Student

## 2019-11-02 VITALS — BP 120/84 | Ht 64.0 in | Wt 231.0 lb

## 2019-11-02 DIAGNOSIS — Z01419 Encounter for gynecological examination (general) (routine) without abnormal findings: Secondary | ICD-10-CM | POA: Insufficient documentation

## 2019-11-02 DIAGNOSIS — N926 Irregular menstruation, unspecified: Secondary | ICD-10-CM

## 2019-11-02 LAB — POCT URINALYSIS DIP (DEVICE)
Bilirubin Urine: NEGATIVE
Glucose, UA: NEGATIVE mg/dL
Ketones, ur: NEGATIVE mg/dL
Leukocytes,Ua: NEGATIVE
Nitrite: NEGATIVE
Protein, ur: 100 mg/dL — AB
Specific Gravity, Urine: >=1.03 (ref 1.005–1.030)
Urobilinogen, UA: 0.2 mg/dL (ref 0.0–1.0)
pH: 6 (ref 5.0–8.0)

## 2019-11-02 LAB — POCT PREGNANCY, URINE: Preg Test, Ur: NEGATIVE

## 2019-11-02 NOTE — Progress Notes (Signed)
GYNECOLOGY CLINIC ANNUAL PREVENTATIVE CARE ENCOUNTER NOTE  Subjective:   Lorraine Bryant is a 34 y.o. G0P0000 female here for a routine annual gynecologic exam.  Current complaints: irregular cycles.  She is accompanied today by her mother who assisted with history taking.  She is taking paliperidone due to schizophrenia & has elevated prolactin & galactorrhea. Is followed by psychiatrist, endocrinologist, and PCP.   Reports that for the past year she has had irregularity with her menstrual cycles. Skips months, and thinks at one time she went >6 months without a bleeding episode. When she does have a cycle it is just a few days of spotting. Unsure if previous cycles were heavier than spotting or if it is just a change in frequency. Also has had pelvic pressure with her cycles. Does not track her bleeding episodes so was difficult to determine the irregularity. Last bleeding episode was 10/01/19. She reports that she has never been sexually active. Currently not on contraception.  Last saw her PCP within the last year and has appointment coming up within the next 2 months.   Gynecologic History No LMP recorded. (Menstrual status: Irregular Periods). Contraception: abstinence Last Pap: unknown. Results were: normal per patient & her mother, but don't know when it was done Last mammogram: n/a.   Obstetric History OB History  Gravida Para Term Preterm AB Living  0 0 0 0 0 0  SAB TAB Ectopic Multiple Live Births  0 0 0 0 0    Past Medical History:  Diagnosis Date  . Depression   . 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 Cleared by endocrinology per patient.   . Hallucinations   . Paranoid schizophrenia (HCC) 07/15/2017  . Psychosis (HCC)    per pt and family last year    Past Surgical History:  Procedure Laterality Date  . NO PAST SURGERIES      Current Outpatient Medications on File Prior to Visit  Medication Sig Dispense Refill  .  hydrOXYzine (VISTARIL) 25 MG capsule Take 1 capsule (25 mg total) by mouth daily as needed for anxiety. 30 capsule 0  . paliperidone (INVEGA) 6 MG 24 hr tablet Take one tab at 8 PM. 90 tablet 0  . cyclobenzaprine (FLEXERIL) 10 MG tablet Take 1 tablet (10 mg total) by mouth 2 (two) times daily as needed for muscle spasms. (Patient not taking: Reported on 11/02/2019) 20 tablet 0   No current facility-administered medications on file prior to visit.    Allergies  Allergen Reactions  . Abilify [Aripiprazole] Other (See Comments)    Slow moving and hand cramping up    Social History   Socioeconomic History  . Marital status: Single    Spouse name: Not on file  . Number of children: Not on file  . Years of education: Not on file  . Highest education level: Not on file  Occupational History  . Not on file  Tobacco Use  . Smoking status: Never Smoker  . Smokeless tobacco: Never Used  Substance and Sexual Activity  . Alcohol use: No  . Drug use: No    Comment: unable to assess d/t pt not responding to questioning  . Sexual activity: Never  Other Topics Concern  . Not on file  Social History Narrative  . Not on file   Social Determinants of Health   Financial Resource Strain:   . Difficulty of Paying Living Expenses:   Food Insecurity:   . Worried About Running  Out of Food in the Last Year:   . [place] in the Last Year:   Transportation Needs:   . Lack of Transportation (Medical):   Marland Kitchen Lack of Transportation (Non-Medical):   Physical Activity:   . Days of Exercise per Week:   . Minutes of Exercise per Session:   Stress:   . Feeling of Stress :   Social Connections:   . Frequency of Communication with Friends and Family:   . Frequency of Social Gatherings with Friends and Family:   . Attends Religious Services:   . Active Member of Clubs or Organizations:   . Attends Archivist Meetings:   Marland Kitchen Marital Status:   Intimate Partner Violence:   . Fear of  Current or Ex-Partner:   . Emotionally Abused:   Marland Kitchen Physically Abused:   . Sexually Abused:     Family History  Problem Relation Age of Onset  . Drug abuse Father   . Schizophrenia Maternal Aunt     The following portions of the patient's history were reviewed and updated as appropriate: allergies, current medications, past family history, past medical history, past social history, past surgical history and problem list.  Review of Systems Pertinent items are noted in HPI.   Objective:  BP 120/84   Ht 5\' 4"  (1.626 m)   Wt 231 lb (104.8 kg)   BMI 39.65 kg/m  CONSTITUTIONAL: Well-developed, well-nourished female in no acute distress.  HENT:  Normocephalic, atraumatic, External right and left ear normal. Oropharynx is clear and moist EYES: Conjunctivae and EOM are normal. Pupils are equal, round, and reactive to light. No scleral icterus.  NECK: Normal range of motion, supple, no masses.  Normal thyroid.  SKIN: Skin is warm and dry. No rash noted. Not diaphoretic. No erythema. No pallor. [place]: Alert and oriented to person, place, and time. Normal reflexes, muscle tone coordination. No cranial nerve deficit noted. PSYCHIATRIC: Normal behavior although slow to respond at times. CARDIOVASCULAR: Normal heart rate noted, regular rhythm RESPIRATORY: Clear to auscultation bilaterally. Effort and breath sounds normal, no problems with respiration noted. BREASTS: Symmetric in size. No masses, skin changes, nipple drainage, or lymphadenopathy. ABDOMEN: Soft, normal bowel sounds, no distention noted.  No tenderness, rebound or guarding.  PELVIC: Normal appearing external genitalia; normal appearing vaginal mucosa and cervix.  No abnormal discharge noted.  Pap smear obtained.  Normal uterine size, no other palpable masses, no uterine or adnexal tenderness. MUSCULOSKELETAL: Normal range of motion. No tenderness.  No cyanosis, clubbing, or edema.  2+ distal pulses.   Assessment:  Annual  gynecologic examination with pap smear   Plan:  1. Encounter for well woman exam with routine gynecological exam -normal well woman exam today. Will contact patient with pap smear results. Pt has close follow up with her PCP & psychiatrist. She declines STD testing today. She declines contraception due to abstinence.   - Cytology - PAP( [facility]) - CBC - Hemoglobin A1c - Testosterone - TSH - Prolactin  2. Irregular menses -oligomenorrhea, labs ordered. Outpatient ultrasound ordered per patient & mother's request. Will have her f/u in a month with MD to discuss results.   **Pt initially agreeable to labs but refused to let phlebotomist draw her. Unable to discontinue labs from epic at this time, but they were not collected. Will continue with outpatient ultrasound & f/u**  - Hemoglobin A1c - Testosterone - TSH - Prolactin - FSH - US PELVIC COMPLETE WITH TRANSVAGINAL; Future  Judeth Horn, NP

## 2019-11-02 NOTE — Patient Instructions (Addendum)
Abnormal Uterine Bleeding °Abnormal uterine bleeding is unusual bleeding from the uterus. It includes: °· Bleeding or spotting between periods. °· Bleeding after sex. °· Bleeding that is heavier than normal. °· Periods that last longer than usual. °· Bleeding after menopause. °Abnormal uterine bleeding can affect women at various stages in life, including teenagers, women in their reproductive years, pregnant women, and women who have reached menopause. Common causes of abnormal uterine bleeding include: °· Pregnancy. °· Growths of tissue (polyps). °· A noncancerous tumor in the uterus (fibroid). °· Infection. °· Cancer. °· Hormonal imbalances. °Any type of abnormal bleeding should be evaluated by a health care provider. Many cases are minor and simple to treat, while others are more serious. Treatment will depend on the cause of the bleeding. °Follow these instructions at home: °· Monitor your condition for any changes. °· Do not use tampons, douche, or have sex if told by your health care provider. °· Change your pads often. °· Get regular exams that include pelvic exams and cervical cancer screening. °· Keep all follow-up visits as told by your health care provider. This is important. °Contact a health care provider if: °· Your bleeding lasts for more than one week. °· You feel dizzy at times. °· You feel nauseous or you vomit. °Get help right away if: °· You pass out. °· Your bleeding soaks through a pad every hour. °· You have abdominal pain. °· You have a fever. °· You become sweaty or weak. °· You pass large blood clots from your vagina. °Summary °· Abnormal uterine bleeding is unusual bleeding from the uterus. °· Any type of abnormal bleeding should be evaluated by a health care provider. Many cases are minor and simple to treat, while others are more serious. °· Treatment will depend on the cause of the bleeding. °This information is not intended to replace advice given to you by your health care provider.  Make sure you discuss any questions you have with your health care provider. °Document Revised: 11/12/2017 Document Reviewed: 09/06/2016 °Elsevier Patient Education © 2020 [employer] Inc[employer] ° °

## 2019-11-03 LAB — CYTOLOGY - PAP
Adequacy: ABSENT
Comment: NEGATIVE
Diagnosis: NEGATIVE
High risk HPV: NEGATIVE

## 2019-11-05 ENCOUNTER — Encounter (HOSPITAL_COMMUNITY): Payer: Self-pay | Admitting: Psychiatry

## 2019-11-05 ENCOUNTER — Ambulatory Visit (INDEPENDENT_AMBULATORY_CARE_PROVIDER_SITE_OTHER): Payer: Medicaid Other | Admitting: Psychiatry

## 2019-11-05 ENCOUNTER — Other Ambulatory Visit: Payer: Self-pay

## 2019-11-05 DIAGNOSIS — F411 Generalized anxiety disorder: Secondary | ICD-10-CM

## 2019-11-05 DIAGNOSIS — F200 Paranoid schizophrenia: Secondary | ICD-10-CM

## 2019-11-05 MED ORDER — PALIPERIDONE ER 9 MG PO TB24: ORAL_TABLET | ORAL | 0 refills | Status: DC

## 2019-11-05 MED ORDER — HYDROXYZINE PAMOATE 25 MG PO CAPS: 25.0000 mg | ORAL_CAPSULE | Freq: Every day | ORAL | 0 refills | Status: DC

## 2019-11-05 NOTE — Progress Notes (Signed)
Virtual Visit via Telephone Note  I connected with [name] on 11/05/19 at 11:20 AM EDT by telephone and verified that I am speaking with the correct person using two identifiers.   I discussed the limitations, risks, security and privacy concerns of performing an evaluation and management service by telephone and the availability of in person appointments. I also discussed with the patient that there may be a patient responsible charge related to this service. The patient expressed understanding and agreed to proceed.   History of Present Illness: Patient was evaluated by phone.  She requested earlier appointment as patient not doing well.  Her mother was also close by.  Apparently she has been experiencing increased anxiety, crying spells and having mood swings.  She also having delusions that she may have pregnant even though she is not sexually active.  She went to see her OB/GYN and she was told that she is not pregnant.  Patient told that she was feeling heaviness in her pelvis and she was not sure.  She admitted some time hearing voices these are God voices.  She is taking Invega but there are some days when she missed the dose.  In the past she was taking injection and that did not work very well per patient is not interested to go back to injection.  She endorses getting some time easily upset.  She takes hydroxyzine only when she is very nervous.  She had a good support from her mother.  She denies any suicidal thoughts or homicidal thought but reported sometimes feel very sad, hopeless.  Her energy level is fair.  Her appetite is okay.  She has no tremors, shakes or any EPS.  Past Psychiatric History:Reviewed H/Oinpatient at [place], old [place] and [facility]. Triedtrazodone, Vistaril, Cogentin, Prolixin inj, Haldol, Abilify, olanzapine, Tegretol, Invega Sustenaand Latuda. Abilify caused restless, Latuda made nausea and olanzapine caused weight gain.   Psychiatric  Specialty Exam: Physical Exam  Review of Systems  There were no vitals taken for this visit.There is no height or weight on file to calculate BMI.  General Appearance: NA  Eye Contact:  NA  Speech:  Slow  Volume:  Decreased  Mood:  Anxious and Depressed  Affect:  NA  Thought Process:  Descriptions of Associations: Intact  Orientation:  Full (Time, Place, and Person)  Thought Content:  Delusions, Hallucinations: Auditory some times believe she is pregnant. Hear voices og God and Paranoid Ideation  Suicidal Thoughts:  No  Homicidal Thoughts:  No  Memory:  Immediate;   Good Recent;   Fair Remote;   Fair  Judgement:  Fair  Insight:  Fair  Psychomotor Activity:  NA  Concentration:  Concentration: Fair and Attention Span: Fair  Recall:  [employer] of Knowledge:  Fair  Language:  Good  Akathisia:  No  Handed:  Right  AIMS (if indicated):     Assets:  Communication Skills Desire for Improvement Housing Resilience Social Support  ADL's:  Intact  Cognition:  WNL  Sleep:   4 hrs      Assessment and Plan: Schizophrenia chronic paranoid type.  Generalized anxiety disorder.  Patient is slowly decompensating.  I reinforced that she should take the medicine every night since there are days that she skipped the doses.  I also suggested to go back to take injection since she did very well but patient is not interested at this time.  She reported that she does not trust government even though explained is the same  chemical that she is getting injection.  She is open to try higher dose of Invega.  We will trial 9 mg at bedtime and I recommend to take hydroxyzine 25 mg every night to help her sleep.  Discussed medication side effects and benefits.  If higher dose of Invega did not help then she will consider injection.  Discussed medication side effects and benefits.  Discussed safety concerns and anytime having active suicidal thoughts or homicidal thought then she need to call 911 or go to  local emergency room.  Follow-up in 3 weeks.    Follow Up Instructions:    I discussed the assessment and treatment plan with the patient. The patient was provided an opportunity to ask questions and all were answered. The patient agreed with the plan and demonstrated an understanding of the instructions.   The patient was advised to call back or seek an in-person evaluation if the symptoms worsen or if the condition fails to improve as anticipated.  I provided 20 minutes of non-face-to-face time during this encounter.   Kathlee Nations, MD

## 2019-11-09 ENCOUNTER — Ambulatory Visit (HOSPITAL_COMMUNITY): Admission: RE | Admit: 2019-11-09 | Payer: Medicaid Other | Source: Ambulatory Visit

## 2019-11-16 ENCOUNTER — Ambulatory Visit: Payer: Medicaid Other | Admitting: Obstetrics & Gynecology

## 2019-11-16 ENCOUNTER — Other Ambulatory Visit: Payer: Self-pay

## 2019-11-24 ENCOUNTER — Ambulatory Visit: Payer: Medicaid Other | Admitting: Obstetrics and Gynecology

## 2019-11-24 ENCOUNTER — Ambulatory Visit: Payer: Medicaid Other | Admitting: Obstetrics & Gynecology

## 2019-11-29 ENCOUNTER — Other Ambulatory Visit: Payer: Self-pay | Admitting: Student

## 2019-11-29 ENCOUNTER — Other Ambulatory Visit: Payer: Self-pay

## 2019-11-29 ENCOUNTER — Ambulatory Visit (HOSPITAL_COMMUNITY)
Admission: RE | Admit: 2019-11-29 | Discharge: 2019-11-29 | Disposition: A | Discharge status: Home / Self Care | Payer: Medicaid Other | Source: Ambulatory Visit | Attending: Student | Admitting: Student

## 2019-11-29 DIAGNOSIS — N926 Irregular menstruation, unspecified: Secondary | ICD-10-CM

## 2019-11-30 ENCOUNTER — Encounter (HOSPITAL_COMMUNITY): Payer: Self-pay | Admitting: Psychiatry

## 2019-11-30 ENCOUNTER — Ambulatory Visit (INDEPENDENT_AMBULATORY_CARE_PROVIDER_SITE_OTHER): Payer: Medicaid Other | Admitting: Psychiatry

## 2019-11-30 DIAGNOSIS — F200 Paranoid schizophrenia: Secondary | ICD-10-CM | POA: Diagnosis not present

## 2019-11-30 DIAGNOSIS — F419 Anxiety disorder, unspecified: Secondary | ICD-10-CM

## 2019-11-30 MED ORDER — HYDROXYZINE PAMOATE 25 MG PO CAPS: 25.0000 mg | ORAL_CAPSULE | Freq: Every day | ORAL | 0 refills | Status: DC

## 2019-11-30 MED ORDER — PALIPERIDONE ER 9 MG PO TB24: ORAL_TABLET | ORAL | 0 refills | Status: DC

## 2019-11-30 NOTE — Progress Notes (Signed)
Virtual Visit via Telephone Note  I connected with Lorraine Bryant on 11/30/19 at  1:40 PM EDT by telephone and verified that I am speaking with the correct person using two identifiers.   I discussed the limitations, risks, security and privacy concerns of performing an evaluation and management service by telephone and the availability of in person appointments. I also discussed with the patient that there may be a patient responsible charge related to this service. The patient expressed understanding and agreed to proceed.   History of Present Illness: Patient was evaluated by phone. On the last visit we increase Invega 9 mg. She noticed improvement in her mood, irritability, paranoia and hallucination. She still have some time negative thoughts but she talks to God and that helps her. However lately she does not have any paranoia or delusions. Her mother endorsed that she did go to sleep very late and wakes up late. She does not take hydroxyzine every night. But increase Invega help her crying spells and mood swings. She has no more delusions about pregnancy but she had in the past. She is tolerating her medication and reported no side effects. Her appetite is okay. She has no EPS or tremors.   Past Psychiatric History:Reviewed H/Oinpatient at [facility], old [facility].Triedtrazodone, Vistaril, Cogentin, Prolixin inj, Haldol, Abilify, olanzapine, Tegretol, Invega Sustenaand Latuda. Abilifycausedrestless, Latudamadenausea and olanzapine caused weight gain.  Psychiatric Specialty Exam: Physical Exam  Review of Systems  There were no vitals taken for this visit.There is no height or weight on file to calculate BMI.  General Appearance: NA  Eye Contact:  NA  Speech:  Slow  Volume:  Decreased  Mood:  Anxious  Affect:  NA  Thought Process:  Descriptions of Associations: Intact  Orientation:  Full (Time, Place, and Person)  Thought Content:  Rumination  Suicidal  Thoughts:  No  Homicidal Thoughts:  No  Memory:  Immediate;   Good Recent;   Fair Remote;   Fair  Judgement:  Fair  Insight:  Fair  Psychomotor Activity:  NA  Concentration:  Concentration: Fair and Attention Span: Fair  Recall:  Good  Fund of Knowledge:  Fair  Language:  Good  Akathisia:  No  Handed:  Right  AIMS (if indicated):     Assets:  Communication Skills Desire for Improvement Housing Social Support  ADL's:  Intact  Cognition:  WNL  Sleep:   better      Assessment and Plan: Schizophrenia chronic paranoid type. Anxiety  Patient doing better on Invega 9 mg. I encouraged to take the hydroxyzine every night 25 mg at bedtime. Discussed sleep hygiene that try to go to bed early and after taking the medication avoid doing things to keep her up. She agree with the plan. I will continue Invega 9 mg daily and hydroxyzine 25 mg every night for anxiety and sleep. Recommended to call us back if she has any question or any concern. Follow-up in 3 months.  Follow Up Instructions:    I discussed the assessment and treatment plan with the patient. The patient was provided an opportunity to ask questions and all were answered. The patient agreed with the plan and demonstrated an understanding of the instructions.   The patient was advised to call back or seek an in-person evaluation if the symptoms worsen or if the condition fails to improve as anticipated.  I provided 20 minutes of non-face-to-face time during this encounter.   Cleotis Nipper, MD

## 2019-12-20 ENCOUNTER — Other Ambulatory Visit: Payer: Self-pay

## 2019-12-20 ENCOUNTER — Ambulatory Visit (INDEPENDENT_AMBULATORY_CARE_PROVIDER_SITE_OTHER): Payer: Medicaid Other | Admitting: Obstetrics & Gynecology

## 2019-12-20 ENCOUNTER — Encounter: Payer: Self-pay | Admitting: Obstetrics & Gynecology

## 2019-12-20 VITALS — BP 109/61 | HR 70 | Temp 98.4°F | Wt 228.0 lb

## 2019-12-20 DIAGNOSIS — N914 Secondary oligomenorrhea: Secondary | ICD-10-CM | POA: Diagnosis not present

## 2019-12-20 DIAGNOSIS — N643 Galactorrhea not associated with childbirth: Secondary | ICD-10-CM

## 2019-12-20 DIAGNOSIS — R7989 Other specified abnormal findings of blood chemistry: Secondary | ICD-10-CM | POA: Diagnosis not present

## 2019-12-20 DIAGNOSIS — E221 Hyperprolactinemia: Secondary | ICD-10-CM | POA: Diagnosis not present

## 2019-12-20 NOTE — Progress Notes (Signed)
GYNECOLOGY OFFICE VISIT NOTE  History:   Lorraine Bryant is a 34 y.o. G0 here today for discussion of results of oligomenorrhea evaluation. Was ordered for pelvic ultrasound and labs during last encounter, she declined labs. She is here with her mother. 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 Has been seen by Endocrinology in past, and was cleared as the high prolactin levels are drug induced. Gets occasional galactorrhea and oligomenorrhea, skips months but most of the times has some spotting.  She denies any current abnormal vaginal discharge, bleeding, pelvic pain or other concerns.    Past Medical History:  Diagnosis Date  . Depression   . 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 Cleared by endocrinology per patient.   . Hallucinations   . Paranoid schizophrenia (HCC) 07/15/2017  . Psychosis (HCC)    per pt and family last year    Past Surgical History:  Procedure Laterality Date  . NO PAST SURGERIES     The following portions of the patient's history were reviewed and updated as appropriate: allergies, current medications, past family history, past medical history, past social history, past surgical history and problem list.   Health Maintenance:  Normal pap and negative HRHPV on 11/02/2019.   Review of Systems:  Pertinent items noted in HPI and remainder of comprehensive ROS otherwise negative.  Physical Exam:  BP 109/61   Pulse 70   Temp 98.4 F (36.9 C)   Wt 228 lb (103.4 kg)   LMP 12/18/2019   BMI 39.14 kg/m  CONSTITUTIONAL: Well-developed, well-nourished female in no acute distress.  HEENT:  Normocephalic, atraumatic. External right and left ear normal. No scleral icterus.  NECK: Normal range of motion, supple, no masses noted on observation SKIN: No rash noted. Not diaphoretic. No erythema. No pallor. MUSCULOSKELETAL: Normal range of motion. No edema  noted. NEUROLOGIC: Alert and oriented to person, place, and time. Normal muscle tone coordination. No cranial nerve deficit noted. PSYCHIATRIC: Normal mood and affect. Normal behavior. Normal judgment and thought content. CARDIOVASCULAR: Normal heart rate noted RESPIRATORY: Effort and breath sounds normal, no problems with respiration noted ABDOMEN: No masses noted. No other overt distention noted.   PELVIC: Deferred  Labs and Imaging  US PELVIC COMPLETE WITH TRANSVAGINAL  Result Date: 11/29/2019 CLINICAL DATA:  Irregular menses, pelvic pressure; LMP = 11/01/2019 EXAM: TRANSABDOMINAL AND TRANSVAGINAL ULTRASOUND OF PELVIS TECHNIQUE: Both transabdominal and transvaginal ultrasound examinations of the pelvis were performed. Transabdominal technique was performed for global imaging of the pelvis including uterus, ovaries, adnexal regions, and pelvic cul-de-sac. It was necessary to proceed with endovaginal exam following the transabdominal exam to visualize the endometrium. COMPARISON:  07/02/2017 FINDINGS: Uterus Measurements: 9.1 x 3.7 x 5.3 cm = volume: 93 mL. Anteverted. Heterogeneous myometrium. Small subserosal leiomyoma at posterior upper uterus 2.1 x 1.4 x 2.0 cm. Endometrium Thickness: 6 mm.  No endometrial fluid or focal abnormality Right ovary Measurements: 3.1 x 1.4 x 2.3 cm = volume: 5.5 mL. Normal morphology without mass. Internal blood flow present on color Doppler imaging. Left ovary Measurements: 3.0 x 1.9 x 1.4 cm = volume: 4.0 mL. Normal morphology without mass. Internal blood flow present on color Doppler imaging. Other findings No free pelvic fluid.  No adnexal masses. IMPRESSION: Small subserosal leiomyoma at posterior upper uterus 2.1 cm greatest size. Remainder of exam normal. Electronically Signed   By: Ulyses Southward M.D.   On: 11/29/2019 15:10  Assessment and Plan:      1. Elevated prolactin level 2. Galactorrhoea with hyperprolactinemia ([place]) 3. Secondary  oligomenorrhea Reviewed ultrasound findings, reassured that fibroid is not causing her oligomenorrhea (usually fibroids cause heavy bleeding).  Advised to recheck labs, see what her prolactin level is (was 209.4 on 09/04/18).  She wanted to discuss the effects of elevated prolactin in general, I deferred to the expertise of Endocrinology. She was told that oligomenorrhea was a known side effect of high prolactin, no acute intervention is needed for this.  - Ambulatory referral to Endocrinology  Routine preventative health maintenance measures emphasized. Please refer to After Visit Summary for other counseling recommendations.   Return for any gynecologic concerns.    Total face-to-face time with patient: 17 minutes.  Over 50% of encounter was spent on counseling and coordination of care.   Verita Schneiders, MD, [place] for [employer], [place]

## 2019-12-28 ENCOUNTER — Encounter (HOSPITAL_COMMUNITY): Payer: Self-pay | Admitting: *Deleted

## 2019-12-28 ENCOUNTER — Telehealth (HOSPITAL_COMMUNITY): Payer: Self-pay

## 2019-12-28 NOTE — Telephone Encounter (Signed)
For court? Or job?

## 2019-12-28 NOTE — Telephone Encounter (Signed)
Patient had called requesting a letter. I called her back to get more information and she said that she would like a letter stating that she has been under your care since March 2019 to present and that she couldn't attend school due to her condition. Do you want me to write this letter? Please review and advise. Thank you

## 2019-12-28 NOTE — Telephone Encounter (Signed)
We can provide letter stating that she has chronic mental disorder and she takes medication.

## 2020-01-03 ENCOUNTER — Ambulatory Visit (HOSPITAL_COMMUNITY): Payer: Medicaid Other | Admitting: Psychiatry

## 2020-01-10 ENCOUNTER — Encounter (HOSPITAL_COMMUNITY): Payer: Self-pay

## 2020-01-10 ENCOUNTER — Other Ambulatory Visit (HOSPITAL_COMMUNITY): Payer: Self-pay | Admitting: Psychiatry

## 2020-01-10 DIAGNOSIS — F200 Paranoid schizophrenia: Secondary | ICD-10-CM

## 2020-01-10 NOTE — Telephone Encounter (Signed)
Neither. Letter is done and being mailed out to patient

## 2020-02-20 ENCOUNTER — Other Ambulatory Visit (HOSPITAL_COMMUNITY): Payer: Self-pay | Admitting: Psychiatry

## 2020-02-20 DIAGNOSIS — F419 Anxiety disorder, unspecified: Secondary | ICD-10-CM

## 2020-02-23 ENCOUNTER — Encounter (HOSPITAL_COMMUNITY): Payer: Self-pay | Admitting: Psychiatry

## 2020-02-23 ENCOUNTER — Telehealth (INDEPENDENT_AMBULATORY_CARE_PROVIDER_SITE_OTHER): Payer: Medicaid Other | Admitting: Psychiatry

## 2020-02-23 ENCOUNTER — Other Ambulatory Visit: Payer: Self-pay

## 2020-02-23 DIAGNOSIS — F200 Paranoid schizophrenia: Secondary | ICD-10-CM | POA: Diagnosis not present

## 2020-02-23 DIAGNOSIS — F419 Anxiety disorder, unspecified: Secondary | ICD-10-CM | POA: Diagnosis not present

## 2020-02-23 MED ORDER — PALIPERIDONE ER 9 MG PO TB24: ORAL_TABLET | ORAL | 0 refills | Status: DC

## 2020-02-23 MED ORDER — HYDROXYZINE PAMOATE 25 MG PO CAPS: 25.0000 mg | ORAL_CAPSULE | Freq: Every day | ORAL | 0 refills | Status: DC

## 2020-02-23 NOTE — Progress Notes (Signed)
Virtual Visit via Telephone Note  I connected with Lorraine Bryant on 02/23/20 at  1:40 PM EDT by telephone and verified that I am speaking with the correct person using two identifiers.  Location: Patient: In Car Provider: Home Office   I discussed the limitations, risks, security and privacy concerns of performing an evaluation and management service by telephone and the availability of in person appointments. I also discussed with the patient that there may be a patient responsible charge related to this service. The patient expressed understanding and agreed to proceed.   History of Present Illness: Patient is evaluated by phone.  She is taking Invega 9 mg every night and she feels her sleep is good.  She denies any paranoia, hallucination or any suicidal thoughts.  Today she is going to [place] to [place] her friend who is visiting from [place].  After that she had a plan to eat out there.  She feels the current medicine working and she denies any anger, mania, psychosis.  She denies any crying spells.  She is trying to lose weight and she had lost few pounds since the last visit.  She has no tremor shakes or any EPS.  She like to keep her current medication.    Past Psychiatric History:Reviewed H/Oinpatient at [facility], old [facility].Triedtrazodone, Vistaril, Cogentin, Prolixin inj, Haldol, Abilify, olanzapine, Tegretol, Invega Sustenaand Latuda. Abilifycausedrestless, Latudamadenausea and olanzapine caused weight gain.  Psychiatric Specialty Exam: Physical Exam  Review of Systems  There were no vitals taken for this visit.There is no height or weight on file to calculate BMI.  General Appearance: NA  Eye Contact:  NA  Speech:  Clear and Coherent  Volume:  Normal  Mood:  happy  Affect:  NA  Thought Process:  Descriptions of Associations: Intact  Orientation:  Full (Time, Place, and Person)  Thought Content:  Logical  Suicidal Thoughts:  No   Homicidal Thoughts:  No  Memory:  Immediate;   Fair Recent;   Fair Remote;   Fair  Judgement:  Intact  Insight:  Present  Psychomotor Activity:  NA  Concentration:  Concentration: Fair and Attention Span: Fair  Recall:  [employer] of Knowledge:  Fair  Language:  Good  Akathisia:  No  Handed:  Right  AIMS (if indicated):     Assets:  Communication Skills Desire for Improvement Housing Resilience Social Support  ADL's:  Intact  Cognition:  WNL  Sleep:   good      Assessment and Plan: Schizophrenia chronic paranoid type.  Anxiety.  Patient is a stable on her current medication.  We will continue Invega 9 mg daily hydroxyzine 25 mg at bedtime.  Discussed medication side effects and benefits.  Recommended to call us back if she has any question, concern or if she feels worsening of the symptom.  Follow-up in 3 months.  Follow Up Instructions:    I discussed the assessment and treatment plan with the patient. The patient was provided an opportunity to ask questions and all were answered. The patient agreed with the plan and demonstrated an understanding of the instructions.   The patient was advised to call back or seek an in-person evaluation if the symptoms worsen or if the condition fails to improve as anticipated.  I provided 15 minutes of non-face-to-face time during this encounter.   Cleotis Nipper, MD

## 2020-05-25 ENCOUNTER — Encounter (HOSPITAL_COMMUNITY): Payer: Self-pay | Admitting: Psychiatry

## 2020-05-25 ENCOUNTER — Other Ambulatory Visit: Payer: Self-pay

## 2020-05-25 ENCOUNTER — Telehealth (INDEPENDENT_AMBULATORY_CARE_PROVIDER_SITE_OTHER): Payer: Medicaid Other | Admitting: Psychiatry

## 2020-05-25 DIAGNOSIS — F419 Anxiety disorder, unspecified: Secondary | ICD-10-CM

## 2020-05-25 DIAGNOSIS — F200 Paranoid schizophrenia: Secondary | ICD-10-CM

## 2020-05-25 MED ORDER — HYDROXYZINE PAMOATE 25 MG PO CAPS: 25.0000 mg | ORAL_CAPSULE | Freq: Every evening | ORAL | 0 refills | Status: DC | PRN

## 2020-05-25 MED ORDER — PALIPERIDONE ER 9 MG PO TB24: ORAL_TABLET | ORAL | 0 refills | Status: DC

## 2020-05-25 NOTE — Progress Notes (Signed)
Virtual Visit via Telephone Note  I connected with Lorraine Bryant on 05/25/20 at  2:00 PM EDT by telephone and verified that I am speaking with the correct person using two identifiers.  Location: Patient: home Provider: home office   I discussed the limitations, risks, security and privacy concerns of performing an evaluation and management service by telephone and the availability of in person appointments. I also discussed with the patient that there may be a patient responsible charge related to this service. The patient expressed understanding and agreed to proceed.   History of Present Illness: Patient is evaluated by phone session.  She is compliant with Invega and takes hydroxyzine 2-3 times a week.  She is doing well.  She started watching her calorie intake and she lost 10 pounds since the last visit.  She had a good summer.  She went to [place] to visit her family members and she had a good time.  She is sleeping good.  She denies any mania, psychosis or any hallucination.  Sometimes he talks to God and that helps her a lot.  I also spoke to her mother who reported that patient is doing well.  There has been no recent anger or irritability.  She has no tremors shakes or any EPS.  Patient denies drinking or using any illegal substances.  Past Psychiatric History: H/[facility]inpatient at [facility], old [facility].Triedtrazodone, Vistaril, Cogentin, Prolixin inj, Haldol, Abilify, olanzapine, Tegretol, Invega Sustenaand Latuda. Abilifycausedrestless, Latudamadenausea and olanzapine caused weight gain.   Psychiatric Specialty Exam: Physical Exam  Review of Systems  Weight 210 lb (95.3 kg).There is no height or weight on file to calculate BMI.  General Appearance: NA  Eye Contact:  NA  Speech:  Clear and Coherent  Volume:  Normal  Mood:  Euthymic  Affect:  NA  Thought Process:  Descriptions of Associations: Intact  Orientation:  Full (Time, Place, and Person)   Thought Content:  WDL  Suicidal Thoughts:  No  Homicidal Thoughts:  No  Memory:  Immediate;   Fair Recent;   Fair Remote;   Fair  Judgement:  Intact  Insight:  Present  Psychomotor Activity:  NA  Concentration:  Concentration: Fair and Attention Span: Fair  Recall:  Good  Fund of Knowledge:  Good  Language:  Good  Akathisia:  No  Handed:  Right  AIMS (if indicated):     Assets:  Communication Skills Desire for Improvement Housing Resilience Social Support  ADL's:  Intact  Cognition:  WNL  Sleep:   good      Assessment and Plan: Schizophrenia chronic paranoid type.  Anxiety.  Patient doing well on her medication.  Continue Invega 9 mg at bedtime and hydroxyzine 25 mg as needed for insomnia and anxiety.  Discussed medication side effects and benefits.  Encouraged healthy life styles and watch her calorie intake.  She is trying to lose weight and so far she had lost 10 pounds since the last visit.  Recommended to call us back if she has any question or any concern.  Follow-up in 3 months.  Follow Up Instructions:    I discussed the assessment and treatment plan with the patient. The patient was provided an opportunity to ask questions and all were answered. The patient agreed with the plan and demonstrated an understanding of the instructions.   The patient was advised to call back or seek an in-person evaluation if the symptoms worsen or if the condition fails to improve as anticipated.  I  provided 20 minutes of non-face-to-face time during this encounter.   Cleotis Nipper, MD

## 2020-08-24 ENCOUNTER — Encounter (HOSPITAL_COMMUNITY): Payer: Self-pay | Admitting: Psychiatry

## 2020-08-24 ENCOUNTER — Other Ambulatory Visit: Payer: Self-pay

## 2020-08-24 ENCOUNTER — Telehealth (INDEPENDENT_AMBULATORY_CARE_PROVIDER_SITE_OTHER): Payer: Medicaid Other | Admitting: Psychiatry

## 2020-08-24 DIAGNOSIS — F419 Anxiety disorder, unspecified: Secondary | ICD-10-CM | POA: Diagnosis not present

## 2020-08-24 DIAGNOSIS — F200 Paranoid schizophrenia: Secondary | ICD-10-CM

## 2020-08-24 MED ORDER — PALIPERIDONE ER 9 MG PO TB24: ORAL_TABLET | ORAL | 0 refills | Status: DC

## 2020-08-24 MED ORDER — HYDROXYZINE PAMOATE 25 MG PO CAPS: 25.0000 mg | ORAL_CAPSULE | Freq: Every evening | ORAL | 0 refills | Status: DC | PRN

## 2020-08-24 NOTE — Progress Notes (Signed)
Virtual Visit via Telephone Note  I connected with Lorraine Bryant on 08/24/20 at  2:00 PM EST by telephone and verified that I am speaking with the correct person using two identifiers.  Location: Patient: Doctors lobby  Provider: Home Office   I discussed the limitations, risks, security and privacy concerns of performing an evaluation and management service by telephone and the availability of in person appointments. I also discussed with the patient that there may be a patient responsible charge related to this service. The patient expressed understanding and agreed to proceed.   History of Present Illness: Patient is evaluated by phone session.  Usually her mother is available but today her mother is at doctor's office and patient was sitting in the office lobby.  Patient told that she has been doing well but occasionally she has irritability, racing thoughts, poor sleep.  She also noticed mild tremors in her hand.  She feels her thinking is much clearer and she denies any mania, psychosis or any hallucination.  She still talks to GOD when she is anxious and that helps her a lot.  She is not taking hydroxyzine every night.  She feels Invega helping her psychosis.  She denies drinking or using any illegal substances.  She had a good Christmas and she had a good time with the family.  Her appetite is okay.  Her weight is stable.  Patient also mentioned that she received a form that need to be filled for continuation of her disability.  Patient is not working and she is on disability.     Past Psychiatric History: H/[facility]inpatient at [facility], old [facility].Triedtrazodone, Vistaril, Cogentin, Prolixin inj, Haldol, Abilify, olanzapine, Tegretol, Invega Sustenaand Latuda. Abilifycausedrestless, Latudamadenausea and olanzapine caused weight gain.  Psychiatric Specialty Exam: Physical Exam  Review of Systems  Weight 210 lb (95.3 kg).There is no height or weight on file to  calculate BMI.  General Appearance: NA  Eye Contact:  NA  Speech:  Clear and Coherent and Slow  Volume:  Normal  Mood:  Euthymic  Affect:  NA  Thought Process:  Descriptions of Associations: Intact  Orientation:  Full (Time, Place, and Person)  Thought Content:  WDL  Suicidal Thoughts:  No  Homicidal Thoughts:  No  Memory:  Immediate;   Fair Recent;   Fair Remote;   Fair  Judgement:  Intact  Insight:  Present  Psychomotor Activity:  NA  Concentration:  Concentration: Fair and Attention Span: Fair  Recall:  Good  Fund of Knowledge:  Fair  Language:  Fair  Akathisia:  mild tremors  Handed:  Right  AIMS (if indicated):     Assets:  Communication Skills Desire for Improvement Housing Resilience Social Support Transportation  ADL's:  Intact  Cognition:  WNL  Sleep:   fair      Assessment and Plan: Schizophrenia chronic paranoid type.  Anxiety.  Discuss residual irritability, anxiety, tremors and insomnia.  Recommended to take the hydroxyzine every night to help and tolerate the symptoms.  She agreed with the plan.  Patient will drop the forms to office which is her combination of disability.  Continue Invega 9 mg at bedtime and now hydroxyzine 25 mg at bedtime.  Recommended to call us back if she has any question or any concern.  Continue to encourage for healthy lifestyle and watch her calorie intake.  Follow-up in 3 months.    Follow Up Instructions:    I discussed the assessment and treatment plan with the patient. The  patient was provided an opportunity to ask questions and all were answered. The patient agreed with the plan and demonstrated an understanding of the instructions.   The patient was advised to call back or seek an in-person evaluation if the symptoms worsen or if the condition fails to improve as anticipated.  I provided 15 minutes of non-face-to-face time during this encounter.   Kathlee Nations, MD

## 2020-09-15 ENCOUNTER — Emergency Department (HOSPITAL_COMMUNITY)
Admission: EM | Admit: 2020-09-15 | Discharge: 2020-09-15 | Disposition: A | Discharge status: Home / Self Care | Payer: Medicaid Other | Attending: Emergency Medicine | Admitting: Emergency Medicine

## 2020-09-15 ENCOUNTER — Telehealth (HOSPITAL_COMMUNITY): Payer: Self-pay | Admitting: Psychiatry

## 2020-09-15 ENCOUNTER — Other Ambulatory Visit (HOSPITAL_COMMUNITY): Payer: Self-pay | Admitting: *Deleted

## 2020-09-15 ENCOUNTER — Encounter (HOSPITAL_COMMUNITY): Payer: Self-pay

## 2020-09-15 ENCOUNTER — Telehealth (HOSPITAL_COMMUNITY): Payer: Self-pay | Admitting: *Deleted

## 2020-09-15 DIAGNOSIS — R1084 Generalized abdominal pain: Secondary | ICD-10-CM | POA: Insufficient documentation

## 2020-09-15 DIAGNOSIS — R109 Unspecified abdominal pain: Secondary | ICD-10-CM | POA: Diagnosis present

## 2020-09-15 DIAGNOSIS — F200 Paranoid schizophrenia: Secondary | ICD-10-CM

## 2020-09-15 DIAGNOSIS — R110 Nausea: Secondary | ICD-10-CM | POA: Diagnosis not present

## 2020-09-15 LAB — URINALYSIS, ROUTINE W REFLEX MICROSCOPIC
Bilirubin Urine: NEGATIVE
Glucose, UA: NEGATIVE mg/dL
Hgb urine dipstick: NEGATIVE
Ketones, ur: NEGATIVE mg/dL
Leukocytes,Ua: NEGATIVE
Nitrite: NEGATIVE
Protein, ur: NEGATIVE mg/dL
Specific Gravity, Urine: 1.013 (ref 1.005–1.030)
pH: 6 (ref 5.0–8.0)

## 2020-09-15 MED ORDER — PALIPERIDONE ER 6 MG PO TB24: ORAL_TABLET | ORAL | 0 refills | Status: DC

## 2020-09-15 MED ORDER — FENTANYL CITRATE (PF) 100 MCG/2ML IJ SOLN
50.0000 ug | Freq: Once | INTRAMUSCULAR | Status: DC
  Filled 2020-09-15: qty 2

## 2020-09-15 MED ORDER — SODIUM CHLORIDE 0.9 % IV BOLUS: 1000.0000 mL | Freq: Once | INTRAVENOUS | Status: DC

## 2020-09-15 MED ORDER — ONDANSETRON HCL 4 MG/2ML IJ SOLN
4.0000 mg | Freq: Once | INTRAMUSCULAR | Status: DC
  Filled 2020-09-15: qty 2

## 2020-09-15 NOTE — ED Triage Notes (Signed)
Pt c/o nausea and abd cramping starting this morning, denies fevers or urinary symptoms

## 2020-09-15 NOTE — ED Provider Notes (Signed)
[facility]-EMERGENCY DEPT Provider Note   CSN: [id number] Arrival date & time: 09/15/20  7619     History Chief Complaint  Patient presents with  . Abdominal Pain    Lorraine Bryant is a 35 y.o. female.  Patient presents to the emergency department with a chief complaint of abdominal cramps.  She states that the cramps awaken her from sleep tonight.  She reports associated nausea.  Denies any fever or vomiting.  Denies any dysuria, hematuria, vaginal discharge or bleeding.  She is accompanied by her mother.  Rates pain is moderate.  Nothing makes his symptoms better or worse.  Tried taking Tums prior to arrival.  No relief with Tums.  The history is provided by the patient. No language interpreter was used.       Past Medical History:  Diagnosis Date  . Depression   . 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 Cleared by endocrinology per patient.   . Hallucinations   . Paranoid schizophrenia (HCC) 07/15/2017  . Psychosis (HCC)    per pt and family last year    Patient Active Problem List   Diagnosis Date Noted  . Secondary oligomenorrhea 12/20/2019  . 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 Schizophrenia, paranoid (HCC) 08/21/2017    Past Surgical History:  Procedure Laterality Date  . NO PAST SURGERIES       OB History    Gravida  0   Para  0   Term  0   Preterm  0   AB  0   Living  0     SAB  0   IAB  0   Ectopic  0   Multiple  0   Live Births  0           Family History  Problem Relation Age of Onset  . Drug abuse Father   . Schizophrenia Maternal Aunt     Social History   Tobacco Use  . Smoking status: Never Smoker  . Smokeless tobacco: Never Used  Vaping Use  . Vaping Use: Never used  Substance Use Topics  . Alcohol use: No  . Drug use: No    Comment: unable to assess d/t pt not responding to questioning    Home Medications Prior to  Admission medications   Medication Sig Start Date End Date Taking? Authorizing Provider  hydrOXYzine (VISTARIL) 25 MG capsule Take 1 capsule (25 mg total) by mouth at bedtime as needed. 08/24/20   Arfeen, Phillips Grout, MD  paliperidone (INVEGA) 9 MG 24 hr tablet Take one tab at 8 PM. 08/24/20   Arfeen, Phillips Grout, MD    Allergies    Abilify [aripiprazole]  Review of Systems   Review of Systems  All other systems reviewed and are negative.   Physical Exam Updated Vital Signs BP (!) 119/99   Pulse 80   Temp 97.7 F (36.5 C)   Resp 18   Ht 5\' 4"  (1.626 m)   Wt 99.8 kg   LMP 08/28/2020 (Within Days)   SpO2 99%   BMI 37.76 kg/m   Physical Exam Vitals and nursing note reviewed.  Constitutional:      General: She is not in acute distress.    Appearance: She is well-developed and well-nourished.  HENT:     Head: Normocephalic and atraumatic.  Eyes:     Conjunctiva/sclera: Conjunctivae normal.  Cardiovascular:     Rate and Rhythm: Normal rate and regular rhythm.  Heart sounds: No murmur heard.   Pulmonary:     Effort: Pulmonary effort is normal. No respiratory distress.     Breath sounds: Normal breath sounds.  Abdominal:     Palpations: Abdomen is soft.     Tenderness: There is no abdominal tenderness.     Comments: Abdomen is soft and nontender, there is mild discomfort in the lower abdomen, but no focal tenderness  Musculoskeletal:        General: No edema.     Cervical back: Neck supple.  Skin:    General: Skin is warm and dry.  Neurological:     Mental Status: She is alert.  Psychiatric:        Mood and Affect: Mood and affect normal.     ED Results / Procedures / Treatments   Labs (all labs ordered are listed, but only abnormal results are displayed) Labs Reviewed  CBC WITH DIFFERENTIAL/PLATELET  COMPREHENSIVE METABOLIC PANEL  URINALYSIS, ROUTINE W REFLEX MICROSCOPIC  LIPASE, BLOOD  I-STAT BETA HCG BLOOD, ED ([facility], [facility], [facility] ONLY)    EKG None  Radiology No  results found.  Procedures Procedures   Medications Ordered in ED Medications - No data to display  ED Course  I have reviewed the triage vital signs and the nursing notes.  Pertinent labs & imaging results that were available during my care of the patient were reviewed by me and considered in my medical decision making (see chart for details).    MDM Rules/Calculators/A&P                          Patient here with abdominal cramps.  Vitals are reassuring.  Abdominal exam is fairly reassuring.  She has some mild discomfort, but no focal tenderness.  Abdomen is not peritoneal take.  Will check basic labs, give fluids, some pain medicine and Zofran, and will reassess.  6:18 AM Patient refusing labs and blood work.  Her vitals are stable.  Her abdominal exam was benign.  Feel that watchful waiting is appropriate.  Will discharge given that she is refusing any additional work-up.  Return precautions given. Final Clinical Impression(s) / ED Diagnoses Final diagnoses:  Generalized abdominal pain    Rx / DC Orders ED Discharge Orders    None       Roxy Horseman, PA-C 09/15/20 0618    Palumbo, April, MD 09/15/20 [phone]

## 2020-09-15 NOTE — ED Notes (Addendum)
Patient refused all care. Patient did not want bloodwork or anything.

## 2020-09-15 NOTE — Telephone Encounter (Signed)
Writer spoke with pt mother, Efraim Kaufmann who is on contact list, regarding increasing Invega to 12 mg total qhs. Pt and mother agree. Pt will not the Vistaril due to "grogginess" in the a.m. but says she would be willing to try something else to help her sleep.

## 2020-09-15 NOTE — Telephone Encounter (Signed)
09/15/20 11:19am The pt's mother called stating that she need to speak with Dr. Lolly Mustache because there is something wrong with her.  There is no release in the system for the pt's mother.Marland KitchenMarguerite Olea

## 2020-10-12 ENCOUNTER — Ambulatory Visit: Payer: Medicaid Other | Attending: Physician Assistant | Admitting: Physical Therapy

## 2020-10-12 ENCOUNTER — Other Ambulatory Visit: Payer: Self-pay

## 2020-10-12 ENCOUNTER — Encounter: Payer: Self-pay | Admitting: Physical Therapy

## 2020-10-12 DIAGNOSIS — M25511 Pain in right shoulder: Secondary | ICD-10-CM

## 2020-10-12 DIAGNOSIS — M25571 Pain in right ankle and joints of right foot: Secondary | ICD-10-CM | POA: Diagnosis present

## 2020-10-12 DIAGNOSIS — M25561 Pain in right knee: Secondary | ICD-10-CM | POA: Diagnosis present

## 2020-10-12 DIAGNOSIS — M542 Cervicalgia: Secondary | ICD-10-CM

## 2020-10-12 DIAGNOSIS — M545 Low back pain, unspecified: Secondary | ICD-10-CM | POA: Diagnosis present

## 2020-10-12 NOTE — Therapy (Signed)
[facility] [address] [place], [place], [address] Phone: [phone]   Fax:  [phone]  Physical Therapy Evaluation  Patient Details  Name: Lorraine Bryant MRN: [id number] Date of Birth: [birth year] Referring Provider (PT): Erenest Rasher   Encounter Date: 10/12/2020   PT End of Session - 10/12/20 1810    Visit Number 1    Date for PT Re-Evaluation 12/10/20    PT Start Time 1445    PT Stop Time 1522    PT Time Calculation (min) 37 min    Activity Tolerance Patient tolerated treatment well    Behavior During Therapy [facility] for tasks assessed/performed           Past Medical History:  Diagnosis Date  . Depression   . 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 Cleared by endocrinology per patient.   . Hallucinations   . Paranoid schizophrenia (HCC) 07/15/2017  . Psychosis (HCC)    per pt and family last year    Past Surgical History:  Procedure Laterality Date  . NO PAST SURGERIES      There were no vitals filed for this visit.    Subjective Assessment - 10/12/20 1446    Subjective Pt reports that her R ankle, R shoulder, and R knee is hurting following a fall 09/19/20. Pt reports that she was walking in the grocery store and slipped on something wet, landing on R side. Went to urgent care and reports she had xrays neg for any acute bony abnormalities. Pt denies N/T in hands; states occasional tingling in L leg. Pt denies hitting head, denies blurred vision, double vision, or new HA. Pt reports that she has trouble standing for prolonged periods; states she cannot stand long enough to wash the dishes. Pt states she is having trouble bending over, reaching overhead into the cabinet, and lifting objects. Pt reports unable to wash hair without pain in RUE. Pt ambulates into clinic with [facility] today; states that she was walking with no AD prior to the fall.    Pertinent History hx of psychosis     Limitations Sitting;Lifting;Reading    How long can you stand comfortably? ~5-10 min    Diagnostic tests states xrays performed at urgent care    Patient Stated Goals get strength back and get rid of pain    Currently in Pain? Yes    Pain Score 9     Pain Location Knee   also ankle, shoulder   Pain Orientation Right    Pain Descriptors / Indicators Aching    Pain Type Acute pain    Pain Onset 1 to 4 weeks ago    Pain Frequency Constant    Aggravating Factors  bending over, reaching overhead, prolonged standing    Pain Relieving Factors lying down              [facility] PT Assessment - 10/12/20 0001      Assessment   Medical Diagnosis R knee, ankle, shoulder pain following fall    Referring Provider (PT) Vanstory    Onset Date/Surgical Date 09/19/20    Hand Dominance Right    Prior Therapy none      Precautions   Precautions None      Restrictions   Weight Bearing Restrictions No      Balance Screen   Has the patient fallen in the past 6 months Yes    How many times? 1    Has the  patient had a decrease in activity level because of a fear of falling?  No    Is the patient reluctant to leave their home because of a fear of falling?  No      Home Environment   Additional Comments bedroom on 1st floor; reports very difficult to go upstairs right now      Prior Function   Level of Independence Independent    Vocation Unemployed    Leisure housework, return to light office work      Sensation   Light Touch Appears Intact      Functional Tests   Functional tests Sit to Stand      Sit to Stand   Comments very guarded, slow painful movements      Posture/Postural Control   Posture/Postural Control Postural limitations    Postural Limitations Rounded Shoulders;Forward head      ROM / Strength   AROM / PROM / Strength AROM;Strength      AROM   AROM Assessment Site Shoulder;Lumbar    Right/Left Shoulder Right;Left    Right Shoulder Flexion 95 Degrees    Right  Shoulder ABduction 85 Degrees    Right Shoulder Internal Rotation --   unable   Right Shoulder External Rotation --   to iliac crest, painful   Left Shoulder Flexion 135 Degrees    Left Shoulder ABduction 108 Degrees    Left Shoulder Internal Rotation --   unable   Left Shoulder External Rotation --   to L iliac crest   Lumbar Flexion limited 75% + pain    Lumbar Extension limited 75% + pain    Lumbar - Right Side Bend limited 75% + pain    Lumbar - Left Side Bend limited 75% + pain    Lumbar - Right Rotation limited 75% + pain    Lumbar - Left Rotation limited 75% + pain      Strength   Overall Strength Comments UE strength testing limited d/t pain    Strength Assessment Site Hip;Knee;Ankle    Right/Left Hip Right;Left    Right Hip Flexion 3-/5    Right Hip ABduction 4/5    Left Hip Flexion 4-/5    Left Hip ABduction 4+/5    Right/Left Knee Right;Left    Right Knee Flexion 4+/5    Right Knee Extension 4/5    Left Knee Flexion 5/5    Left Knee Extension 5/5    Right/Left Ankle Right;Left    Right Ankle Dorsiflexion 4-/5    Left Ankle Dorsiflexion 4/5      Flexibility   Soft Tissue Assessment /Muscle Length yes    Hamstrings tight B R>L    ITB tight    Piriformis tight      Palpation   Palpation comment very tender to palpation R UT, R anterior shoulder, R knee joint line, R medial ankle      Special Tests   Other special tests special testing for shoulder/knee limited d/t pain      Transfers   Five time sit to stand comments  unable secondary to reports of pain      Ambulation/Gait   Ambulation/Gait Yes    Ambulation/Gait Assistance 6: Modified independent (Device/Increase time)    Ambulation Distance (Feet) 50 Feet    Assistive device Straight cane    Gait Comments antalgic gait; adjusted cane height and education on carrying cane on L side  Objective measurements completed on examination: See above findings.       [facility]  Adult PT Treatment/Exercise - 10/12/20 0001      Exercises   Exercises Shoulder;Knee/Hip;Lumbar      Lumbar Exercises: Stretches   Lower Trunk Rotation 5 reps;10 seconds    Pelvic Tilt 10 reps;5 seconds      Lumbar Exercises: Standing   Scapular Retraction Both;10 reps      Shoulder Exercises: Stretch   Table Stretch - Flexion 5 reps    Table Stretch - Abduction 5 reps                  PT Education - 10/12/20 1810    Education Details Pt educated on POC and HEP    Person(s) Educated Patient    Methods Explanation;Demonstration;Handout    Comprehension Verbalized understanding;Returned demonstration            PT Short Term Goals - 10/12/20 1811      PT SHORT TERM GOAL #1   Title Pt will be I with initial HEP    Time 2    Period Weeks    Status New    Target Date 10/26/20             PT Long Term Goals - 10/12/20 1811      PT LONG TERM GOAL #1   Title Pt will be I with advanced HEP    Time 6    Period Weeks    Status New    Target Date 11/23/20      PT LONG TERM GOAL #2   Title Pt will demo lumbar AROM <25% limited with no increase in LBP    Time 6    Period Weeks    Status New    Target Date 11/23/20      PT LONG TERM GOAL #3   Title Pt will demo B shoulder AROM WFL with no increase in shoulder pain    Time 6    Period Weeks    Status New    Target Date 11/23/20      PT LONG TERM GOAL #4   Title Pt will demo gait with no AD WFL over level/unlevel surfaces    Time 6    Period Weeks    Status New    Target Date 11/23/20      PT LONG TERM GOAL #5   Title Pt will report able to perform all ADLs with <1/10 pain    Time 6    Period Weeks    Status New    Target Date 11/23/20                  Plan - 10/12/20 1811    Clinical Impression Statement Pt presents to clinic with reports of acute R shoulder/neck/knee/ankle pain after a fall in the grocery store 09/21/2020. PMH includes hx of psychosis with paranoid schizophrenia. Pt  very guarded and painful with all motions this rx. Slow and guarded with bed mobility. MMT accuracy limited d/t pain. Tender to palpation R UT/thoracic paraspinals/lumbar paraspinals. Special testing for RC limited d/t acute pain. Lumbar and shoulder AROM limited and painful. Pt ambulates into clinic carrying SPC on R side; adjusted cane to appropriate height and provided education about carrying cane on L side with pt demo understanding. Prescribed lumbar and shoulder AROM ex's to get pt moving. Need to further assess cervical/ankle AROM next rx.    Personal Factors and Comorbidities Comorbidity 1  Comorbidities paranoid schizophrenia    Examination-Activity Limitations Stand;Stairs;Lift;Reach Overhead    Examination-Participation Restrictions Community Activity;Interpersonal Relationship    Stability/Clinical Decision Making Stable/Uncomplicated    Clinical Decision Making Low    Rehab Potential Good    PT Frequency 2x / week    PT Duration 6 weeks    PT Treatment/Interventions ADLs/Self Care Home Management;Electrical Stimulation;Iontophoresis 4mg /ml Dexamethasone;Moist Heat;Stair training;Gait training;Therapeutic activities;Therapeutic exercise;Neuromuscular re-education;Manual techniques;Patient/family education;Passive range of motion;Dry needling    PT Next Visit Plan slowly get pt moving with ex's/stretching to tolerance, assess cervical/ankle ROM, review and progress HEP, manual/modalities as indicated    PT Home Exercise Plan LTR, PPT, table stretch shoulder flexion/abduction, scap retraction    Consulted and Agree with Plan of Care Patient           Patient will benefit from skilled therapeutic intervention in order to improve the following deficits and impairments:  Abnormal gait,Pain,Improper body mechanics,Postural dysfunction,Decreased activity tolerance,Decreased range of motion,Decreased strength,Hypomobility,Impaired UE functional use,Difficulty walking,Impaired  flexibility  Visit Diagnosis: Acute bilateral low back pain without sciatica  Acute pain of right knee  Pain in right ankle and joints of right foot  Acute pain of right shoulder  Cervicalgia     Problem List Patient Active Problem List   Diagnosis Date Noted  . Secondary oligomenorrhea 12/20/2019  . 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 Schizophrenia, paranoid (HCC) 08/21/2017   Lysle RubensAmanda Issa Luster, PT, DPT Maryanna ShapeAmanda M Chicquita Mendel 10/12/2020, 9:46 PM  [facility] [address] [place], [place], [address] Phone: [phone]   Fax:  [phone]  Name: Lorraine ClinesJaleesia Bryant MRN: [id number] Date of Birth: 12/02/1985

## 2020-10-13 ENCOUNTER — Other Ambulatory Visit (HOSPITAL_COMMUNITY): Payer: Self-pay | Admitting: Psychiatry

## 2020-10-13 DIAGNOSIS — F200 Paranoid schizophrenia: Secondary | ICD-10-CM

## 2020-10-17 ENCOUNTER — Ambulatory Visit: Payer: Medicaid Other | Admitting: Physical Therapy

## 2020-10-19 ENCOUNTER — Ambulatory Visit: Payer: Medicaid Other | Attending: Physician Assistant

## 2020-10-19 DIAGNOSIS — M25561 Pain in right knee: Secondary | ICD-10-CM | POA: Diagnosis present

## 2020-10-19 DIAGNOSIS — M25511 Pain in right shoulder: Secondary | ICD-10-CM | POA: Diagnosis present

## 2020-10-19 DIAGNOSIS — M542 Cervicalgia: Secondary | ICD-10-CM | POA: Diagnosis present

## 2020-10-19 DIAGNOSIS — M545 Low back pain, unspecified: Secondary | ICD-10-CM | POA: Diagnosis present

## 2020-10-19 DIAGNOSIS — M25571 Pain in right ankle and joints of right foot: Secondary | ICD-10-CM | POA: Diagnosis present

## 2020-10-19 NOTE — Therapy (Signed)
[facility] [address] [place], [place], [address] Phone: [phone]   Fax:  [phone]  Physical Therapy Treatment  Patient Details  Name: Lorraine Bryant MRN: [id number] Date of Birth: 1985/10/01 Referring Provider (PT): Erenest Rasher   Encounter Date: 10/19/2020   PT End of Session - 10/19/20 1540    Visit Number 2    Date for PT Re-Evaluation 12/10/20    PT Start Time 1447    PT Stop Time 1530    PT Time Calculation (min) 43 min    Activity Tolerance Patient tolerated treatment well;Patient limited by pain    Behavior During Therapy [facility] for tasks assessed/performed           Past Medical History:  Diagnosis Date  . Depression   . 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 Cleared by endocrinology per patient.   . Hallucinations   . Paranoid schizophrenia (HCC) 07/15/2017  . Psychosis (HCC)    per pt and family last year    Past Surgical History:  Procedure Laterality Date  . NO PAST SURGERIES      There were no vitals filed for this visit.   Subjective Assessment - 10/19/20 1451    Subjective In pain but doing okay. Tried doing exercises a few time since eval. R ankle pain 9/10 front and sides sharp, R shoulder 10/10 today achy, R knee 8/10 sharp anterior knee              [facility] PT Assessment - 10/19/20 1454      AROM   AROM Assessment Site Cervical;Ankle    Right/Left Ankle Right;Left    Right Ankle Dorsiflexion -5    Right Ankle Plantar Flexion 34    Left Ankle Dorsiflexion 10    Left Ankle Plantar Flexion 40    Cervical Flexion 25% + pain lower cervical    Cervical Extension 25% not as sore    Cervical - Right Side Bend 25% + pain on right lower cervical "pulling"    Cervical - Left Side Bend 25% + pain on right lower cervical "pulling"    Cervical - Right Rotation 25% + pain on right lower cervical "pulling"    Cervical - Left Rotation 25% + pain on right  lower cervical "pulling"             [facility] Adult PT Treatment/Exercise - 10/19/20 0001      Exercises   Exercises Ankle      Lumbar Exercises: Stretches   Lower Trunk Rotation 5 reps;10 seconds    Pelvic Tilt 10 reps;5 seconds      Shoulder Exercises: Supine   Other Supine Exercises cane flexion AAROM 2x5. Cane ABD AAROM R 2x5      Manual Therapy   Manual therapy comments STM suboccipitals, R upper trap and cervical paraspinals. gentle distraction with and without small stretch into DFfor  R ankle with report of relief      Ankle Exercises: Seated   Ankle Circles/Pumps AROM;Both;5 reps   limited by pain                 PT Education - 10/19/20 1540    Education Details Updated HEP to add cane flexion and ABD AAROM    Person(s) Educated Patient    Methods Explanation;Demonstration;Handout    Comprehension Verbalized understanding;Returned demonstration            PT Short Term Goals - 10/12/20 1811  PT SHORT TERM GOAL #1   Title Pt will be I with initial HEP    Time 2    Period Weeks    Status New    Target Date 10/26/20             PT Long Term Goals - 10/12/20 1811      PT LONG TERM GOAL #1   Title Pt will be I with advanced HEP    Time 6    Period Weeks    Status New    Target Date 11/23/20      PT LONG TERM GOAL #2   Title Pt will demo lumbar AROM <25% limited with no increase in LBP    Time 6    Period Weeks    Status New    Target Date 11/23/20      PT LONG TERM GOAL #3   Title Pt will demo B shoulder AROM WFL with no increase in shoulder pain    Time 6    Period Weeks    Status New    Target Date 11/23/20      PT LONG TERM GOAL #4   Title Pt will demo gait with no AD WFL over level/unlevel surfaces    Time 6    Period Weeks    Status New    Target Date 11/23/20      PT LONG TERM GOAL #5   Title Pt will report able to perform all ADLs with <1/10 pain    Time 6    Period Weeks    Status New    Target Date 11/23/20                  Plan - 10/19/20 1541    Clinical Impression Statement Pt tolerated today's session fairly, grossly limited by pain. Assessed cervical and ankle ROM today both with significant limitaitons, tightness and pain. Pt had increased back pain with table AAROM exercises for shoulder at home and was instructed in cane AA flexion and ABD as alternative for home with good tolerance. Session ended with manual tx to neck and R ankle with some relief reported post    Personal Factors and Comorbidities Comorbidity 1    Comorbidities paranoid schizophrenia    Examination-Activity Limitations Stand;Stairs;Lift;Reach Overhead    Examination-Participation Restrictions Community Activity;Interpersonal Relationship    Rehab Potential Good    PT Frequency 2x / week    PT Duration 6 weeks    PT Treatment/Interventions ADLs/Self Care Home Management;Electrical Stimulation;Iontophoresis 4mg /ml Dexamethasone;Moist Heat;Stair training;Gait training;Therapeutic activities;Therapeutic exercise;Neuromuscular re-education;Manual techniques;Patient/family education;Passive range of motion;Dry needling    PT Next Visit Plan slowly get pt moving with ex's/stretching to tolerance,  review and progress HEP, manual/modalities as indicated. Visit 3/3 next session    Consulted and Agree with Plan of Care Patient           Patient will benefit from skilled therapeutic intervention in order to improve the following deficits and impairments:  Abnormal gait,Pain,Improper body mechanics,Postural dysfunction,Decreased activity tolerance,Decreased range of motion,Decreased strength,Hypomobility,Impaired UE functional use,Difficulty walking,Impaired flexibility  Visit Diagnosis: Acute bilateral low back pain without sciatica  Acute pain of right knee  Acute pain of right shoulder  Cervicalgia  Pain in right ankle and joints of right foot     Problem List Patient Active Problem List   Diagnosis Date Noted   . Secondary oligomenorrhea 12/20/2019  . 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 Schizophrenia, paranoid (HCC) 08/21/2017    Lorraine Bryant  L Gretta Samons , PT, DPT 10/19/2020, 3:46 PM  [facility] [address] [place], [place], [address] Phone: [phone]   Fax:  [phone]  Name: Tyleah Loh MRN: [id number] Date of Birth: 12/13/1985

## 2020-10-26 ENCOUNTER — Ambulatory Visit: Payer: Medicaid Other | Admitting: Physical Therapy

## 2020-11-02 ENCOUNTER — Encounter: Payer: Self-pay | Admitting: Physical Therapy

## 2020-11-02 ENCOUNTER — Other Ambulatory Visit: Payer: Self-pay

## 2020-11-02 ENCOUNTER — Ambulatory Visit: Payer: Medicaid Other | Admitting: Physical Therapy

## 2020-11-02 DIAGNOSIS — M25571 Pain in right ankle and joints of right foot: Secondary | ICD-10-CM

## 2020-11-02 DIAGNOSIS — M25511 Pain in right shoulder: Secondary | ICD-10-CM

## 2020-11-02 DIAGNOSIS — M545 Low back pain, unspecified: Secondary | ICD-10-CM

## 2020-11-02 DIAGNOSIS — M25561 Pain in right knee: Secondary | ICD-10-CM

## 2020-11-02 DIAGNOSIS — M542 Cervicalgia: Secondary | ICD-10-CM

## 2020-11-02 NOTE — Therapy (Signed)
[facility] [address] [place], [place], [address] Phone: [phone]   Fax:  [phone]  Physical Therapy Treatment  Patient Details  Name: Lorraine Bryant MRN: [id number] Date of Birth: 03-Jun-1986 Referring Provider (PT): Lorraine Bryant   Encounter Date: 11/02/2020   PT End of Session - 11/02/20 1539    Visit Number 3    Date for PT Re-Evaluation 12/10/20    PT Start Time 1448    PT Stop Time 1528    PT Time Calculation (min) 40 min    Activity Tolerance Patient tolerated treatment well;Patient limited by pain    Behavior During Therapy [facility] for tasks assessed/performed           Past Medical History:  Diagnosis Date  . Depression   . 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 Cleared by endocrinology per patient.   . Hallucinations   . Paranoid schizophrenia (HCC) 07/15/2017  . Psychosis (HCC)    per pt and family last year    Past Surgical History:  Procedure Laterality Date  . NO PAST SURGERIES      There were no vitals filed for this visit.   Subjective Assessment - 11/02/20 1453    Subjective Pt states doing better overall; reports R knee pain 8/10, 8.5/10 R ankle pain, R shoulder 7.5/10.    Currently in Pain? Yes    Pain Score 8     Pain Location Knee    Pain Orientation Right                             [facility] Adult PT Treatment/Exercise - 11/02/20 0001      Lumbar Exercises: Aerobic   UBE (Upper Arm Bike) L1.5 2 min each    Nustep L5 x 6 min      Lumbar Exercises: Seated   Other Seated Lumbar Exercises seated rows/extension red TB 2x10      Lumbar Exercises: Supine   Other Supine Lumbar Exercises LTR, dktc, small bridges on pball      Knee/Hip Exercises: Seated   Hamstring Curl Both;15 reps;1 set    Hamstring Limitations red tb    Sit to Sand --   unable to perform STS exercise secondary to pain     Shoulder Exercises: Seated    External Rotation Both;15 reps    Theraband Level (Shoulder External Rotation) Level 2 (Red)    Internal Rotation Right;15 reps    Theraband Level (Shoulder Internal Rotation) Level 2 (Red)                    PT Short Term Goals - 10/12/20 1811      PT SHORT TERM GOAL #1   Title Pt will be I with initial HEP    Time 2    Period Weeks    Status New    Target Date 10/26/20             PT Long Term Goals - 10/12/20 1811      PT LONG TERM GOAL #1   Title Pt will be I with advanced HEP    Time 6    Period Weeks    Status New    Target Date 11/23/20      PT LONG TERM GOAL #2   Title Pt will demo lumbar AROM <25% limited with no increase in LBP    Time 6  Period Weeks    Status New    Target Date 11/23/20      PT LONG TERM GOAL #3   Title Pt will demo B shoulder AROM WFL with no increase in shoulder pain    Time 6    Period Weeks    Status New    Target Date 11/23/20      PT LONG TERM GOAL #4   Title Pt will demo gait with no AD WFL over level/unlevel surfaces    Time 6    Period Weeks    Status New    Target Date 11/23/20      PT LONG TERM GOAL #5   Title Pt will report able to perform all ADLs with <1/10 pain    Time 6    Period Weeks    Status New    Target Date 11/23/20                 Plan - 11/02/20 1540    Clinical Impression Statement Pt presents to clinic stating better overall but still with high pain ratings and limited tolerance to exercise. States doing HEP but difficulty demoing ex's; reinforced education regarding HEP with pt demo understanding. Pt unable to tolerate any standing ex's this rx d/t reports of R knee pain. Instructed to continue icing with knee elevated at home. Continue to progress to tolerance.    PT Treatment/Interventions ADLs/Self Care Home Management;Electrical Stimulation;Iontophoresis 4mg /ml Dexamethasone;Moist Heat;Stair training;Gait training;Therapeutic activities;Therapeutic exercise;Neuromuscular  re-education;Manual techniques;Patient/family education;Passive range of motion;Dry needling    PT Next Visit Plan slowly get pt moving with ex's/stretching to tolerance,  review and progress HEP, manual/modalities as indicated. Visit 3/3 next session    Consulted and Agree with Plan of Care Patient           Patient will benefit from skilled therapeutic intervention in order to improve the following deficits and impairments:  Abnormal gait,Pain,Improper body mechanics,Postural dysfunction,Decreased activity tolerance,Decreased range of motion,Decreased strength,Hypomobility,Impaired UE functional use,Difficulty walking,Impaired flexibility  Visit Diagnosis: Acute bilateral low back pain without sciatica  Acute pain of right knee  Acute pain of right shoulder  Cervicalgia  Pain in right ankle and joints of right foot     Problem List Patient Active Problem List   Diagnosis Date Noted  . Secondary oligomenorrhea 12/20/2019  . 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 Schizophrenia, paranoid (HCC) 08/21/2017   10/19/2017, PT, DPT Lorraine Bryant Lorraine Bryant 11/02/2020, 3:42 PM  [facility] [address] [place], [place], [place] Phone: [phone]   Fax:  [phone]  Name: Lorraine Bryant MRN: [name] Date of Birth: Dec 07, 1985

## 2020-11-07 ENCOUNTER — Ambulatory Visit: Payer: Medicaid Other | Admitting: Physical Therapy

## 2020-11-07 ENCOUNTER — Other Ambulatory Visit: Payer: Self-pay

## 2020-11-07 ENCOUNTER — Encounter: Payer: Self-pay | Admitting: Physical Therapy

## 2020-11-07 DIAGNOSIS — M545 Low back pain, unspecified: Secondary | ICD-10-CM

## 2020-11-07 DIAGNOSIS — M25571 Pain in right ankle and joints of right foot: Secondary | ICD-10-CM

## 2020-11-07 DIAGNOSIS — M25511 Pain in right shoulder: Secondary | ICD-10-CM

## 2020-11-07 DIAGNOSIS — M25561 Pain in right knee: Secondary | ICD-10-CM

## 2020-11-07 NOTE — Therapy (Signed)
[facility] [address] [place], [place], [address] Phone: [phone]   Fax:  [phone]  Physical Therapy Treatment  Patient Details  Name: Lorraine Bryant MRN: [id number] Date of Birth: [birth year] Referring Provider (PT): Erenest Rasher   Encounter Date: 11/07/2020   PT End of Session - 11/07/20 1525    Visit Number 4    Date for PT Re-Evaluation 12/10/20    PT Start Time 1500    PT Stop Time 1529    PT Time Calculation (min) 29 min    Activity Tolerance Patient tolerated treatment well;Patient limited by pain    Behavior During Therapy [facility] for tasks assessed/performed           Past Medical History:  Diagnosis Date  . Depression   . 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 Cleared by endocrinology per patient.   . Hallucinations   . Paranoid schizophrenia (HCC) 07/15/2017  . Psychosis (HCC)    per pt and family last year    Past Surgical History:  Procedure Laterality Date  . NO PAST SURGERIES      There were no vitals filed for this visit.   Subjective Assessment - 11/07/20 1501    Subjective Pt reports high pain unchanged from last rx.    Currently in Pain? Yes    Pain Score 9     Pain Location Knee    Pain Orientation Right                             [facility] Adult PT Treatment/Exercise - 11/07/20 0001      Lumbar Exercises: Aerobic   Nustep L2 x   decreased resistance d/t increased R knee pain     Lumbar Exercises: Supine   Other Supine Lumbar Exercises LTR, dktc, small bridges on pball      Knee/Hip Exercises: Seated   Long Arc Quad Both;2 sets;10 reps    Long Arc Quad Weight 2 lbs.    Ball Squeeze x15 3 sec hold    Clamshell with TheraBand Red   x15   Other Seated Knee/Hip Exercises toes raises/heel raises 2# x15    Marching Both;2 sets;10 reps    Marching Limitations cues for increased ROM    Marching Weights 2 lbs.    Hamstring  Curl Both;15 reps;1 set    Hamstring Limitations red tb                    PT Short Term Goals - 10/12/20 1811      PT SHORT TERM GOAL #1   Title Pt will be I with initial HEP    Time 2    Period Weeks    Status New    Target Date 10/26/20             PT Long Term Goals - 10/12/20 1811      PT LONG TERM GOAL #1   Title Pt will be I with advanced HEP    Time 6    Period Weeks    Status New    Target Date 11/23/20      PT LONG TERM GOAL #2   Title Pt will demo lumbar AROM <25% limited with no increase in LBP    Time 6    Period Weeks    Status New    Target Date 11/23/20  PT LONG TERM GOAL #3   Title Pt will demo B shoulder AROM WFL with no increase in shoulder pain    Time 6    Period Weeks    Status New    Target Date 11/23/20      PT LONG TERM GOAL #4   Title Pt will demo gait with no AD WFL over level/unlevel surfaces    Time 6    Period Weeks    Status New    Target Date 11/23/20      PT LONG TERM GOAL #5   Title Pt will report able to perform all ADLs with <1/10 pain    Time 6    Period Weeks    Status New    Target Date 11/23/20                 Plan - 11/07/20 1525    Clinical Impression Statement Pt arrived 15 min late for session today, so interventions limited. Pt continues to demo antalgic gait, reports high levels of pain, and states she has made minimal improvements. Recommended pt return to MD for follow up; imaging may be indicated. Pt did demo improved gait pattern with increased heel strike and stance time RLE following nustep and light seated LE strengthening. Continue to progress.    PT Treatment/Interventions ADLs/Self Care Home Management;Electrical Stimulation;Iontophoresis 4mg /ml Dexamethasone;Moist Heat;Stair training;Gait training;Therapeutic activities;Therapeutic exercise;Neuromuscular re-education;Manual techniques;Patient/family education;Passive range of motion;Dry needling    PT Next Visit Plan slowly get  pt moving with ex's/stretching to tolerance,  review and progress HEP, manual/modalities as indicated.    Consulted and Agree with Plan of Care Patient           Patient will benefit from skilled therapeutic intervention in order to improve the following deficits and impairments:  Abnormal gait,Pain,Improper body mechanics,Postural dysfunction,Decreased activity tolerance,Decreased range of motion,Decreased strength,Hypomobility,Impaired UE functional use,Difficulty walking,Impaired flexibility  Visit Diagnosis: Acute bilateral low back pain without sciatica  Acute pain of right knee  Acute pain of right shoulder  Pain in right ankle and joints of right foot     Problem List Patient Active Problem List   Diagnosis Date Noted  . Secondary oligomenorrhea 12/20/2019  . 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 Schizophrenia, paranoid (HCC) 08/21/2017   10/19/2017, PT, DPT Lysle Rubens Sugg 11/07/2020, 3:33 PM  [facility] [address] [place], [place], [place] Phone: [phone]   Fax:  [phone]  Name: Lorraine Bryant MRN: [name] Date of Birth: 16-Sep-1985

## 2020-11-08 ENCOUNTER — Encounter (HOSPITAL_COMMUNITY): Payer: Self-pay | Admitting: Psychiatry

## 2020-11-08 ENCOUNTER — Telehealth (INDEPENDENT_AMBULATORY_CARE_PROVIDER_SITE_OTHER): Payer: Medicaid Other | Admitting: Psychiatry

## 2020-11-08 VITALS — Wt 204.0 lb

## 2020-11-08 DIAGNOSIS — F419 Anxiety disorder, unspecified: Secondary | ICD-10-CM | POA: Diagnosis not present

## 2020-11-08 DIAGNOSIS — F200 Paranoid schizophrenia: Secondary | ICD-10-CM | POA: Diagnosis not present

## 2020-11-08 MED ORDER — BUSPIRONE HCL 5 MG PO TABS: 5.0000 mg | ORAL_TABLET | Freq: Every day | ORAL | 0 refills | Status: DC

## 2020-11-08 MED ORDER — PALIPERIDONE ER 9 MG PO TB24: 9.0000 mg | ORAL_TABLET | Freq: Every day | ORAL | 2 refills | Status: DC

## 2020-11-08 NOTE — Progress Notes (Signed)
Virtual Visit via Telephone Note  I connected with Lorraine Bryant on 11/08/20 at  3:00 PM EDT by telephone and verified that I am speaking with the correct person using two identifiers.  Location: Patient: Home Provider: Home Office   I discussed the limitations, risks, security and privacy concerns of performing an evaluation and management service by telephone and the availability of in person appointments. I also discussed with the patient that there may be a patient responsible charge related to this service. The patient expressed understanding and agreed to proceed.   History of Present Illness: Patient is evaluated by phone session.  Her mother is also available on the phone.  Patient back on Invega 9 mg because she could not tolerate 12 mg.  Patient told it was making her sleepy and groggy.  She still have anxiety and nervousness.  She is not taking hydroxyzine for the same reason that it make her sleepy and groggy.  She does feel more calm when she talks with GOD but denies any hallucination, paranoia or any suicidal thoughts.  Her mother is concerned because patient wants to move to [place].  She like to move with her current a few months but her ultimate plan is to sell back.  She has lived in [facility] before and she like to place a lot.  Her appetite is okay.  Her weight is stable.  She is not working on disability.  Since she reduced the Invega 9 mg she has no tremors, shakes or any EPS.  Past Psychiatric History: H/Oinpatient at [facility], [facility].Triedtrazodone, Vistaril, Cogentin, Prolixin inj, Haldol, Abilify, olanzapine, Tegretol, Invega Sustenaand Latuda. Abilifycausedrestless, Latudamadenausea and olanzapine caused weight gain.   Psychiatric Specialty Exam: Physical Exam  Review of Systems  Weight 204 lb (92.5 kg).There is no height or weight on file to calculate BMI.  General Appearance: NA  Eye Contact:  NA  Speech:  Slow   Volume:  Normal  Mood:  Euthymic  Affect:  NA  Thought Process:  Goal Directed  Orientation:  Full (Time, Place, and Person)  Thought Content:  WDL  Suicidal Thoughts:  No  Homicidal Thoughts:  No  Memory:  Immediate;   Good Recent;   Fair Remote;   Fair  Judgement:  Fair  Insight:  Shallow  Psychomotor Activity:  NA  Concentration:  Concentration: Fair and Attention Span: Fair  Recall:  Good  Fund of Knowledge:  Good  Language:  Good  Akathisia:  No  Handed:  Right  AIMS (if indicated):     Assets:  Communication Skills Desire for Improvement Housing Resilience Social Support Transportation  ADL's:  Intact  Cognition:  WNL  Sleep:   ok      Assessment and Plan: Schizophrenia chronic paranoid type.  Anxiety.  I recommend she can try BuSpar 5 mg which she has never tried before.  We can provide a 30-day supply and if she feels it is helping her anxiety then she can ask for refills.  She agreed with the plan.  Continue Invega 9 mg as patient cannot tolerate 12 mg.  I agree that patient can try living with her friend in [place] but she must need to see a psychiatrist for her psychiatric care.  Patient and her mother agree and they will explore psychiatrist in [facility] before patient moved permanently there.  Discussed medication side effects and benefits.  Recommended to call us back if there is any question or any concern.  Follow-up in 3 months.  Follow Up Instructions:    I discussed the assessment and treatment plan with the patient. The patient was provided an opportunity to ask questions and all were answered. The patient agreed with the plan and demonstrated an understanding of the instructions.   The patient was advised to call back or seek an in-person evaluation if the symptoms worsen or if the condition fails to improve as anticipated.  I provided 17 minutes of non-face-to-face time during this encounter.   Cleotis Nipper, MD

## 2020-11-14 ENCOUNTER — Ambulatory Visit: Payer: Medicaid Other

## 2020-11-23 ENCOUNTER — Telehealth (HOSPITAL_COMMUNITY): Payer: Medicaid Other | Admitting: Psychiatry

## 2021-02-08 ENCOUNTER — Other Ambulatory Visit: Payer: Self-pay

## 2021-02-08 ENCOUNTER — Telehealth (INDEPENDENT_AMBULATORY_CARE_PROVIDER_SITE_OTHER): Payer: Medicaid Other | Admitting: Psychiatry

## 2021-02-08 DIAGNOSIS — F200 Paranoid schizophrenia: Secondary | ICD-10-CM

## 2021-02-08 DIAGNOSIS — F419 Anxiety disorder, unspecified: Secondary | ICD-10-CM | POA: Diagnosis not present

## 2021-02-08 MED ORDER — PALIPERIDONE ER 9 MG PO TB24: 9.0000 mg | ORAL_TABLET | Freq: Every day | ORAL | 2 refills | Status: DC

## 2021-02-08 NOTE — Progress Notes (Signed)
Virtual Visit via Telephone Note  I connected with Lorraine Bryant on 02/08/21 at  2:40 PM EDT by telephone and verified that I am speaking with the correct person using two identifiers.  Location: Patient: Aunt`s house in [place] Provider: Home Office   I discussed the limitations, risks, security and privacy concerns of performing an evaluation and management service by telephone and the availability of in person appointments. I also discussed with the patient that there may be a patient responsible charge related to this service. The patient expressed understanding and agreed to proceed.   History of Present Illness: Patient is evaluated by phone session.  She is currently visiting [place] and having a good time with her relatives.  She is staying at her aunt's house.  She is not sure if she wants to move from [place] as she is tending about her decision.  Overall she feels things are going well.  She is not anxious or nervous and did not pick up the BuSpar because she felt does not need it.  She admitted some nights she go to bed very late but getting at least 8 hours sleep.  She denies any paranoia, hallucination, anger, suicidal thoughts.  She is hoping to come back to [place] after [place] Day.  Her appetite is okay.  Her weight is stable.  She is now changing her diet and only eating vegetarian and quit the meat.  She like to focus on her health.  She understand that she need to take the medicine to help her symptoms and keep her stable.  She has no tremors, shakes or any EPS.  She is compliant with Invega 9 mg daily.   Past Psychiatric History:  H/O inpatient at [place], old [place] and [facility]. Tried trazodone, Vistaril, Cogentin, Prolixin inj, Haldol, Abilify, olanzapine, Tegretol, [place] and [place].  Abilify caused restless, Latuda made nausea and olanzapine caused weight gain.    Psychiatric Specialty Exam: Physical Exam  Review of Systems  There were no  vitals taken for this visit.There is no height or weight on file to calculate BMI.  General Appearance: NA  Eye Contact:  NA  Speech:  Slow  Volume:  Decreased  Mood:  Euthymic  Affect:  NA  Thought Process:  Descriptions of Associations: Intact  Orientation:  Full (Time, Place, and Person)  Thought Content:  Logical  Suicidal Thoughts:  No  Homicidal Thoughts:  No  Memory:  Immediate;   Fair Recent;   Fair Remote;   Fair  Judgement:  Fair  Insight:  Shallow  Psychomotor Activity:  NA  Concentration:  Concentration: Fair and Attention Span: Fair  Recall:  [employer] of Knowledge:  Fair  Language:  Good  Akathisia:  No  Handed:  Right  AIMS (if indicated):     Assets:  Communication Skills Desire for Improvement Housing Social Support  ADL's:  Intact  Cognition:  WNL  Sleep:   8 hrs but going late in bed      Assessment and Plan: Schizophrenia chronic paranoid type.  Anxiety.  Patient is stable on Invega 9 mg daily.  She did not pick up the BuSpar because she feels her anxiety is under control.  Currently she is in [place] visiting her relatives but hoping to come back after [place] Day.  Discussed medication side effects and benefits.  Encourage sleep hygiene.  Patient is now a vegetarian.  I encouraged to have her blood work results and see her PCP.  Her PCP is at [facility].  I recommend to call us back if she has any question or any concern.  Follow-up in 3 months.  Follow Up Instructions:    I discussed the assessment and treatment plan with the patient. The patient was provided an opportunity to ask questions and all were answered. The patient agreed with the plan and demonstrated an understanding of the instructions.   The patient was advised to call back or seek an in-person evaluation if the symptoms worsen or if the condition fails to improve as anticipated.  I provided 16 minutes of non-face-to-face time during this encounter.   Cleotis Nipper, MD

## 2021-03-03 ENCOUNTER — Other Ambulatory Visit (HOSPITAL_COMMUNITY): Payer: Self-pay | Admitting: Psychiatry

## 2021-03-03 ENCOUNTER — Ambulatory Visit (HOSPITAL_COMMUNITY)
Admission: EM | Admit: 2021-03-03 | Discharge: 2021-03-03 | Disposition: A | Discharge status: Home / Self Care | Payer: Medicaid Other | Attending: Physician Assistant | Admitting: Physician Assistant

## 2021-03-03 ENCOUNTER — Encounter (HOSPITAL_COMMUNITY): Payer: Self-pay

## 2021-03-03 DIAGNOSIS — F419 Anxiety disorder, unspecified: Secondary | ICD-10-CM

## 2021-03-03 DIAGNOSIS — J029 Acute pharyngitis, unspecified: Secondary | ICD-10-CM | POA: Diagnosis present

## 2021-03-03 DIAGNOSIS — Z20822 Contact with and (suspected) exposure to covid-19: Secondary | ICD-10-CM | POA: Insufficient documentation

## 2021-03-03 LAB — POCT RAPID STREP A, ED / UC: Streptococcus, Group A Screen (Direct): NEGATIVE

## 2021-03-03 NOTE — Discharge Instructions (Addendum)
COVID test pending, if positive stay home away from others for 5 days and then wear a mask around others for 5 days.  Recommend Tylenol as needed for pain.  Can continue Mucinex  Recommend Flonase

## 2021-03-03 NOTE — ED Provider Notes (Addendum)
[facility]    CSN: [id number] Arrival date & time: 03/03/21  1748      History   Chief Complaint Chief Complaint  Patient presents with   Cough    HPI Lorraine Bryant is a 35 y.o. female.   Pt complains of sore throat that started several days ago. Reports the sore throat has improved somewhat today. She is experiencing postnasal drip. She is taking Mucinex DM with some relief.  Denies fever, chills, n/v/d, cough, congestion.  She has not had her COVID vaccine. Mother is here today with similar sx. Requesting strep test.   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 Cleared by endocrinology per patient.    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   Schizophrenia, paranoid (HCC) 08/21/2017    Past Surgical History:  Procedure Laterality Date   NO PAST SURGERIES      OB History     Gravida  0   Para  0   Term  0   Preterm  0   AB  0   Living  0      SAB  0   IAB  0   Ectopic  0   Multiple  0   Live Births  0            Home Medications    Prior to Admission medications   Medication Sig Start Date End Date Taking? Authorizing Provider  busPIRone (BUSPAR) 5 MG tablet Take 1 tablet (5 mg total) by mouth daily. 11/08/20 11/08/21  Arfeen, Phillips Grout, MD  hydrOXYzine (VISTARIL) 25 MG capsule Take 1 capsule (25 mg total) by mouth at bedtime as needed. Patient not taking: Reported on 09/15/2020 08/24/20   Arfeen, Phillips Grout, MD  paliperidone (INVEGA) 9 MG 24 hr tablet Take 1 tablet (9 mg total) by mouth daily. 02/08/21   Arfeen, Phillips Grout, MD    Family History Family History  Problem Relation Age of Onset   Drug abuse Father    Schizophrenia  Maternal Aunt     Social History Social History   Tobacco Use   Smoking status: Never   Smokeless tobacco: Never  Vaping Use   Vaping Use: Never used  Substance Use Topics   Alcohol use: Yes    Comment: occa   Drug use: No    Comment: unable to assess d/t pt not responding to questioning     Allergies   Abilify [aripiprazole]   Review of Systems Review of Systems  Constitutional:  Negative for chills and fever.  HENT:  Positive for sore throat. Negative for congestion and ear pain.   Eyes:  Negative for pain and visual disturbance.  Respiratory:  Negative for cough and shortness of breath.   Cardiovascular:  Negative for chest pain and palpitations.  Gastrointestinal:  Negative for abdominal pain and vomiting.  Genitourinary:  Negative for dysuria and hematuria.  Musculoskeletal:  Negative for arthralgias and back pain.  Skin:  Negative for color change and rash.  Neurological:  Negative for seizures and syncope.  All other systems reviewed and are negative.   Physical Exam Triage Vital Signs ED Triage Vitals  Enc Vitals Group  BP 03/03/21 1808 113/80     Pulse Rate 03/03/21 1808 78     Resp 03/03/21 1808 18     Temp 03/03/21 1808 97.8 F (36.6 C)     Temp Source 03/03/21 1808 Oral     SpO2 03/03/21 1808 98 %     Weight --      Height --      Head Circumference --      Peak Flow --      Pain Score 03/03/21 1806 5     Pain Loc --      Pain Edu? --      Excl. in GC? --    No data found.  Updated Vital Signs BP 113/80 (BP Location: Left Arm)   Pulse 78   Temp 97.8 F (36.6 C) (Oral)   Resp 18   LMP 01/21/2021   SpO2 98%   Visual Acuity Right Eye Distance:   Left Eye Distance:   Bilateral Distance:    Right Eye Near:   Left Eye Near:    Bilateral Near:     Physical Exam Vitals and nursing note reviewed.  Constitutional:      General: She is not in acute distress.    Appearance: She is well-developed.  HENT:     Head: Normocephalic and  atraumatic.  Eyes:     Conjunctiva/sclera: Conjunctivae normal.  Cardiovascular:     Rate and Rhythm: Normal rate and regular rhythm.     Heart sounds: No murmur heard. Pulmonary:     Effort: Pulmonary effort is normal. No respiratory distress.     Breath sounds: Normal breath sounds.  Abdominal:     Palpations: Abdomen is soft.     Tenderness: There is no abdominal tenderness.  Musculoskeletal:     Cervical back: Neck supple.  Skin:    General: Skin is warm and dry.  Neurological:     Mental Status: She is alert.     UC Treatments / Results  Labs (all labs ordered are listed, but only abnormal results are displayed) Labs Reviewed - No data to display  EKG   Radiology No results found.  Procedures Procedures (including critical care time)  Medications Ordered in UC Medications - No data to display  Initial Impression / Assessment and Plan / UC Course  I have reviewed the triage vital signs and the nursing notes.  Pertinent labs & imaging results that were available during my care of the patient were reviewed by me and considered in my medical decision making (see chart for details).     Pt well appearing, vitals wnl, PE normal.  Strep negative. COVID test pending, isolation instructions given.  Pt will continue with Mucinex, tylenol, flonase as needed.  Return precautions discussed.   CBG entered in error, results from pt's mother with same last name.  Corrected by lab tech.  Final Clinical Impressions(s) / UC Diagnoses   Final diagnoses:  None   Discharge Instructions   None    ED Prescriptions   None    PDMP not reviewed this encounter.   Jodell Cipro, PA-C 03/03/21 1846    Jodell Cipro, PA-C 03/03/21 1854

## 2021-03-03 NOTE — ED Triage Notes (Signed)
Pt c/o sore throat, productive cough  Started: few days ago  Interventions: mucinex DM- somewhat helpful

## 2021-03-04 LAB — SARS CORONAVIRUS 2 (TAT 6-24 HRS): SARS Coronavirus 2: NEGATIVE

## 2021-03-05 ENCOUNTER — Telehealth (HOSPITAL_COMMUNITY): Payer: Self-pay

## 2021-03-05 NOTE — Telephone Encounter (Signed)
[facility] [employer] APPROVED  PALIPERIDONE 9MG  24 HR TABLET PA # EFFECTIVE 03/05/2021 TO 02/28/2022  S/W ALLISON REFERENCE ID # 03/02/2022 NOTIFIED PHARMACY AND LET THEM KNOW PT NOTIFIED [id number] SHE'S GOING OUT OF TOWN TOMORROW. PHARMACIST STATED THAT THEY WOULD GET IT READY AND NOTIFY PT WHEN IT'S READY FOR PICKUP

## 2021-03-06 ENCOUNTER — Telehealth (HOSPITAL_COMMUNITY): Payer: Self-pay | Admitting: Emergency Medicine

## 2021-03-06 NOTE — Telephone Encounter (Signed)
Returned call from 03/05/21 to give Pt COVID and Strep test results.  

## 2021-03-07 LAB — CBG MONITORING, ED: Glucose-Capillary: 577 mg/dL (ref 70–99)

## 2021-04-12 ENCOUNTER — Ambulatory Visit: Payer: No Typology Code available for payment source | Attending: Physician Assistant

## 2021-05-01 ENCOUNTER — Ambulatory Visit: Payer: No Typology Code available for payment source

## 2021-05-09 ENCOUNTER — Telehealth (INDEPENDENT_AMBULATORY_CARE_PROVIDER_SITE_OTHER): Payer: Medicaid Other | Admitting: Psychiatry

## 2021-05-09 ENCOUNTER — Encounter (HOSPITAL_COMMUNITY): Payer: Self-pay | Admitting: Psychiatry

## 2021-05-09 ENCOUNTER — Other Ambulatory Visit: Payer: Self-pay

## 2021-05-09 DIAGNOSIS — F419 Anxiety disorder, unspecified: Secondary | ICD-10-CM

## 2021-05-09 DIAGNOSIS — F200 Paranoid schizophrenia: Secondary | ICD-10-CM | POA: Diagnosis not present

## 2021-05-09 MED ORDER — HYDROXYZINE PAMOATE 25 MG PO CAPS: 25.0000 mg | ORAL_CAPSULE | Freq: Every evening | ORAL | 0 refills | Status: DC | PRN

## 2021-05-09 MED ORDER — PALIPERIDONE ER 9 MG PO TB24: 9.0000 mg | ORAL_TABLET | Freq: Every day | ORAL | 2 refills | Status: DC

## 2021-05-09 NOTE — Progress Notes (Signed)
Virtual Visit via Telephone Note  I connected with Lorraine Bryant on 05/09/21 at  3:00 PM EDT by telephone and verified that I am speaking with the correct person using two identifiers.  Location: Patient: In Car Provider: Home Office   I discussed the limitations, risks, security and privacy concerns of performing an evaluation and management service by telephone and the availability of in person appointments. I also discussed with the patient that there may be a patient responsible charge related to this service. The patient expressed understanding and agreed to proceed.   History of Present Illness: Patient is evaluated by phone session.  She is taking Invega and hydroxyzine.  She feels things are going very well.  She admitted talking to God when she is under stress helps her a lot.  She is sleeping good with the help of hydroxyzine.  She denies any paranoia, anger, suicidal thoughts.  Her appetite is okay.  She has no tremors, shakes or any EPS.  She had a good support from her family member.  She sleeps at least 7 to 8 hours.  She stopped the BuSpar because she felt it was making her tired and she feels he does not need it.  She wants to continue Invega and hydroxyzine.  Past Psychiatric History:  H/O inpatient at [facility], old [place] and [facility]. Tried trazodone, Vistaril, Cogentin, Prolixin inj, Haldol, Abilify, olanzapine, Tegretol, [place] and [place].  Abilify caused restless, Latuda made nausea and olanzapine caused weight gain.   Psychiatric Specialty Exam: Physical Exam  Review of Systems  Weight 208 lb (94.3 kg).There is no height or weight on file to calculate BMI.  General Appearance: NA  Eye Contact:  NA  Speech:  Normal Rate  Volume:  Normal  Mood:  Euthymic  Affect:  NA  Thought Process:  Descriptions of Associations: Intact  Orientation:  Full (Time, Place, and Person)  Thought Content:   religiously preoccupied  Suicidal Thoughts:  No  Homicidal  Thoughts:  No  Memory:  Immediate;   Fair Recent;   Fair Remote;   Fair  Judgement:  Fair  Insight:  Shallow  Psychomotor Activity:  NA  Concentration:  Concentration: Fair and Attention Span: Fair  Recall:  [employer] of Knowledge:  Good  Language:  Fair  Akathisia:  No  Handed:  Right  AIMS (if indicated):     Assets:  Communication Skills Desire for Improvement Housing Social Support  ADL's:  Intact  Cognition:  WNL  Sleep:   ok      Assessment and Plan: Schizophrenia chronic paranoid type.  Anxiety.  Patient is stable on Invega 9 mg daily and hydroxyzine 25 mg at bedtime.  She is no longer taking BuSpar for more than 7 months.  Discussed medication side effects and benefits.  Recommended to call us back if she has any question or any concern.  Follow-up in 3 months.  Follow Up Instructions:    I discussed the assessment and treatment plan with the patient. The patient was provided an opportunity to ask questions and all were answered. The patient agreed with the plan and demonstrated an understanding of the instructions.   The patient was advised to call back or seek an in-person evaluation if the symptoms worsen or if the condition fails to improve as anticipated.  I provided 14 minutes of non-face-to-face time during this encounter.   Cleotis Nipper, MD

## 2021-05-19 ENCOUNTER — Encounter (HOSPITAL_BASED_OUTPATIENT_CLINIC_OR_DEPARTMENT_OTHER): Payer: Self-pay

## 2021-05-19 ENCOUNTER — Emergency Department (HOSPITAL_BASED_OUTPATIENT_CLINIC_OR_DEPARTMENT_OTHER)
Admission: EM | Admit: 2021-05-19 | Discharge: 2021-05-20 | Disposition: A | Discharge status: Home / Self Care | Payer: Medicaid Other | Attending: Emergency Medicine | Admitting: Emergency Medicine

## 2021-05-19 DIAGNOSIS — M545 Low back pain, unspecified: Secondary | ICD-10-CM | POA: Diagnosis not present

## 2021-05-19 DIAGNOSIS — R102 Pelvic and perineal pain: Secondary | ICD-10-CM | POA: Diagnosis not present

## 2021-05-19 DIAGNOSIS — R109 Unspecified abdominal pain: Secondary | ICD-10-CM

## 2021-05-19 DIAGNOSIS — N926 Irregular menstruation, unspecified: Secondary | ICD-10-CM

## 2021-05-19 LAB — WET PREP, GENITAL
Clue Cells Wet Prep HPF POC: NONE SEEN
Sperm: NONE SEEN
Trich, Wet Prep: NONE SEEN
Yeast Wet Prep HPF POC: NONE SEEN

## 2021-05-19 LAB — URINALYSIS, ROUTINE W REFLEX MICROSCOPIC
Bilirubin Urine: NEGATIVE
Glucose, UA: NEGATIVE mg/dL
Hgb urine dipstick: NEGATIVE
Ketones, ur: NEGATIVE mg/dL
Leukocytes,Ua: NEGATIVE
Nitrite: NEGATIVE
Protein, ur: NEGATIVE mg/dL
Specific Gravity, Urine: <1.005 — ABNORMAL LOW (ref 1.005–1.030)
pH: 7 (ref 5.0–8.0)

## 2021-05-19 LAB — PREGNANCY, URINE: Preg Test, Ur: NEGATIVE

## 2021-05-19 MED ORDER — CEFTRIAXONE SODIUM 500 MG IJ SOLR
500.0000 mg | Freq: Once | INTRAMUSCULAR | Status: AC
  Administered 2021-05-19: 500 mg via INTRAMUSCULAR
  Filled 2021-05-19: qty 500

## 2021-05-19 MED ORDER — AZITHROMYCIN 250 MG PO TABS
1000.0000 mg | ORAL_TABLET | Freq: Once | ORAL | Status: AC
  Administered 2021-05-19: 1000 mg via ORAL
  Filled 2021-05-19: qty 4

## 2021-05-19 NOTE — ED Notes (Signed)
Pt unable to void at this time. Pt instructed on UA collection and given specimen cup.

## 2021-05-19 NOTE — Discharge Instructions (Signed)
Follow up with your family doctor.  Return for worsening pain, fever, inability to eat or drink.

## 2021-05-19 NOTE — ED Triage Notes (Signed)
Pt is present for "pelvic pain and pressure" that radiates to her back that started last night. Pt states the pain is very sharp and rates it 8/10. Pt has not had a period in three months. Denies UA sx.

## 2021-05-19 NOTE — ED Notes (Signed)
Pt has been given water and sprite and encouraged multiple times to pee.

## 2021-05-19 NOTE — ED Provider Notes (Signed)
I received the patient in signout from Dr. Renaye Rakers, patient with lower abdominal cramping.  Plan for UA and U preg.  Patient is not pregnant UA not obviously infected.  Based on the reported pelvic exam with some significant discharge will cover for possible PID.  We will have the patient follow-up with her family doctor.   Melene Plan, DO 05/19/21 2345

## 2021-05-19 NOTE — ED Notes (Signed)
Assisted MD with pelvic exam. Swabs sent to lab. Pt understands that we still need urine collection.

## 2021-05-19 NOTE — ED Provider Notes (Signed)
[facility] EMERGENCY DEPT Provider Note   CSN: [id number] Arrival date & time: 05/19/21  2129     History Chief Complaint  Patient presents with   Pelvic Pain   Back Pain    Lorraine Bryant is a 35 y.o. female presented emerged part with pelvic pain and back pain.  She reports onset of her symptoms last night.  She describes a pressure sensation to suprapubic, with also "a sharp pain in the vagina".  She also reports some throbbing pain in her lower back.  She says her last menstrual period was approx in may or June 2022, and typically she had regular menstrual periods before that.  She is sexually active with a female partner.  She has never been pregnant before.  She denies any vaginal discharge.  She reports she has had some "morning sickness nausea" for the past several days or weeks.  She is here with her mother at bedside.  She denies any history of abdominal surgery.  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 Cleared by endocrinology per patient.    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   Schizophrenia, paranoid (HCC) 08/21/2017    Past Surgical History:  Procedure Laterality Date   NO PAST SURGERIES       OB History     Gravida  0   Para  0   Term  0   Preterm  0   AB  0   Living  0      SAB  0   IAB  0   Ectopic  0   Multiple  0   Live Births  0           Family History  Problem Relation Age of Onset   Drug abuse Father    Schizophrenia Maternal Aunt     Social History   Tobacco Use   Smoking status: Never   Smokeless tobacco: Never  Vaping Use   Vaping Use: Never used  Substance Use Topics    Alcohol use: Yes    Comment: occa   Drug use: No    Comment: unable to assess d/t pt not responding to questioning    Home Medications Prior to Admission medications   Medication Sig Start Date End Date Taking? Authorizing Provider  busPIRone (BUSPAR) 5 MG tablet Take 1 tablet (5 mg total) by mouth daily. Patient not taking: Reported on 05/09/2021 11/08/20 11/08/21  Cleotis Nipper, MD  hydrOXYzine (VISTARIL) 25 MG capsule Take 1 capsule (25 mg total) by mouth at bedtime as needed. 05/09/21   Arfeen, Phillips Grout, MD  paliperidone (INVEGA) 9 MG 24 hr tablet Take 1 tablet (9 mg total) by mouth daily. 05/09/21   Arfeen, Phillips Grout, MD    Allergies    Abilify [aripiprazole]  Review of Systems   Review of Systems  Constitutional:  Negative for chills and fever.  Eyes:  Negative for photophobia and visual disturbance.  Respiratory:  Negative for cough and shortness of breath.   Cardiovascular:  Negative for chest pain and palpitations.  Gastrointestinal:  Negative for abdominal pain and vomiting.  Genitourinary:  Positive for pelvic  pain and vaginal pain. Negative for dysuria, vaginal bleeding and vaginal discharge.  Musculoskeletal:  Negative for arthralgias and myalgias.  Skin:  Negative for color change and rash.  Neurological:  Negative for seizures and syncope.  All other systems reviewed and are negative.  Physical Exam Updated Vital Signs BP 124/80 (BP Location: Right Arm)   Pulse 73   Temp 97.7 F (36.5 C) (Oral)   Resp 18   Ht 5\' 3"  (1.6 m)   Wt 95.3 kg   LMP 02/17/2021 Comment: appx  SpO2 100%   BMI 37.20 kg/m   Physical Exam Constitutional:      General: She is not in acute distress. HENT:     Head: Normocephalic and atraumatic.  Eyes:     Conjunctiva/sclera: Conjunctivae normal.     Pupils: Pupils are equal, round, and reactive to light.  Cardiovascular:     Rate and Rhythm: Normal rate and regular rhythm.     Pulses: Normal pulses.  Pulmonary:     Effort: Pulmonary  effort is normal. No respiratory distress.  Abdominal:     General: There is no distension.     Tenderness: There is no abdominal tenderness. There is no guarding.  Genitourinary:    Comments: Exam performed with chaperone present. External: Normal external female genitalia. No lesions, rashes, drainage, or suspicious lymph nodes. Internal: No CMT. Cervix closed and without erythema. Moderate white discharge noted in vaginal vault.  No adnexal tenderness, swelling, or masses. No lacerations. No foreign bodies.   Skin:    General: Skin is warm and dry.  Neurological:     General: No focal deficit present.     Mental Status: She is alert. Mental status is at baseline.  Psychiatric:        Mood and Affect: Mood normal.        Behavior: Behavior normal.    ED Results / Procedures / Treatments   Labs (all labs ordered are listed, but only abnormal results are displayed) Labs Reviewed  WET PREP, GENITAL - Abnormal; Notable for the following components:      Result Value   WBC, Wet Prep HPF POC FEW (*)    All other components within normal limits  URINALYSIS, ROUTINE W REFLEX MICROSCOPIC  PREGNANCY, URINE  GC/CHLAMYDIA PROBE AMP ([place]) NOT AT [facility]    EKG None  Radiology No results found.  Procedures Procedures   Medications Ordered in ED Medications - No data to display  ED Course  I have reviewed the triage vital signs and the nursing notes.  Pertinent labs & imaging results that were available during my care of the patient were reviewed by me and considered in my medical decision making (see chart for details).  Rectal diagnosis includes UTI versus kidney stone versus pregnancy versus menstrual cramping versus other.  Pelvic exam showed moderate white discharge in the vaginal vault.  Wet prep is sent.  We will also send a GC chlamydia.    Doubt PID or ovarian torsion based on clinical presentation  Patient was given water to help hydrate her to provide  urinalysis.  We are pending a UA as well as urine pregnancy test.  Clinical Course as of 05/19/21 2318  Sat May 19, 2021  2317 Signed out to Dr 2318 EDP [MT]    Clinical Course User Index [MT] Adela Lank, Renaye Rakers, MD    Final Clinical Impression(s) / ED Diagnoses Final diagnoses:  None    Rx / DC Orders ED Discharge  Orders     None        Terald Sleeper, MD 05/19/21 [phone]

## 2021-05-21 LAB — GC/CHLAMYDIA PROBE AMP (~~LOC~~) NOT AT ARMC
Chlamydia: NEGATIVE
Comment: NEGATIVE
Comment: NORMAL
Neisseria Gonorrhea: NEGATIVE

## 2021-06-07 ENCOUNTER — Other Ambulatory Visit (HOSPITAL_COMMUNITY): Payer: Self-pay | Admitting: Psychiatry

## 2021-06-07 DIAGNOSIS — F419 Anxiety disorder, unspecified: Secondary | ICD-10-CM

## 2021-07-04 ENCOUNTER — Encounter (HOSPITAL_COMMUNITY): Payer: Self-pay | Admitting: Psychiatry

## 2021-07-04 ENCOUNTER — Telehealth (HOSPITAL_BASED_OUTPATIENT_CLINIC_OR_DEPARTMENT_OTHER): Payer: Medicaid Other | Admitting: Psychiatry

## 2021-07-04 ENCOUNTER — Other Ambulatory Visit: Payer: Self-pay

## 2021-07-04 DIAGNOSIS — F419 Anxiety disorder, unspecified: Secondary | ICD-10-CM | POA: Diagnosis not present

## 2021-07-04 DIAGNOSIS — F200 Paranoid schizophrenia: Secondary | ICD-10-CM | POA: Diagnosis not present

## 2021-07-04 MED ORDER — PALIPERIDONE ER 9 MG PO TB24: 9.0000 mg | ORAL_TABLET | Freq: Every day | ORAL | 0 refills | Status: DC

## 2021-07-04 MED ORDER — PALIPERIDONE ER 9 MG PO TB24: 9.0000 mg | ORAL_TABLET | Freq: Every day | ORAL | 2 refills | Status: DC

## 2021-07-04 MED ORDER — HYDROXYZINE PAMOATE 25 MG PO CAPS: 25.0000 mg | ORAL_CAPSULE | Freq: Every evening | ORAL | 1 refills | Status: AC | PRN

## 2021-07-04 NOTE — Progress Notes (Signed)
Virtual Visit via Telephone Note  I connected with [name] on 07/04/21 at  2:40 PM EST by telephone and verified that I am speaking with the correct person using two identifiers.  Location: Patient: Home Provider: Office   I discussed the limitations, risks, security and privacy concerns of performing an evaluation and management service by telephone and the availability of in person appointments. I also discussed with the patient that there may be a patient responsible charge related to this service. The patient expressed understanding and agreed to proceed.   History of Present Illness: Patient is evaluated by phone session.  Her mother picked up the phone and she told that patient lately anxious and not sleeping good.  She was given a few times hydroxyzine that helps her anxiety and sleep.  She also mentioned that around her monthly cycle she gets more frustrated and sometimes more religiously preoccupied.  She told patient like to get pregnant and now she is sexually active and recently had a visit to the emergency room for pelvic pain.  Her pregnancy test was negative.  I spoke to the patient who admitted that she had a visit to the emergency room for pelvic pain but now she is feeling better.  I had a long discussion with the patient about her chronic condition and desire to get pregnant.  She understand that if she wants to get pregnant and then she need to stop taking the medication and at this time she does not want to take any risk.  Sometimes she does get religiously preoccupied and talked to God but denies any hallucination, paranoia, suicidal thoughts.  She had a good support from her mother.  Her grandparents live in the same house but sometimes she does not engage with them as her mother likes to engage.  Most of the nights she sleeps 7 to 8 hours.  She denies any panic attack, mania or any anger.  Her weight is unchanged from the past.    Past Psychiatric History:  H/O  inpatient at [place], old [place] and [facility]. Tried trazodone, Vistaril, Cogentin, Prolixin inj, Haldol, Abilify, olanzapine, Tegretol, [place] and [place].  Abilify caused restless, Latuda made nausea and olanzapine caused weight gain.   Psychiatric Specialty Exam: Physical Exam  Review of Systems  Weight 210 lb (95.3 kg).Body mass index is 37.2 kg/m.  General Appearance: NA  Eye Contact:  NA  Speech:  Slow  Volume:  Decreased  Mood:  Anxious  Affect:  NA  Thought Process:  Descriptions of Associations: Intact  Orientation:  Full (Time, Place, and Person)  Thought Content:   religiously preocupied  Suicidal Thoughts:  No  Homicidal Thoughts:  No  Memory:  Immediate;   Fair Recent;   Fair Remote;   Fair  Judgement:  Fair  Insight:  Shallow  Psychomotor Activity:  NA  Concentration:  Concentration: Fair and Attention Span: Fair  Recall:  [employer] of Knowledge:  Fair  Language:  Fair  Akathisia:  No  Handed:  Right  AIMS (if indicated):     Assets:  Communication Skills Desire for Improvement Housing Social Support  ADL's:  Intact  Cognition:  WNL  Sleep:   fair      Assessment and Plan: Schizophrenia chronic paranoid type.  Anxiety.  I had a long discussion with the patient and I also spoke to her mother.  Patient understand that she has a desire to get pregnant and now she is sexually active but  given her diagnosis and need to be on medication at this time she would like to be on current psychotropic medication because she do not want to get unstable or decompensate.  She has upcoming appointment with her OB/GYN and she will discuss more in detail about her monthly cycles when she feels more upset and irritable.  She was also discussed with her OB/GYN that if she ever like to get pregnant then what medicine she can take.  Patient agreed to continue Invega 9 mg daily and hydroxyzine 25 mg to take as needed.  She excited about upcoming trip to [place] on  holidays.  I recommended to call us back if she has any question or any concern.  Also discussed that anytime she change her plan to get pregnant and then she need to inform us immediately because of the medication are not safe for the baby.  Patient acknowledged.  Follow-up in 3 months.  Patient prefer 90-day supply of Invega.  Follow Up Instructions:    I discussed the assessment and treatment plan with the patient. The patient was provided an opportunity to ask questions and all were answered. The patient agreed with the plan and demonstrated an understanding of the instructions.   The patient was advised to call back or seek an in-person evaluation if the symptoms worsen or if the condition fails to improve as anticipated.  I provided 23 minutes of non-face-to-face time during this encounter.   Cleotis Nipper, MD

## 2021-07-27 ENCOUNTER — Telehealth (HOSPITAL_COMMUNITY): Payer: Medicaid Other | Admitting: Psychiatry

## 2021-08-01 ENCOUNTER — Telehealth (HOSPITAL_COMMUNITY): Payer: Medicaid Other | Admitting: Psychiatry

## 2021-08-06 ENCOUNTER — Encounter (HOSPITAL_BASED_OUTPATIENT_CLINIC_OR_DEPARTMENT_OTHER): Payer: Self-pay

## 2021-08-06 ENCOUNTER — Other Ambulatory Visit: Payer: Self-pay

## 2021-08-06 DIAGNOSIS — Z046 Encounter for general psychiatric examination, requested by authority: Secondary | ICD-10-CM | POA: Diagnosis present

## 2021-08-06 DIAGNOSIS — F22 Delusional disorders: Secondary | ICD-10-CM | POA: Insufficient documentation

## 2021-08-06 DIAGNOSIS — F209 Schizophrenia, unspecified: Secondary | ICD-10-CM | POA: Diagnosis not present

## 2021-08-06 DIAGNOSIS — Z20822 Contact with and (suspected) exposure to covid-19: Secondary | ICD-10-CM | POA: Insufficient documentation

## 2021-08-06 NOTE — ED Triage Notes (Signed)
Patient here POV from Home with Anxiety.  Patient states she feels uncomfortable at Home. Patient feels as if her Privacy is being invaded at Home and people are shifting her items at Home.  Patient Tearful in Triage. A&Ox4. GCS 15. Ambulatory.

## 2021-08-06 NOTE — ED Notes (Signed)
No SI; No HI; No AVH; patient states she feels unsafe at Home due to her Family.

## 2021-08-07 ENCOUNTER — Emergency Department (HOSPITAL_BASED_OUTPATIENT_CLINIC_OR_DEPARTMENT_OTHER)
Admission: EM | Admit: 2021-08-07 | Discharge: 2021-08-07 | Disposition: A | Discharge status: Home / Self Care | Payer: Medicaid Other | Attending: Emergency Medicine | Admitting: Emergency Medicine

## 2021-08-07 DIAGNOSIS — F22 Delusional disorders: Secondary | ICD-10-CM

## 2021-08-07 LAB — RESP PANEL BY RT-PCR (FLU A&B, COVID) ARPGX2
Influenza A by PCR: NEGATIVE
Influenza B by PCR: NEGATIVE
SARS Coronavirus 2 by RT PCR: NEGATIVE

## 2021-08-07 NOTE — ED Notes (Signed)
Attempted to call TTS [phone] with no answer after 12 rings.

## 2021-08-07 NOTE — ED Notes (Signed)
Pt approached the desk and stated she is leaving, her ride is coming. Writer explained she can not leave and if she does, unfortunately she will be brought back by the police, encouraged to go back to her room, pt compliant, Clinical research associate notified security of pt wanting to leave. She was wanded and phone was removed from room. At this time pt is in bed with security outside the door, waiting for TTS verdict as to IVC papers.

## 2021-08-07 NOTE — ED Notes (Signed)
Pt keeps coming out of room, aware she needs to remain in the room, staff is trying to get update on disposition.

## 2021-08-07 NOTE — ED Notes (Signed)
Pt is currently on the phone with friend, Fleet Contras. This is the 1st out of 3 5-minute phone calls used today.

## 2021-08-07 NOTE — BH Assessment (Signed)
Comprehensive Clinical Assessment (CCA) Note  08/07/2021 Lorraine Bryant [id number]  DISPOSITION: Per Assunta Found NP, Pt is psych cleared. Recommend contacting her psychiatrist's office for assistance and instruction.  The patient demonstrates the following risk factors for suicide: Chronic risk factors for suicide include: psychiatric disorder of Schizophrenia . Acute risk factors for suicide include: family or marital conflict. Protective factors for this patient include: positive social support, positive therapeutic relationship, and hope for the future. Considering these factors, the overall suicide risk at this point appears to be low. Patient is appropriate for outpatient follow up.  Flowsheet Row ED from 08/07/2021 in [facility] ED from 05/19/2021 in [facility] ED from 03/03/2021 in [facility] at [facility] RISK CATEGORY No Risk No Risk Error: Question 6 not populated       Pt is a 35 yo female who was brought in under IVC accompanied by her Lorraine Bryant. Actually, per Lorraine Bryant, pt was accompanying her due to the Lorraine Bryant believing a diabetic monitoring sensor was stuck in her arm. Per Lorraine Bryant, while there and their prolonged wait, pt began getting fidgety and "jumping up" to go to the bathroom and water fountain. At one point, when pt's watch alarm went off to remind her of time to take medication, pt ignored the alarm and did not take her meds. Lorraine Bryant prompted pt to take her medication and pt erratically "jumped up and appeared to take it." Afterwards, per mom, pt went to the front desk, "burst into tears" and told the staff something that mom could not hear. When mom followed her there, pt pulled away from mom and told her she did not trust her. Lorraine Bryant stated she reported some erratic behavior to staff. Staff suggested Lorraine Bryant IVC pt when pt stated she wanted to leave. Pt has a hx of paranoid schizophrenia and periods of  erratic behavior per mom which is often indication that pt may not be taking her prescribed medication. Pt sees Dr. Lolly Mustache and has an appointment 08/30/20. Lorraine Bryant called his office yesterday and he is on vacation. Lorraine Bryant is awaiting a callback from his on-call provider. Lorraine Bryant stated that pt has been "out of character" in recent weeks.   Pt lives with her Lorraine Bryant and maternal grandparents. Pt has some possible delusions of her family in her home is "moving my stuff around."  Pt added that she feels her Lorraine Bryant smothers her. Lorraine Bryant stated that as pt's caregiver, she has had to increase her level of supervision over the pt due to concerns she may not be taking her prescribed medications consistently or on time and her increased erratic behavior in recent weeks. Lorraine Bryant stated that pt has been "gazing off." not her usual bubbly and friendly self and one night a week or so ago, pt left her home and walked to a neighbor's home knocking on the door at 11:30 pm. When they did not answer, she went to another neighbor's home. The police were called without incident.   Pt denies SI, HI, NSSH, AVH, paranoia and substance use. Lorraine Bryant denied pt had every attempted suicide, made suicidal statements, threatened to hurt anyone or actually hurt anyone. Pt has established relationship with Dr. Lolly Mustache for medication management. Pt stopped OP therapy in 2020 stating she had been discharged because she was doing so well. Pt stated that she sleeps about 7 hours per night. Lorraine Bryant stated that pt does not sleep well recently. She stated she was unsure the number of hours but she  found her up at night often. 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  Chief Complaint:  Chief Complaint  Patient presents with   Anxiety   Visit Diagnosis:  Schizophrenia    CCA Screening, Triage and Referral (STR)  Patient Reported Information How did you hear about  [place]? Family/Friend  What Is the Reason for Your Visit/Call Today? Pt is a 35 yo female who was brought in under IVC accompanied by her Lorraine Bryant. Actually, per Lorraine Bryant, pt was accompanying her due to the Lorraine Bryant believing a diabetic monitoring sensor was stuck in her arm. Per Lorraine Bryant, while there and their prolonged wait, pt began getting fidgety and "jumping up" to go to the bathroom and water fountain. At one point, when pt's watch alarm went off to remind her of time to take medication, pt ignored the alarm and did not take her meds. Lorraine Bryant prompted pt to take her medication and pt erratically "jumped up and appeared to take it." Afterwards, per mom, pt went to the front desk, "burst into tears" and told the staff something that mom could not hear. When mom followed her there, pt pulled away from mom and told her she did not trust her. Lorraine Bryant stated she reported some erratic behavior to staff. Staff suggested Lorraine Bryant IVC pt when pt stated she wanted to leave. Pt has a hx of paranoid schizophrenia and periods of erratic behavior per mom which is often indication that pt may not be taking her prescribed medication. Pt sees Dr. Lolly Mustache and has an appointment 08/30/20. Lorraine Bryant called his office yesterday and he is on vacation. Lorraine Bryant is awaiting a callback from his on-call provider. Lorraine Bryant stated that pt has been "out of character" in recent weeks. Lorraine Bryant stated that pt has been "gazing off." not her usual bubbly and friendly self and one night a week or so ago, pt left her home and walked to a neighbors home knocking on the door at 11:30 pm. When they did not answer, she went to another neighbors home. The police were called without incident. Pt denies SI, HI, NSSH, AVH, paranoia and substance use. Lorraine Bryant denied pt had every attempted suicide, made suicidal statements, threatened to hurt anyone or actually hurt anyone. Pt has established relationship with Dr. Lolly Mustache for medication management. Pt stopped OP therapy in 2020  stating she had been discharged because she was doing so well.  How Long Has This Been Causing You Problems? 1 wk - 1 month  What Do You Feel Would Help You the Most Today? No data recorded  Have You Recently Had Any Thoughts About Hurting Yourself? No  Are You Planning to Commit Suicide/Harm Yourself At This time? No   Have you Recently Had Thoughts About Hurting Someone Karolee Ohs? No  Are You Planning to Harm Someone at This Time? No  Explanation: No data recorded  Have You Used Any Alcohol or Drugs in the Past 24 Hours? No  How Long Ago Did You Use Drugs or Alcohol? No data recorded What Did You Use and How Much? No data recorded  Do You Currently Have a Therapist/Psychiatrist? Yes  Name of Therapist/Psychiatrist: Dr. Lolly Mustache   Have You Been Recently Discharged From Any Office Practice or Programs? No  Explanation of Discharge From Practice/Program: No data recorded    CCA Screening Triage Referral Assessment Type of Contact: Tele-Assessment  Telemedicine Service Delivery:   Is this Initial  or Reassessment? Initial Assessment  Date Telepsych consult ordered in [facility]:  08/07/21  Time Telepsych consult ordered in [facility]:  0830  Location of Assessment: Other (comment) ([facility])  Provider Location: [facility] Assessment Services   Collateral Involvement: Call placed to Lorraine Bryant with pt's verbal permission. Lorraine Bryant was the petitioner for the IVC.   Does Patient Have a Automotive engineer Guardian? No data recorded Name and Contact of Legal Guardian: No data recorded If Minor and Not Living with Parent(s), Who has Custody? No data recorded Is CPS involved or ever been involved? -- (uta)  Is APS involved or ever been involved? -- Rich Reining)   Patient Determined To Be At Risk for Harm To Self or Others Based on Review of Patient Reported Information or Presenting Complaint? No (Per Shuvon Rankin NP, Pt is psych cleared. Recommend contacting her psychiatrist's office for assistance  and instruction.)  Method: No data recorded Availability of Means: No data recorded Intent: No data recorded Notification Required: No data recorded Additional Information for Danger to Others Potential: No data recorded Additional Comments for Danger to Others Potential: No data recorded Are There Guns or Other Weapons in Your Home? No data recorded Types of Guns/Weapons: No data recorded Are These Weapons Safely Secured?                            No data recorded Who Could Verify You Are Able To Have These Secured: No data recorded Do You Have any Outstanding Charges, Pending Court Dates, Parole/Probation? No data recorded Contacted To Inform of Risk of Harm To Self or Others: No data recorded   Does Patient Present under Involuntary Commitment? Yes  IVC Papers Initial File Date: 08/07/21   [place] of Residence: [place]   Patient Currently Receiving the Following Services: Medication Management   Determination of Need: Routine (7 days) (Per [employer] Rankin NP, Pt is psych cleared. Recommend contacting her psychiatrist's office for assistance and instruction.)   Options For Referral: Outpatient Therapy     CCA Biopsychosocial Patient Reported Schizophrenia/Schizoaffective Diagnosis in Past: Yes   Strengths: uta   Mental Health Symptoms Depression:   Tearfulness   Duration of Depressive symptoms:  Duration of Depressive Symptoms: Less than two weeks   Mania:   Increased Energy; Irritability; Recklessness   Anxiety:    Irritability; Restlessness   Psychosis:   Delusions   Duration of Psychotic symptoms:  Duration of Psychotic Symptoms: Less than six months (Pt has some possible delusions of her family in her home is "moving my stuff around.")   Trauma:   None   Obsessions:   None   Compulsions:   None   Inattention:   None   Hyperactivity/Impulsivity:   None   Oppositional/Defiant Behaviors:   None   Emotional Irregularity:   Mood  lability   Other Mood/Personality Symptoms:   uta    Mental Status Exam Appearance and self-care  Stature:   Tall   Weight:   Overweight   Clothing:   Casual   Grooming:   Normal   Cosmetic use:   Age appropriate   Posture/gait:   Normal   Motor activity:   Not Remarkable   Sensorium  Attention:   Normal   Concentration:   Normal   Orientation:   Person; Place; Situation; Time   Recall/memory:   Normal   Affect and Mood  Affect:   Full Range; Appropriate   Mood:   Euthymic  Relating  Eye contact:   Normal   Facial expression:   Responsive   Attitude toward examiner:   Cooperative   Thought and Language  Speech flow:  Clear and Coherent   Thought content:   Appropriate to Mood and Circumstances   Preoccupation:   None   Hallucinations:   None (Denied)   Organization:  No data recorded  Executive Functions  Fund of Knowledge:   Fair Affiliated Computer Servicesage   Abstraction:   Functional   Judgement:   Fair   Dance movement psychotherapist:   Adequate   Insight:   Gaps   Decision Making:   Confused   Social Functioning  Social Maturity:   Impulsive   Social Judgement:   Normal   Stress  Stressors:   Family conflict; Housing; Relationship   Coping Ability:   Overwhelmed; Exhausted   Skill Deficits:   Interpersonal; Decision making; Self-care   Supports:   Family; Friends/Service system; Support needed     Religion: Religion/Spirituality Are You A Religious Person?: Yes What is Your Religious Affiliation?: Christian  Leisure/Recreation: Leisure / Recreation Do You Have Hobbies?: No  Exercise/Diet: Exercise/Diet Do You Exercise?: No Have You Gained or Lost A Significant Amount of Weight in the Past Six Months?: No Do You Follow a Special Diet?: No Do You Have Any Trouble Sleeping?: No (Pt stated that she sleeps about 7 hours per night. Lorraine Bryant stated that pt does not sleep well recently. 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?: No  Education: Education Is Patient Currently Attending School?: No Did Theme park manager?: Yes What Type of College Degree Do you Have?: BSW Did You Have An Individualized Education Program (IIEP): No Did You Have Any Difficulty At School?: No   CCA Family/Childhood History Family and Relationship History: Family history Marital status: Single Does patient have children?: No  Childhood History:  Childhood History By whom was/is the patient raised?: Lorraine Bryant Did patient suffer any verbal/emotional/physical/sexual abuse as a child?: No Did patient suffer from severe childhood neglect?: No Has patient ever been sexually abused/assaulted/raped as an adolescent or adult?: No Was the patient ever a victim of a crime or a disaster?: No Witnessed domestic violence?: No Has patient been affected by domestic violence as an adult?: No  Child/Adolescent Assessment:     CCA Substance Use Alcohol/Drug Use: Alcohol / Drug Use Pain Medications: see MAR Prescriptions: see MAR Over the Counter: see MAR History of alcohol / drug use?: No history of alcohol / drug abuse                         ASAM's:  Six Dimensions of Multidimensional Assessment  Dimension 1:  Acute Intoxication and/or Withdrawal Potential:      Dimension 2:  Biomedical Conditions and Complications:      Dimension 3:  Emotional, Behavioral, or Cognitive Conditions and Complications:     Dimension 4:  Readiness to Change:     Dimension 5:  Relapse, Continued use, or Continued Problem Potential:     Dimension  6:  Recovery/Living Environment:  ASAM Severity Score:    ASAM Recommended Level of Treatment:     Substance use Disorder (SUD)    Recommendations for Services/Supports/Treatments:    Discharge Disposition:    DSM5 Diagnoses: Patient Active Problem List   Diagnosis Date Noted   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 Jimmye Norman, MS, [facility], [facility] Triage Specialist [facility]

## 2021-08-07 NOTE — ED Notes (Signed)
Pt's belongings locked at nurses station

## 2021-08-07 NOTE — Discharge Instructions (Signed)
Follow-up with your psychiatry team.  Return to the ED at any time for any worsening symptoms.

## 2021-08-07 NOTE — ED Notes (Signed)
Spoke with Norva Pavlov regarding delay in having update on dispo for pt. She is following up to try and update the situation.

## 2021-08-07 NOTE — ED Provider Notes (Signed)
[facility] EMERGENCY DEPT Provider Note   CSN: [id number] Arrival date & time: 08/06/21  2155     History Chief Complaint  Patient presents with   Anxiety    Lorraine Bryant is a 35 y.o. female.  35 yo F with a chief complaints of feeling like people have been messing with her stuff at home.  She tells me that she knows the people been messing with it.  She is here for help with that.  She denies any other complaint.  Denies SI or HI.  Denies chest pain shortness of breath abdominal pain nausea vomiting diarrhea.  The history is provided by the patient.  Anxiety Pertinent negatives include no chest pain, no headaches and no shortness of breath.  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 Cleared by endocrinology per patient.    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   Schizophrenia, paranoid (HCC) 08/21/2017    Past Surgical History:  Procedure Laterality Date   NO PAST SURGERIES       OB History     Gravida  0   Para  0   Term  0   Preterm  0   AB  0   Living  0      SAB  0   IAB  0   Ectopic  0   Multiple  0   Live Births  0           Family History  Problem Relation Age of Onset   Drug abuse Father    Schizophrenia Maternal Aunt     Social History   Tobacco Use   Smoking status: Never   Smokeless tobacco:  Never  Vaping Use   Vaping Use: Never used  Substance Use Topics   Alcohol use: Yes    Comment: occa   Drug use: No    Comment: unable to assess d/t pt not responding to questioning    Home Medications Prior to Admission medications   Medication Sig Start Date End Date Taking? Authorizing Provider  busPIRone (BUSPAR) 5 MG tablet Take 1 tablet (5 mg total) by mouth daily. Patient not taking: Reported on 05/09/2021 11/08/20 11/08/21  Cleotis Nipper, MD  hydrOXYzine (VISTARIL) 25 MG capsule Take 1 capsule (25 mg total) by mouth at bedtime as needed. 07/04/21   Arfeen, Phillips Grout, MD  paliperidone (INVEGA) 9 MG 24 hr tablet Take 1 tablet (9 mg total) by mouth daily. 07/04/21   Arfeen, Phillips Grout, MD    Allergies    Abilify [aripiprazole]  Review of Systems   Review of Systems  Constitutional:  Negative for chills and fever.  HENT:  Negative for congestion and rhinorrhea.   Eyes:  Negative for redness and visual disturbance.  Respiratory:  Negative for shortness of breath and wheezing.   Cardiovascular:  Negative for chest pain and palpitations.  Gastrointestinal:  Negative for nausea and vomiting.  Genitourinary:  Negative for dysuria and urgency.  Musculoskeletal:  Negative for arthralgias and myalgias.  Skin:  Negative for pallor and wound.  Neurological:  Negative for dizziness and headaches.  Psychiatric/Behavioral:  Positive for hallucinations. The patient is nervous/anxious.    Physical Exam Updated Vital Signs BP (!) 140/103 (BP Location: Right Arm)    Pulse 97    Temp 98.1 F (36.7 C)    Resp (!) 22    Ht 5\' 3"  (1.6 m)    Wt 95.3 kg    SpO2 100%    BMI 37.22 kg/m   Physical Exam Vitals and nursing note reviewed.  Constitutional:      General: She is not in acute distress.    Appearance: She is well-developed. She is not diaphoretic.  HENT:     Head: Normocephalic and atraumatic.  Eyes:     Pupils: Pupils are equal, round, and reactive to light.  Cardiovascular:     Rate  and Rhythm: Normal rate and regular rhythm.     Heart sounds: No murmur heard.   No friction rub. No gallop.  Pulmonary:     Effort: Pulmonary effort is normal.     Breath sounds: No wheezing or rales.  Abdominal:     General: There is no distension.     Palpations: Abdomen is soft.     Tenderness: There is no abdominal tenderness.  Musculoskeletal:        General: No tenderness.     Cervical back: Normal range of motion and neck supple.  Skin:    General: Skin is warm and dry.  Neurological:     Mental Status: She is alert and oriented to person, place, and time.  Psychiatric:        Mood and Affect: Mood is anxious.        Behavior: Behavior normal.        Thought Content: Thought content is paranoid. Thought content does not include homicidal or suicidal ideation. Thought content does not include homicidal or suicidal plan.    ED Results / Procedures / Treatments   Labs (all labs ordered are listed, but only abnormal results are displayed) Labs Reviewed  RESP PANEL BY RT-PCR (FLU A&B, COVID) [id number]  PREGNANCY, URINE    EKG None  Radiology No results found.  Procedures Procedures   Medications Ordered in ED Medications - No data to display  ED Course  I have reviewed the triage vital signs and the nursing notes.  Pertinent labs & imaging results that were available during my care of the patient were reviewed by me and considered in my medical decision making (see chart for details).    MDM Rules/Calculators/A&P                         35 yo F here because she is concerned that people been messing with her stuff.  She has trouble explaining this to me.  As she was here waiting to be seen her mom had reportedly filled out involuntary commitment paperwork on her.  We will have TTS evaluate.  I feel she is medically clear.   The patients results and plan were reviewed and discussed.   Any x-rays performed were independently reviewed by myself.   Differential  diagnosis were considered with the presenting HPI.  Medications - No data to display  Vitals:   08/06/21 2217 08/06/21 2218  BP:  08/08/21)  140/103  Pulse:  97  Resp:  (!) 22  Temp:  98.1 F (36.7 C)  SpO2:  100%  Weight: 95.3 kg   Height: 5\' 3"  (1.6 m)     Final diagnoses:  Paranoia [facility])        Final Clinical Impression(s) / ED Diagnoses Final diagnoses:  Paranoia [facility])    Rx / DC Orders ED Discharge Orders     None        [facility], DO 08/07/21 [phone]

## 2021-08-07 NOTE — ED Notes (Signed)
Pt placed in IVC designated scrubs and wanded by security

## 2021-08-08 ENCOUNTER — Telehealth (HOSPITAL_COMMUNITY): Payer: Self-pay

## 2021-08-08 NOTE — Telephone Encounter (Signed)
error 

## 2021-08-11 ENCOUNTER — Other Ambulatory Visit: Payer: Self-pay

## 2021-08-11 ENCOUNTER — Emergency Department (HOSPITAL_COMMUNITY)
Admission: EM | Admit: 2021-08-11 | Discharge: 2021-08-11 | Disposition: A | Discharge status: Home / Self Care | Payer: Medicaid Other | Attending: Emergency Medicine | Admitting: Emergency Medicine

## 2021-08-11 ENCOUNTER — Encounter (HOSPITAL_COMMUNITY): Payer: Self-pay | Admitting: Emergency Medicine

## 2021-08-11 DIAGNOSIS — Z046 Encounter for general psychiatric examination, requested by authority: Secondary | ICD-10-CM | POA: Insufficient documentation

## 2021-08-11 DIAGNOSIS — F209 Schizophrenia, unspecified: Secondary | ICD-10-CM | POA: Diagnosis not present

## 2021-08-11 DIAGNOSIS — Z008 Encounter for other general examination: Secondary | ICD-10-CM

## 2021-08-11 NOTE — ED Notes (Signed)
Pt standing by bathroom, does not want to get in the bed.

## 2021-08-11 NOTE — ED Triage Notes (Signed)
Pt arrives w/ [facility] under IVC placed by mom. Pt hx of schizophrenia and anxiety. Pt recently released from [facility].
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
## 2021-08-11 NOTE — ED Notes (Signed)
Moms phone number [phone]

## 2021-08-11 NOTE — ED Notes (Signed)
2 belonging bags placed above 19-22 cabinets.

## 2021-08-13 ENCOUNTER — Ambulatory Visit (HOSPITAL_COMMUNITY)
Admission: EM | Admit: 2021-08-13 | Discharge: 2021-08-13 | Disposition: A | Discharge status: Home / Self Care | Payer: Medicaid Other | Attending: Nurse Practitioner | Admitting: Nurse Practitioner

## 2021-08-13 ENCOUNTER — Other Ambulatory Visit (HOSPITAL_COMMUNITY): Payer: Self-pay | Admitting: Psychiatry

## 2021-08-13 DIAGNOSIS — F419 Anxiety disorder, unspecified: Secondary | ICD-10-CM

## 2021-08-13 DIAGNOSIS — F32A Depression, unspecified: Secondary | ICD-10-CM | POA: Insufficient documentation

## 2021-08-13 DIAGNOSIS — F200 Paranoid schizophrenia: Secondary | ICD-10-CM | POA: Insufficient documentation

## 2021-08-13 MED ORDER — LORAZEPAM 0.5 MG PO TABS
0.2500 mg | ORAL_TABLET | Freq: Once | ORAL | Status: AC
  Administered 2021-08-13: 22:00:00 0.25 mg via ORAL
  Filled 2021-08-13: qty 1

## 2021-08-13 MED ORDER — LORAZEPAM 0.5 MG PO TABS: 0.2500 mg | ORAL_TABLET | Freq: Two times a day (BID) | ORAL | 0 refills | Status: DC | PRN

## 2021-08-13 NOTE — ED Notes (Signed)
Patient and mom given discharge instructions - verbalizes understanding

## 2021-08-13 NOTE — Discharge Instructions (Addendum)
°  Discharge recommendations:  Patient is to take medications as prescribed. Please see information for follow-up appointment with psychiatry and therapy. Please follow up with your primary care provider for all medical related needs.   Therapy: We recommend that patient participate in individual therapy to address mental health concerns.  Medications: The patient is to contact a medical professional and/or outpatient provider to address any new side effects that develop. Patient should update outpatient providers of any new medications and/or medication changes.   Atypical antipsychotics: If you are prescribed an atypical antipsychotic, it is recommended that your height, weight, BMI, blood pressure, fasting lipid panel, and fasting blood sugar be monitored by your outpatient providers.  Safety:  The patient should abstain from use of illicit substances/drugs and abuse of any medications. If symptoms worsen or do not continue to improve or if the patient becomes actively suicidal or homicidal then it is recommended that the patient return to the closest hospital emergency department, the [facility], or call 911 for further evaluation and treatment. National Suicide Prevention Lifeline 1-800-SUICIDE or 718-299-8459.  About 988 988 offers 24/7 access to trained crisis counselors who can help people experiencing mental health-related distress. People can call or text 988 or chat 988lifeline.org for themselves or if they are worried about a loved one who may need crisis support.   __________________________ Therapy Walk-in Hours  Monday-Wednesday: 8 AM until slots are full  Friday: 1 PM to 5 PM  For Monday to Wednesday, it is recommended that patients arrive between 7:30 AM and 7:45 AM because patients will be seen in the order of arrival.  For Friday, we ask that patients arrive between 12 PM to 12:30 PM.  Go to the second floor on arrival and check  in.  **Availability is limited; therefore, patients may not be seen on the same day.**  Medication management walk-ins:  Monday to Friday: 8 AM to 11 AM.  It is recommended that patients arrive by 7:30 AM to 7:45 AM because patients will be seen in the order of arrival.  Go to the second floor on arrival and check in.  **Availability is limited; therefore, patients may not be seen on the same day.**

## 2021-08-13 NOTE — Progress Notes (Signed)
Pt was brought in by her mother.  Pt has had periods of restlessness.  She will get agitated and take awhile to calm.  At this time patient denies any SI, HI or A/V hallucinations.  Pt takes Invega and hydroxyzine.  She has not been taking the hydroxyzine because of how it makes her feel.  Pt is interested in Zoloft for depression.  Patient was seen by both this clinician and Nira Conn, FNP.  Barbara Cower did the MSE.  Pt feels safe to return home tonight.  Pt will follow up with Dr. Lolly Mustache for her scheduled appointment.  Pt does not meet inpatient care criteria at this time.
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
## 2021-08-14 ENCOUNTER — Telehealth (HOSPITAL_COMMUNITY): Payer: Self-pay | Admitting: *Deleted

## 2021-08-14 NOTE — Telephone Encounter (Signed)
She does not need Zoloft. Please check with her mother, If not taking Invega regularly than consider Invega injection.

## 2021-08-14 NOTE — Telephone Encounter (Signed)
Pt mother called and remains concerned and frustrated about pt's medications and pt's increasingly labile behaviors. Pt has been seen in ED 4 times since October for increasing paranoia. Per last ED note pt is fixated on wanting to start on Zoloft. Pt has an appointment upcoming on 08/30/21. Please review and advise.

## 2021-08-15 ENCOUNTER — Telehealth (HOSPITAL_COMMUNITY): Payer: Self-pay | Admitting: *Deleted

## 2021-08-15 NOTE — Telephone Encounter (Signed)
Writer spoke with pt's mother, Efraim Kaufmann, regarding pt medication and recent changes in behavior that mother believes is being caused by the Invega 9 mg 24 hr. Tab. Mother denies that pt has been non compliant with medication stating that she administers the medication to pt and watches her take it. Pt was heard to be present in the background. They are requesting a t/c from you to mothers phone #[phone]. Please review. Thanks!

## 2021-08-23 ENCOUNTER — Telehealth (HOSPITAL_COMMUNITY): Payer: Self-pay | Admitting: Physician Assistant

## 2021-08-23 NOTE — BH Assessment (Signed)
Care Management - [facility] Follow Up Discharges   Writer attempted to make contact with patient today and was unsuccessful.  Writer left a HIPPA compliant voice message.   Per chart review, patient will follow up with established provider (Dr. Arfeen).  

## 2021-08-30 ENCOUNTER — Other Ambulatory Visit: Payer: Self-pay

## 2021-08-30 ENCOUNTER — Ambulatory Visit (HOSPITAL_BASED_OUTPATIENT_CLINIC_OR_DEPARTMENT_OTHER): Payer: Medicaid Other | Admitting: Psychiatry

## 2021-08-30 ENCOUNTER — Encounter (HOSPITAL_COMMUNITY): Payer: Self-pay | Admitting: Psychiatry

## 2021-08-30 VITALS — Wt 210.0 lb

## 2021-08-30 DIAGNOSIS — F200 Paranoid schizophrenia: Secondary | ICD-10-CM

## 2021-08-30 DIAGNOSIS — F419 Anxiety disorder, unspecified: Secondary | ICD-10-CM | POA: Diagnosis not present

## 2021-08-30 MED ORDER — PALIPERIDONE ER 9 MG PO TB24: 9.0000 mg | ORAL_TABLET | Freq: Every day | ORAL | 0 refills | Status: DC

## 2021-08-30 MED ORDER — GABAPENTIN 100 MG PO CAPS: 100.0000 mg | ORAL_CAPSULE | Freq: Two times a day (BID) | ORAL | 2 refills | Status: DC

## 2021-08-30 NOTE — Progress Notes (Signed)
[place] MD Outpatient Follow up Note  Location: Patient: Office Provider: Office   I discussed the limitations, risks, security and privacy concerns of performing an evaluation and management service by telephone and the availability of in person appointments. I also discussed with the patient that there may be a patient responsible charge related to this service. The patient expressed understanding and agreed to proceed.   History of Present Illness: Patient came for her follow-up appointment with her mother.  She requested an earlier appointment.  Her mother is very concerned about her behavior.  She is in the ER 3 times in the past few months.  Mother reported she had episodes when she believed patient do not act right and get religiously preoccupied.  Mother has to called police and daughter is not happy when mother called police.  Patient reported that she does not want to see the grandparents and that he used to triggers.  Patient is upset with her mother that she is forcing her to see grandparents.  However now they have moved to a new place and there is less contact with the grandparents.  Mother also concerned that the patient still have delusions about pregnancy.  She has tested negative for the pregnancy.  Patient though she had sex few times with a boyfriend and she was nervous and anxious.  Mother concerned that she is not sleeping on time and patient admitted that she is sleeping late because she feels sometimes anxious and nervous.  When I ask patient why she does not want to see grandparents especially grandfather, patient replied that she believes some time her grandfather was as able thoughts and she here from the got that not to talk to them.  However she denies any abuse from the grandparents.  Patient understand that she had a chronic condition and that the big news she had limited insight because she may get pregnant but now realized given the chronic illness she need to be on psychotropic  medication for a while.  She was insisting Zoloft because she thought Zoloft helped her paranoia.  Now she is open to try something to help her anxiety.  Patient and mother reported that taking the Invega on a regular basis.  Patient also endorsed spending too much time on phone and social media and watching YouTube longer who takes the Zoloft.  She gets sometimes irritable but the mother denies any suicidal thoughts or homicidal thought.  She denies any aggressive behavior.  Her appetite is okay.  Recently she had a visit with her PCP and again she had an ultrasound and pregnancy test which was negative.  She is not drinking or using any illegal substances.  She is not taking hydroxyzine because it is making her too sleepy.  She was also given Ativan but after a while she felt it is causing reverse reaction.     Past Psychiatric History:  H/O inpatient at [facility], [facility] and [facility] Tried trazodone, Vistaril, Cogentin, Prolixin inj, Haldol, Abilify, olanzapine, Tegretol, [place] and [place].  Abilify caused restless, Latuda made nausea and olanzapine caused weight gain.    Recent Results (from the past 2160 hour(s))  Resp Panel by RT-PCR (Flu A&B, Covid) Nasopharyngeal Swab     Status: None   Collection Time: 08/07/21  3:29 AM   Specimen: Nasopharyngeal Swab; Nasopharyngeal(NP) swabs in vial transport medium  Result Value Ref Range   SARS Coronavirus 2 by RT PCR NEGATIVE NEGATIVE    Comment: (NOTE) SARS-CoV-2 target nucleic  acids are NOT DETECTED.  The SARS-CoV-2 RNA is generally detectable in upper respiratory specimens during the acute phase of infection. The lowest concentration of SARS-CoV-2 viral copies this assay can detect is 138 copies/mL. A negative result does not preclude SARS-Cov-2 infection and should not be used as the sole basis for treatment or other patient management decisions. A negative result may occur with  improper specimen collection/handling,  submission of specimen other than nasopharyngeal swab, presence of viral mutation(s) within the areas targeted by this assay, and inadequate number of viral copies(<138 copies/mL). A negative result must be combined with clinical observations, patient history, and epidemiological information. The expected result is Negative.  Fact Sheet for Patients:  EntrepreneurPulse.com.au  Fact Sheet for Healthcare Providers:  IncredibleEmployment.be  This test is no t yet approved or cleared by the [place] FDA and  has been authorized for detection and/or diagnosis of SARS-CoV-2 by FDA under an Emergency Use Authorization (EUA). This EUA will remain  in effect (meaning this test can be used) for the duration of the COVID-19 declaration under Section 564(b)(1) of the Act, 21 U.S.C.section 360bbb-3(b)(1), unless the authorization is terminated  or revoked sooner.       Influenza A by PCR NEGATIVE NEGATIVE   Influenza B by PCR NEGATIVE NEGATIVE    Comment: (NOTE) The Xpert Xpress SARS-CoV-2/FLU/RSV plus assay is intended as an aid in the diagnosis of influenza from Nasopharyngeal swab specimens and should not be used as a sole basis for treatment. Nasal washings and aspirates are unacceptable for Xpert Xpress SARS-CoV-2/FLU/RSV testing.  Fact Sheet for Patients: EntrepreneurPulse.com.au  Fact Sheet for Healthcare Providers: IncredibleEmployment.be  This test is not yet approved or cleared by the [place] FDA and has been authorized for detection and/or diagnosis of SARS-CoV-2 by FDA under an Emergency Use Authorization (EUA). This EUA will remain in effect (meaning this test can be used) for the duration of the COVID-19 declaration under Section 564(b)(1) of the Act, 21 U.S.C. section 360bbb-3(b)(1), unless the authorization is terminated or revoked.  Performed at [facility], [address] [place], [place] [address]      Psychiatric Specialty Exam: Physical Exam  Review of Systems  Weight 210 lb (95.3 kg).There is no height or weight on file to calculate BMI.  General Appearance: Casual  Eye Contact:  Fair  Speech:  Slow  Volume:  Decreased  Mood:  Anxious  Affect:  Congruent  Thought Process:  Descriptions of Associations: Intact  Orientation:  Full (Time, Place, and Person)  Thought Content:  Ideas of Reference:   Paranoia Delusions and Rumination  Suicidal Thoughts:  No  Homicidal Thoughts:  No  Memory:  Immediate;   Good Recent;   Good Remote;   Good  Judgement:  Fair  Insight:  Shallow  Psychomotor Activity:  Normal  Concentration:  Concentration: Fair and Attention Span: Fair  Recall:  [place] of Knowledge:  Good  Language:  Good  Akathisia:  No  Handed:  Right  AIMS (if indicated):     Assets:  Communication Skills Desire for Improvement Housing Social Support  ADL's:  Intact  Cognition:  WNL  Sleep:   going late to sleep      Assessment and Plan: Schizophrenia chronic paranoid type.  Anxiety.  I reviewed blood work results, current medication, and I have a long and lengthy discussion with the patient and her mother.  We talk about issues causing strain in his relationship between them.  Mother  admitted that she will not force her to see the grandparents but patient will also not stop her visiting mother to see grandparents.  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  Patient understand even though she like to have kids but at this time avoid getting pregnant as she is taking medication but is important for her chronic mental illness.  We talk about risk of noncompliance with medication in the past resulting multiple inpatient.  Patient's mother and patient, goal is to avoid hospitalization and she agreed that she will work on sleep hygiene to go bed early, not to go on social media or  stay late on the phone, regular walking and exercising and taking the Invega 9 mg 2 hours before sleep.  She also agreed to see a therapist and had appointment in February to see Sanjuana Kava.  Her blood test reviewed.  Her pregnancy test is negative.  Patient understand that could be her delusion that she thought about positive pregnancy.  We talk about trying gabapentin 100 mg up to 2 times a day to help her anxiety and sleep since patient not taking hydroxyzine.  Patient agreed to give a try.  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  We also discussed the use emergency services/911 if patient is in danger or other than having argument between family members.  Discussed to call us if she has any question or any concern.  Discussed safety concerns and anytime having active suicidal thoughts, homicidal thoughts then call 911.  Follow up in 4 weeks.   Follow Up Instructions:    I discussed the assessment and treatment plan with the patient. The patient was provided an opportunity to ask questions and all were answered. The patient agreed with the plan and demonstrated an understanding of the instructions.   The patient was advised to call back or seek an in-person evaluation if the symptoms worsen or if the condition fails to improve as anticipated.  I provided 32 minutes of non-face-to-face time during this encounter.   Kathlee Nations, MD

## 2021-09-26 ENCOUNTER — Ambulatory Visit (INDEPENDENT_AMBULATORY_CARE_PROVIDER_SITE_OTHER): Payer: Medicaid Other | Admitting: Clinical

## 2021-09-26 ENCOUNTER — Other Ambulatory Visit: Payer: Self-pay

## 2021-09-26 DIAGNOSIS — F419 Anxiety disorder, unspecified: Secondary | ICD-10-CM | POA: Diagnosis not present

## 2021-09-26 DIAGNOSIS — F200 Paranoid schizophrenia: Secondary | ICD-10-CM

## 2021-09-26 NOTE — Plan of Care (Signed)
Pt will develop healthy coping strategies to manage mood such as journaling 2/7 days per week at least entry each day. Pt participated in completion of plan.

## 2021-09-26 NOTE — Progress Notes (Signed)
° °  THERAPIST PROGRESS NOTE  Session Time: 1pn  Participation Level: Active  Behavioral Response: Casual and NeatAlertEuthymic guarded at times  Type of Therapy: Individual Therapy  Treatment Goals addressed: Coping  Interventions: Supportive  Summary: Pt presents for initial visit accompanied by her mother. CSW asked to speak with mother to gather collateral information but pt declined. Pt became guarded when discussing her most recent hospitalization at [facility]. Pt denies any hx of hallucinations or paranoia; chart review states pt has hx of paranoid schizophrenia and anxiety. Pt endorses anxiety and states she copes with anxiety by drinking hot tea, taking a nap and taking sunny mood vitamin pill. Also, pt reports she is prescribed invega pill. This week, pt states she was up for two days "praying to God about my internal battle" Pt displays ideas of reference(paranoia, delusions, ruminating thoughts about God).  Suicidal/Homicidal: Pt denies SI/HI. Pt denies Athalia. Denies plan, intent, attempt to harm self or others.  Therapist Response: initial session with pt, worked to establish rapport. Assessed for hobbies/interests. Assessed for changes in mood, behavior and daily functioning. Assessed for any safety concerns. Assisted pt in identifying treatment goal and coping strategies to manage anxiety.  Plan: Return again in 2 weeks.  Diagnosis: Axis I: Paranoid schizophrenia    Anxiety     Axis II: No diagnosis    Yvette Rack, LCSW 09/26/2021

## 2021-10-03 ENCOUNTER — Telehealth (HOSPITAL_COMMUNITY): Payer: Medicaid Other | Admitting: Psychiatry

## 2021-10-04 ENCOUNTER — Other Ambulatory Visit: Payer: Self-pay

## 2021-10-04 ENCOUNTER — Ambulatory Visit (INDEPENDENT_AMBULATORY_CARE_PROVIDER_SITE_OTHER): Payer: Medicaid Other | Admitting: Clinical

## 2021-10-04 DIAGNOSIS — F419 Anxiety disorder, unspecified: Secondary | ICD-10-CM | POA: Diagnosis not present

## 2021-10-04 DIAGNOSIS — F200 Paranoid schizophrenia: Secondary | ICD-10-CM | POA: Diagnosis not present

## 2021-10-04 NOTE — Progress Notes (Signed)
° °  THERAPIST PROGRESS NOTE  Session Time: 4pm  Participation Level: Active  Behavioral Response: Alert"Okay, maintaining"  Type of Therapy: Individual Therapy  Treatment Goals addressed:   ProgressTowards Goals: Initial  Interventions: CBT Virtual Visit via Telephone Note  I connected with Lorraine Bryant on 10/04/21 at  4:00 PM EST by telephone and verified that I am speaking with the correct person using two identifiers.  Location: Patient: home Provider: office   I discussed the limitations, risks, security and privacy concerns of performing an evaluation and management service by telephone and the availability of in person appointments. I also discussed with the patient that there may be a patient responsible charge related to this service. The patient expressed understanding and agreed to proceed.   I discussed the assessment and treatment plan with the patient. The patient was provided an opportunity to ask questions and all were answered. The patient agreed with the plan and demonstrated an understanding of the instructions.   The patient was advised to call back or seek an in-person evaluation if the symptoms worsen or if the condition fails to improve as anticipated.  I provided 45 minutes of non-face-to-face time during this encounter.  Summary: Pt experienced connection issue, session completed via phone. Pt states shared two journal entries where she discussed concern about uncertainty of her career and direction of life. Pt says this uncertainty has caused increased anxiety. 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 Pt expressed preference for a Marketing executive. Writer informed pt referrals can be made for christian counselor; pt declines at this time.  Suicidal/Homicidal: Pt denies SI/HI no plan,  intent or attempt to harm self or others reported. No AH/VH reported.  Therapist Response: Initial visit, worked to establish rapport. Discussed with pt what could happen vs what will happen to consider worry vs reality.   Plan: Return again in 2 weeks.  Diagnosis: Paranoid schizophrenia   Anxiety  Collaboration of Care: Other None requested  Patient/Guardian was advised Release of Information must be obtained prior to any record release in order to collaborate their care with an outside provider. Patient/Guardian was advised if they have not already done so to contact the registration department to sign all necessary forms in order for [place] to release information regarding their care.   Consent: Patient/Guardian gives verbal consent for treatment and assignment of benefits for services provided during this visit. Patient/Guardian expressed understanding and agreed to proceed.   Yvette Rack, LCSW 10/04/2021

## 2021-10-08 ENCOUNTER — Telehealth (HOSPITAL_COMMUNITY): Payer: Self-pay | Admitting: Clinical

## 2021-10-08 ENCOUNTER — Telehealth (HOSPITAL_COMMUNITY): Payer: Self-pay

## 2021-10-08 NOTE — Telephone Encounter (Signed)
Medication management - Telephone call back to patient's home with her Mother and patient there will her, to inform their message was received and would be sent to Dr. Lolly Mustache to see if he wants pt to have any labwork done prior to appointment 10/11/21. Collateral stated she knew patient had not had any labwork completed in the past year and thought Dr. Lolly Mustache may want her to have some prior to her scheduled appointment.  Informed Dr. Lolly Mustache would be back in the office on 10/09/21 so the message with request would be sent for him to review upon his return.

## 2021-10-09 ENCOUNTER — Other Ambulatory Visit (HOSPITAL_COMMUNITY): Payer: Self-pay | Admitting: *Deleted

## 2021-10-09 DIAGNOSIS — Z79899 Other long term (current) drug therapy: Secondary | ICD-10-CM

## 2021-10-09 DIAGNOSIS — F200 Paranoid schizophrenia: Secondary | ICD-10-CM

## 2021-10-09 NOTE — Telephone Encounter (Signed)
Ordered labs.VM left with pt mother, Efraim Kaufmann.

## 2021-10-09 NOTE — Telephone Encounter (Signed)
She will need CBC, CMP, HbA1c and prolactin level.

## 2021-10-11 ENCOUNTER — Other Ambulatory Visit: Payer: Self-pay

## 2021-10-11 ENCOUNTER — Ambulatory Visit (HOSPITAL_BASED_OUTPATIENT_CLINIC_OR_DEPARTMENT_OTHER): Payer: Medicaid Other | Admitting: Psychiatry

## 2021-10-11 ENCOUNTER — Encounter (HOSPITAL_COMMUNITY): Payer: Self-pay | Admitting: Psychiatry

## 2021-10-11 DIAGNOSIS — F200 Paranoid schizophrenia: Secondary | ICD-10-CM | POA: Diagnosis not present

## 2021-10-11 DIAGNOSIS — F419 Anxiety disorder, unspecified: Secondary | ICD-10-CM

## 2021-10-11 MED ORDER — PALIPERIDONE ER 9 MG PO TB24: 9.0000 mg | ORAL_TABLET | Freq: Every day | ORAL | 0 refills | Status: DC

## 2021-10-11 NOTE — Progress Notes (Signed)
[place] MD Outpatient Follow up Note  Location: Patient: Office Provider: Office   I discussed the limitations, risks, security and privacy concerns of performing an evaluation and management service by telephone and the availability of in person appointments. I also discussed with the patient that there may be a patient responsible charge related to this service. The patient expressed understanding and agreed to proceed.   History of Present Illness: Patient came for her follow-up appointment with her mother.  Her mother continued to endorse episodes of thought blocking, talking to herself and religiously preoccupied but no significant behavior problem.  She started therapy with Sanjuana Kava.  So far she has do appointments.  Patient told she preferred to have a younger therapist however did not specify why.  She is trying to do sleep hygiene and most of the nights she is sleeping better other than last night when she was up late.  Her mother has a lot of other complaints and concerns but patient refused to accept.  For example mother told that she get foggy but patient told that she tried to do meditation.  On 1 occasion mother tried to take the daughter to grandparents with her permission but patient replied it was not her permission.  There is a lot of contradict statements between mother and patient.  We have tried gabapentin but patient do not like as she feels more depressed and tired.  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  Mother reported there was a incident when they were in the restaurant and she saw the babies and after staring at the baby she started to have crying and get emotional.  However her daughter again denied that episode.  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  She is taking sleep vitamins at night.  She appeared somewhat guarded to express her  symptoms but denies any suicidal thoughts, anger, violence.  She admitted talked to the God that helps and calm her down.  She is taking Invega but resistant to increase the dose.  She is also not interested in injectables.    Past Psychiatric History:  H/O inpatient at [facility], old [place] and [facility]. Tried trazodone, Vistaril, Cogentin, Prolixin inj, Haldol, Abilify, olanzapine, Tegretol, [place] and [place].  Abilify caused restless, Latuda made nausea and olanzapine caused weight gain.    Recent Results (from the past 2160 hour(s))  Resp Panel by RT-PCR (Flu A&B, Covid) Nasopharyngeal Swab     Status: None   Collection Time: 08/07/21  3:29 AM   Specimen: Nasopharyngeal Swab; Nasopharyngeal(NP) swabs in vial transport medium  Result Value Ref Range   SARS Coronavirus 2 by RT PCR NEGATIVE NEGATIVE    Comment: (NOTE) SARS-CoV-2 target nucleic acids are NOT DETECTED.  The SARS-CoV-2 RNA is generally detectable in upper respiratory specimens during the acute phase of infection. The lowest concentration of SARS-CoV-2 viral copies this assay can detect is 138 copies/mL. A negative result does not preclude SARS-Cov-2 infection and should not be used as the sole basis for treatment or other patient management decisions. A negative result may occur with  improper specimen collection/handling, submission of specimen other than nasopharyngeal swab, presence of viral mutation(s) within the areas targeted by this assay, and inadequate number of viral copies(<138 copies/mL). A negative result must be combined with clinical observations, patient history, and epidemiological information. The  expected result is Negative.  Fact Sheet for Patients:  EntrepreneurPulse.com.au  Fact Sheet for Healthcare Providers:  IncredibleEmployment.be  This test is no t yet approved or cleared by the [place] FDA and  has been authorized for detection and/or  diagnosis of SARS-CoV-2 by FDA under an Emergency Use Authorization (EUA). This EUA will remain  in effect (meaning this test can be used) for the duration of the COVID-19 declaration under Section 564(b)(1) of the Act, 21 U.S.C.section 360bbb-3(b)(1), unless the authorization is terminated  or revoked sooner.       Influenza A by PCR NEGATIVE NEGATIVE   Influenza B by PCR NEGATIVE NEGATIVE    Comment: (NOTE) The Xpert Xpress SARS-CoV-2/FLU/RSV plus assay is intended as an aid in the diagnosis of influenza from Nasopharyngeal swab specimens and should not be used as a sole basis for treatment. Nasal washings and aspirates are unacceptable for Xpert Xpress SARS-CoV-2/FLU/RSV testing.  Fact Sheet for Patients: EntrepreneurPulse.com.au  Fact Sheet for Healthcare Providers: IncredibleEmployment.be  This test is not yet approved or cleared by the [place] FDA and has been authorized for detection and/or diagnosis of SARS-CoV-2 by FDA under an Emergency Use Authorization (EUA). This EUA will remain in effect (meaning this test can be used) for the duration of the COVID-19 declaration under Section 564(b)(1) of the Act, 21 U.S.C. section 360bbb-3(b)(1), unless the authorization is terminated or revoked.  Performed at [facility], [address], [place], [place] [address]     Psychiatric Specialty Exam: Physical Exam  Review of Systems  Blood pressure 115/82, pulse 81, temperature 98.3 F (36.8 C), temperature source Oral, height 5\' 3"  (1.6 m), weight 219 lb 3.2 oz (99.4 kg), SpO2 100 %.There is no height or weight on file to calculate BMI.  General Appearance: Casual and Guarded  Eye Contact:  Fair  Speech:  Slow  Volume:  Decreased  Mood:  Anxious  Affect:  Labile  Thought Process:  Descriptions of Associations: Intact  Orientation:  Full (Time, Place, and Person)  Thought Content:  Paranoid Ideation and  Rumination  Suicidal Thoughts:  No  Homicidal Thoughts:  No  Memory:  Immediate;   Fair Recent;   Fair Remote;   Fair  Judgement:  Fair  Insight:  Shallow  Psychomotor Activity:  Decreased  Concentration:  Concentration: Fair and Attention Span: Fair  Recall:  [employer] of Knowledge:  Fair  Language:  Fair  Akathisia:  No  Handed:  Right  AIMS (if indicated):     Assets:  Communication Skills Desire for Improvement Housing Social Support  ADL's:  Intact  Cognition:  WNL  Sleep:   fair     Assessment and Plan: Schizophrenia chronic paranoid type.  Anxiety.  Patient is still have resolved symptoms of paranoia and she gets religiously preoccupied and overwhelmed when see the babies.  She understand not to get pregnant because she is taking psychotropic medication.  She endorsed not sexually active and currently not in any relationship as taking some break from the relationship.  We will discontinue gabapentin as she did not feel good after taking the gabapentin.  Once again I recommend try higher dose of Invega but patient refused.  We discuss in detail with the mother and the patient that some time patient needs time to herself and rethink about her issues.  I recommend to mother that give her some space however if mother noticed that she is acting out or having severe anger, hallucination that is  out of control then she should call us immediately or take her to the emergency room.  I also talked to the patient that if she needs some time to herself or need meditation then need to inform the mother.  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  I also suggest she should give more time to therapist to work on her issues.  Mother is concerned that patient does not let her talk to therapist.  I suggested she can write a letter and drop at the front desk and give it a therapist before her next appointment about her concerns.  For now continue  Abilify 9 mg daily.  Discussed safety concerns and anytime having active suicidal thoughts or homicidal thought then she need to call 911 or go to local emergency room.  Follow-up in 2 months.  Follow Up Instructions:    I discussed the assessment and treatment plan with the patient. The patient was provided an opportunity to ask questions and all were answered. The patient agreed with the plan and demonstrated an understanding of the instructions.   The patient was advised to call back or seek an in-person evaluation if the symptoms worsen or if the condition fails to improve as anticipated.  I provided 32 minutes of non-face-to-face time during this encounter.   Kathlee Nations, MD

## 2021-10-12 ENCOUNTER — Ambulatory Visit (HOSPITAL_COMMUNITY): Payer: Medicaid Other | Admitting: Clinical

## 2021-10-12 ENCOUNTER — Telehealth (HOSPITAL_COMMUNITY): Payer: Self-pay | Admitting: Clinical

## 2021-10-15 ENCOUNTER — Ambulatory Visit (INDEPENDENT_AMBULATORY_CARE_PROVIDER_SITE_OTHER): Payer: Medicaid Other | Admitting: Clinical

## 2021-10-15 ENCOUNTER — Other Ambulatory Visit: Payer: Self-pay

## 2021-10-15 DIAGNOSIS — F419 Anxiety disorder, unspecified: Secondary | ICD-10-CM | POA: Diagnosis not present

## 2021-10-15 DIAGNOSIS — F200 Paranoid schizophrenia: Secondary | ICD-10-CM | POA: Diagnosis not present
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
## 2021-10-18 ENCOUNTER — Telehealth (HOSPITAL_COMMUNITY): Payer: Self-pay

## 2021-10-18 ENCOUNTER — Telehealth (HOSPITAL_COMMUNITY): Payer: Self-pay | Admitting: *Deleted

## 2021-10-18 NOTE — Telephone Encounter (Signed)
Pt and mother Efraim Kaufmann presented to office demanding that Dr. Lolly Mustache call her about "testing". Mother was unable to state what tests they wanted as pt ran out of office and Melissa followed behind. Approximately 45 minutes later mother presents again telling front desk it was "urgent". This nurse and CMA spoke with mother who stated that pt had "run off" while they were at [facility] and ran from office because she doesn't want genetics testing. Pt is telling mother that God is telling her to stay away from her mother. Writer advised mother to call [facility] to have pt picked up and possibly IVC'd. Mother refused as she says they have done that 3 times and hospital always releases pt to home. Mother asking for a t/c from you.  ?

## 2021-10-19 NOTE — Telephone Encounter (Signed)
error 

## 2021-10-19 NOTE — Telephone Encounter (Signed)
I tried to reach at [phone] and [phone] but no one pick up the phone.  I left a voicemail on these numbers.  I recently seen this patient and there is some disagreement between daughter and the mother.  This causes increased tension among themselves.  She is in the ER a few times.  I do believe she need a high level of care. ?

## 2021-11-01 ENCOUNTER — Other Ambulatory Visit: Payer: Self-pay

## 2021-11-01 ENCOUNTER — Telehealth (HOSPITAL_BASED_OUTPATIENT_CLINIC_OR_DEPARTMENT_OTHER): Payer: Medicaid Other | Admitting: Psychiatry

## 2021-11-01 ENCOUNTER — Encounter (HOSPITAL_COMMUNITY): Payer: Self-pay | Admitting: Psychiatry

## 2021-11-01 DIAGNOSIS — F419 Anxiety disorder, unspecified: Secondary | ICD-10-CM

## 2021-11-01 DIAGNOSIS — F200 Paranoid schizophrenia: Secondary | ICD-10-CM | POA: Diagnosis not present

## 2021-11-01 NOTE — Progress Notes (Addendum)
Virtual Visit via Telephone Note ? ?I connected with Lorraine Bryant on 11/01/21 at  9:20 AM EDT by telephone and verified that I am speaking with the correct person using two identifiers. ? ?Location: ?Patient: In Car ?Provider: Home Office ?  ?I discussed the limitations, risks, security and privacy concerns of performing an evaluation and management service by telephone and the availability of in person appointments. I also discussed with the patient that there may be a patient responsible charge related to this service. The patient expressed understanding and agreed to proceed. ? ? ?History of Present Illness: ?Patient requested an earlier appointment.  She was evaluated by phone.  She like to discuss genesight testing results which was done 1 week ago.  She continued to reported to have disagreement with her mother but things are now much better.  Her mother was also on the phone on the session.  I had discussed in detail in the previous session and again today that they need to have a better communication and again encourage to keep the appointment with a therapist for better coping skills.  Patient is now much calmer and agree to restart therapy with a therapist.  She is sleeping better.  During the session both daughter and mother were very calm and accepted the responsibilities.  She is taking the Invega 9 mg daily.  She is sleeping good.  We talk about their boundaries and limitations.  Patient's mother not insisting to take her to the grandparent's house.  Her mother also agree to consider opening the possibility of job at daycare.  Patient reported no agitation, anger, paranoia, suicidal thoughts. ? ?Past Psychiatric History:  ?H/O inpatient at [place], [facility] and [facility]. Tried trazodone, Vistaril, Cogentin, Prolixin inj, Haldol, Abilify, olanzapine, Tegretol, [place] and [place].  Abilify caused restless, Latuda made nausea and olanzapine caused weight gain.  ? ?Psychiatric  Specialty Exam: ?Physical Exam  ?Review of Systems  ?There were no vitals taken for this visit.There is no height or weight on file to calculate BMI.  ?General Appearance: NA  ?Eye Contact:  NA  ?Speech:  Slow  ?Volume:  Decreased  ?Mood:  Euthymic  ?Affect:  NA  ?Thought Process:  Descriptions of Associations: Intact  ?Orientation:  Full (Time, Place, and Person)  ?Thought Content:  Paranoid Ideation and Rumination  ?Suicidal Thoughts:  No  ?Homicidal Thoughts:  No  ?Memory:  Immediate;   Fair ?Recent;   Fair ?Remote;   Fair  ?Judgement:  Fair  ?Insight:  Shallow  ?Psychomotor Activity:  NA  ?Concentration:  Concentration: Fair and Attention Span: Fair  ?Recall:  Fair  ?Fund of Knowledge:  Fair  ?Language:  Fair  ?Akathisia:  No  ?Handed:  Right  ?AIMS (if indicated):     ?Assets:  Communication Skills ?Desire for Improvement ?Housing ?Social Support  ?ADL's:  Intact  ?Cognition:  WNL  ?Sleep:   ok  ?  ? ? ?Assessment and Plan: ?Schizophrenia chronic paranoid type.  Anxiety. ? ?I discussed the results of genesight test.  Her medications are in favorable column. since taking the medication her paranoia is better.  She is not anymore having arguments with the mother.  I do believe she need therapy and after some discussion she agreed to have in person therapy with Lowella Dandy.  We will not change the medication.  Continue Invega 9 mg daily.  Follow-up in 2 months.  I have discussed one of the treatment modality is to have a therapy to  help her coping skills and she needs to consider seriously.  We will refer her to Lowella Dandy in person visits. ? ?Follow Up Instructions: ? ?  ?I discussed the assessment and treatment plan with the patient. The patient was provided an opportunity to ask questions and all were answered. The patient agreed with the plan and demonstrated an understanding of the instructions. ?  ?The patient was advised to call back or seek an in-person evaluation if the symptoms worsen or if  the condition fails to improve as anticipated. ? ?I provided 16 minutes of non-face-to-face time during this encounter. ? ? ?Cleotis Nipper, MD  ?

## 2021-11-19 ENCOUNTER — Ambulatory Visit (HOSPITAL_COMMUNITY): Payer: Medicaid Other | Admitting: Clinical

## 2021-11-29 ENCOUNTER — Encounter (HOSPITAL_COMMUNITY): Payer: Self-pay | Admitting: Psychiatry

## 2021-11-29 ENCOUNTER — Ambulatory Visit (HOSPITAL_BASED_OUTPATIENT_CLINIC_OR_DEPARTMENT_OTHER): Payer: Medicaid Other | Admitting: Psychiatry

## 2021-11-29 DIAGNOSIS — F419 Anxiety disorder, unspecified: Secondary | ICD-10-CM | POA: Diagnosis not present

## 2021-11-29 DIAGNOSIS — F200 Paranoid schizophrenia: Secondary | ICD-10-CM | POA: Diagnosis not present

## 2021-11-29 MED ORDER — PALIPERIDONE ER 9 MG PO TB24: 9.0000 mg | ORAL_TABLET | Freq: Every day | ORAL | 0 refills | Status: DC

## 2021-11-29 NOTE — Progress Notes (Signed)
BH MD/PA/NP OP Progress Note ? ?11/29/2021 3:54 PM ?Lorraine Bryant  ?MRN:  [id number] ? ?Patient Location: In Person ?Provider Location: Office ? ? ? ?HPI: Patient came for her follow-up appointment in person with her mother.  She requested an earlier appointment because mother was concerned about her attitude.  When I asked the patient she reported no issues.  She is sleeping 9 hours.  She is walking every day.  She denies any hallucination or paranoia but is still have grandiosity.  Her mood is pleasant and her affect is labile.  Recently she had a visit with her PCP and prescribed vitamin D.  She takes occasionally melatonin 10 mg but most of the nights she sleeps good.  She like Invega.  Her mother is again concerned that patient does not see grandkids and explain it is up to her if she chooses not to visit her grandparents.  We again talked about the boundaries and limitation in the relationship.  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  Mother denies any agitation, aggression or worsening of symptoms of the patient.  Mother reported she is taking the medication every day and she is checking on her regularly.  Patient is not drinking or using any illegal substances.  She denies any panic attack.   ? ?Visit Diagnosis:  ?  ICD-10-CM   ?1. Anxiety  F41.9 paliperidone (INVEGA) 9 MG 24 hr tablet  ?  ?2. Paranoid schizophrenia (HCC)  F20.0 paliperidone (INVEGA) 9 MG 24 hr tablet  ?  ? ? ?Past Psychiatric History:  ?H/O inpatient at [place], [facility] [place] and [facility]. Tried trazodone, Vistaril, Cogentin, Prolixin inj, Haldol, Abilify, olanzapine, Tegretol, [place] and [place].  Abilify caused restless, Latuda made nausea and olanzapine caused weight gain.  ?  ? ?Past Medical History:  ?Past Medical History:  ?Diagnosis Date  ? Depression   ? 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 Likely d/t psych meds. Cleared by endocrinology per patient.   ?  Hallucinations   ? Paranoid schizophrenia (HCC) 07/15/2017  ? Psychosis (HCC)   ? per pt and family last year  ?  ?Past Surgical History:  ?Procedure Laterality Date  ? NO PAST SURGERIES    ? ? ?Family Psychiatric History: Reviewed.  ? ?Family History:  ?Family History  ?Problem Relation Age of Onset  ? Drug abuse Father   ? Schizophrenia Maternal Aunt   ? ? ?Social History:  ?Social History  ? ?Socioeconomic History  ? Marital status: Single  ?  Spouse name: Not on file  ? Number of children: Not on file  ? Years of education: Not on file  ? Highest education level: Not on file  ?Occupational History  ? Not on file  ?Tobacco Use  ? Smoking status: Never  ? Smokeless tobacco: Never  ?Vaping Use  ? Vaping Use: Never used  ?Substance and Sexual Activity  ? Alcohol use: Yes  ?  Comment: occa  ? Drug use: No  ?  Comment: unable to assess d/t pt not responding to questioning  ? Sexual activity: Never  ?Other Topics Concern  ? Not on file  ?Social History Narrative  ? Not on file  ? ?Social Determinants of Health  ? ?Financial Resource Strain: Not on file  ?Food Insecurity: Not on file  ?Transportation Needs: Not on file  ?Physical Activity: Not on file  ?Stress: Not on file  ?Social Connections: Not on file  ? ? ?  Allergies:  ?Allergies  ?Allergen Reactions  ? Abilify [Aripiprazole] Other (See Comments)  ?  Slow moving and hand cramping up  ? ? ?Metabolic Disorder Labs: ?Lab Results  ?Component Value Date  ? HGBA1C 6.0 (H) 09/04/2018  ? MPG 120 02/23/2018  ? ?Lab Results  ?Component Value Date  ? PROLACTIN 209.4 (H) 09/04/2018  ? PROLACTIN 137.1 (H) 02/23/2018  ? ?No results found for: CHOL, TRIG, HDL, CHOLHDL, VLDL, LDLCALC ?Lab Results  ?Component Value Date  ? TSH 2.170 09/04/2018  ? TSH 0.96 02/23/2018  ? ? ?Therapeutic Level Labs: ?No results found for: LITHIUM ?No results found for: VALPROATE ?No components found for:  CBMZ ? ?Current Medications: ?Current Outpatient Medications  ?Medication Sig Dispense Refill  ?  gabapentin (NEURONTIN) 100 MG capsule Take 1 capsule (100 mg total) by mouth 2 (two) times daily. (Patient not taking: Reported on 10/11/2021) 60 capsule 2  ? hydrOXYzine (VISTARIL) 25 MG capsule Take 1 capsule (25 mg total) by mouth at bedtime as needed. (Patient not taking: Reported on 08/30/2021) 30 capsule 1  ? LORazepam (ATIVAN) 0.5 MG tablet Take 0.5 tablets (0.25 mg total) by mouth 2 (two) times daily as needed for anxiety. (Patient not taking: Reported on 08/30/2021) 15 tablet 0  ? paliperidone (INVEGA) 9 MG 24 hr tablet Take 1 tablet (9 mg total) by mouth daily. 90 tablet 0  ? ?No current facility-administered medications for this visit.  ? ? ? ?Musculoskeletal: ?Strength & Muscle Tone: within normal limits ?Gait & Station: normal ?Patient leans: Right ? ?Psychiatric Specialty Exam: ?Review of Systems  ?Blood pressure 103/69, pulse 96, temperature 98.5 ?F (36.9 ?C), temperature source Temporal, height 5\' 3"  (1.6 m), weight 214 lb 9.6 oz (97.3 kg), SpO2 100 %.There is no height or weight on file to calculate BMI.  ?General Appearance: Casual  ?Eye Contact:  Fair  ?Speech:  Normal Rate  ?Volume:  Increased  ?Mood:   pleasant  ?Affect:  Labile  ?Thought Process:  Descriptions of Associations: Intact  ?Orientation:  Full (Time, Place, and Person)  ?Thought Content: WDL   ?Suicidal Thoughts:  No  ?Homicidal Thoughts:  No  ?Memory:  Immediate;   Fair ?Recent;   Fair ?Remote;   Fair  ?Judgement:  Intact  ?Insight:  Shallow  ?Psychomotor Activity:  Normal  ?Concentration:  Concentration: Fair and Attention Span: Fair  ?Recall:  Fair  ?Fund of Knowledge: Fair  ?Language: Good  ?Akathisia:  No  ?Handed:  Right  ?AIMS (if indicated): not done  ?Assets:  Communication Skills ?Desire for Improvement ?Housing ?Social Support  ?ADL's:  Intact  ?Cognition: WNL  ?Sleep:  Fair  ? ?Screenings: ?AIMS   ? ?Flowsheet Row [facility]  ?AIMS Total Score 0  ? ?   ? ?AUDIT   ? ?Flowsheet Row Ad[facility]  ?Alcohol Use Disorder Identification Test Final Score (AUDIT) 0  ? ?  ? ?GAD-7   ? ?Flowsheet Row [facility] from [facility]2017 in [facility]  ?Total GAD-7 Score 0 0  ? ?  ? ?PHQ2-9   ? ?Flowsheet Row Counselor from 09/26/2021 in [facility] [place] ED from 08/07/2021 in [facility] Office Visit from 12[facility]20/2019 [facility] Office Visit from 07/02/2017  in Center for [facility]  ?PHQ-2 Total Score 0 2 0 0  ?PHQ-9 Total Score -- 3 0 0  ? ?  ? ?Flowsheet Row Counselor from 09/26/2021 in BEHAVIORAL HEALTH OUTPATIENT THERAPY [place] ED from 08/11/2021 in [facility] from 08/07/2021 in [facility]  ?C-SSRS RISK CATEGORY No Risk No Risk No Risk  ? ?  ? ? ? ?Assessment and Plan: Schizophrenia chronic paranoid type.  Anxiety. ? ?Reassurance given and again emphasized boundaries and limitations of the relationship.  So far mother admitted patient is not aggressive or agitated but sometimes get irritable when she insists to see the grandkids.  I encourage considered therapy so far patient has not made appointment.  She recently saw PCP and I recommend to have her blood work results faxed to [place].  We will follow up in 2 months.  Continue Invega 9 mg daily.  Melatonin 10 mg as needed for insomnia. ? ?Collaboration of Care: Collaboration of Care: Primary Care Provider AEB ask patient to have her blood work results faxed to [place]. ? ?Patient/Guardian was advised Release of Information must be obtained prior to any record release in order to collaborate their care with an outside provider. Patient/Guardian was  advised if they have not already done so to contact the registration department to sign all necessary forms in order for [place] to release information regarding their care.  ? ?Consent: Patient/Guardian gives verbal c

## 2021-12-05 ENCOUNTER — Ambulatory Visit (HOSPITAL_COMMUNITY): Payer: Medicaid Other | Admitting: Clinical

## 2021-12-05 ENCOUNTER — Telehealth (HOSPITAL_COMMUNITY): Payer: Self-pay | Admitting: Clinical

## 2021-12-13 ENCOUNTER — Ambulatory Visit (HOSPITAL_COMMUNITY): Payer: Medicaid Other | Admitting: Psychiatry

## 2021-12-19 ENCOUNTER — Ambulatory Visit (HOSPITAL_COMMUNITY): Payer: Medicaid Other | Admitting: Clinical

## 2021-12-21 ENCOUNTER — Other Ambulatory Visit (HOSPITAL_COMMUNITY): Payer: Self-pay | Admitting: *Deleted

## 2021-12-21 ENCOUNTER — Telehealth (HOSPITAL_COMMUNITY): Payer: Self-pay | Admitting: *Deleted

## 2021-12-21 DIAGNOSIS — F200 Paranoid schizophrenia: Secondary | ICD-10-CM

## 2021-12-21 DIAGNOSIS — F419 Anxiety disorder, unspecified: Secondary | ICD-10-CM

## 2021-12-21 MED ORDER — PALIPERIDONE ER 3 MG PO TB24: 3.0000 mg | ORAL_TABLET | Freq: Every day | ORAL | 0 refills | Status: DC

## 2021-12-21 MED ORDER — PALIPERIDONE ER 6 MG PO TB24: 6.0000 mg | ORAL_TABLET | Freq: Every day | ORAL | 0 refills | Status: DC

## 2021-12-21 NOTE — Telephone Encounter (Signed)
Will do 1 time split tablet of Invega 6 mg and 3 mg bridge refill.  In the future she will get only 30-day refills. ?

## 2021-12-21 NOTE — Telephone Encounter (Signed)
Writer spoke to pt's mother, who manages pt's medications, who called and left several messages stating "it's an emergency". Writer returned call as soon as possible and mother stated that pt is out of [place] and needs a refill. Per EMR script for #90 was [facility] to [facility] [place], on 11/29/21. Writer then spoke with [facility]ist at [facility] who says there is a #90 Rx on hold but cannot be filled until 12/31/21. Mother denies that pt overusing the medication. Says they have looked everywhere and cannot find another bottle. Writer advised that they would have to pay out of pocket for #10 bridge. Mother asked if you would write for #10 of the 6 mg and #10 of the 3 mg, then fill the script on hold. Pt has an appointment scheduled for 01/31/22. Please review and advise. ?

## 2022-01-02 ENCOUNTER — Ambulatory Visit (HOSPITAL_COMMUNITY): Payer: Medicaid Other | Admitting: Clinical

## 2022-01-03 ENCOUNTER — Ambulatory Visit (HOSPITAL_COMMUNITY): Payer: Medicaid Other | Admitting: Psychiatry

## 2022-01-03 ENCOUNTER — Other Ambulatory Visit (HOSPITAL_COMMUNITY): Payer: Self-pay | Admitting: *Deleted

## 2022-01-04 ENCOUNTER — Telehealth (HOSPITAL_COMMUNITY): Payer: Self-pay | Admitting: *Deleted

## 2022-01-04 ENCOUNTER — Other Ambulatory Visit (HOSPITAL_COMMUNITY): Payer: Self-pay | Admitting: *Deleted

## 2022-01-04 MED ORDER — PALIPERIDONE ER 9 MG PO TB24: 9.0000 mg | ORAL_TABLET | ORAL | 0 refills | Status: DC

## 2022-01-04 NOTE — Telephone Encounter (Signed)
PA FOR INVEGA 9 MG TABLETS APPROVED BY [place] [employer]. (#30)   MEDICATION APPROVED FROM 01/04/22 THROUGH 12/30/22.  PA CONFIRMATION NUMBER IS [id number]  INTERACTION # [id number]

## 2022-01-05 ENCOUNTER — Other Ambulatory Visit (HOSPITAL_COMMUNITY): Payer: Self-pay | Admitting: Psychiatry

## 2022-01-05 DIAGNOSIS — F419 Anxiety disorder, unspecified: Secondary | ICD-10-CM

## 2022-01-31 ENCOUNTER — Ambulatory Visit (HOSPITAL_COMMUNITY): Payer: Medicaid Other | Admitting: Psychiatry

## 2022-02-04 ENCOUNTER — Encounter (HOSPITAL_COMMUNITY): Payer: Self-pay | Admitting: Psychiatry

## 2022-02-04 ENCOUNTER — Telehealth (HOSPITAL_BASED_OUTPATIENT_CLINIC_OR_DEPARTMENT_OTHER): Payer: Medicaid Other | Admitting: Psychiatry

## 2022-02-04 VITALS — Wt 219.0 lb

## 2022-02-04 DIAGNOSIS — F419 Anxiety disorder, unspecified: Secondary | ICD-10-CM | POA: Diagnosis not present

## 2022-02-04 DIAGNOSIS — F200 Paranoid schizophrenia: Secondary | ICD-10-CM

## 2022-02-04 MED ORDER — PALIPERIDONE ER 9 MG PO TB24: 9.0000 mg | ORAL_TABLET | ORAL | 2 refills | Status: DC

## 2022-02-04 NOTE — Progress Notes (Signed)
Virtual Visit via Telephone Note  I connected with Lorraine Bryant on 02/04/22 at  2:00 PM EDT by telephone and verified that I am speaking with the correct person using two identifiers.  Location: Patient: Home Provider: Home Office   I discussed the limitations, risks, security and privacy concerns of performing an evaluation and management service by telephone and the availability of in person appointments. I also discussed with the patient that there may be a patient responsible charge related to this service. The patient expressed understanding and agreed to proceed.   History of Present Illness: Patient is evaluated by phone session.  Her mother was also available and she picked up the phone who had concern that patient may not be taking the medication on time.  Mother reported sometimes she talk to herself and get upset when she question about the compliance.  I can hear patient's voice in the background who got upset on her mother and replied that she is taking the medicine every night.  She does not report having any hallucination, suicidal thoughts or homicidal thoughts.  She reported the only voice she heard is the god voice and she has been hearing them for a while.  She denies any suicidal thoughts.  She denies any panic attack.  She is taking Invega 9 mg.  I offered switching to injection but patient refused.  She admitted sometimes goes to bed very late because she is on the social media but denies any other major concerns.  She reported few pounds weight gain because not walking every day.  There has been no recent trip to grandparents which has been a lot of trigger for the patient.  Patient and her mother denies any agitation, aggression, violence.  Patient denies any tremors, shakes or any EPS.  She denies drinking or using any illegal substances.    Past Psychiatric History:  H/O inpatient at [place], [facility]. Tried trazodone, Vistaril, Cogentin, Prolixin  inj, Haldol, Abilify, olanzapine, Tegretol, [place] and [place].  Abilify caused restless, Latuda made nausea and olanzapine caused weight gain.   Psychiatric Specialty Exam: Physical Exam  Review of Systems  Weight 219 lb (99.3 kg).There is no height or weight on file to calculate BMI.  General Appearance: NA  Eye Contact:  NA  Speech:  Slow  Volume:  Normal  Mood:   upset with mother  Affect:  NA  Thought Process:  Descriptions of Associations: Intact  Orientation:  Full (Time, Place, and Person)  Thought Content:  Rumination and reported some times talk to GOD  Suicidal Thoughts:  No  Homicidal Thoughts:  No  Memory:  Immediate;   Fair Recent;   Fair Remote;   Fair  Judgement:  Fair  Insight:  Shallow  Psychomotor Activity:  NA  Concentration:  Concentration: Fair and Attention Span: Fair  Recall:  [employer] of Knowledge:  Fair  Language:  Good  Akathisia:  No  Handed:  Right  AIMS (if indicated):     Assets:  Desire for Improvement Housing  ADL's:  Intact  Cognition:  WNL  Sleep:   fair      Assessment and Plan: Schizophrenia chronic paranoid type.  Anxiety.  Reinforce patient about her compliance as mother is concerned that she is not taking the medication every night.  I also discussed switching to injection but patient refused.  She comfortable with the current dose of Invega 9 mg.  She is not interested in therapy.  She  has no side effects.  Discussed medication side effects and benefits.  Continue Invega 9 mg daily.  Patient sometimes takes melatonin as needed for sleep.  Recommended to call us back if she has any question or any concern.  Follow-up in 3 months.  Follow Up Instructions:    I discussed the assessment and treatment plan with the patient. The patient was provided an opportunity to ask questions and all were answered. The patient agreed with the plan and demonstrated an understanding of the instructions.   The patient was advised to call  back or seek an in-person evaluation if the symptoms worsen or if the condition fails to improve as anticipated.  I provided 20 minutes of non-face-to-face time during this encounter.   Cleotis Nipper, MD

## 2022-02-06 ENCOUNTER — Telehealth (HOSPITAL_COMMUNITY): Payer: Self-pay | Admitting: *Deleted

## 2022-02-06 NOTE — Telephone Encounter (Signed)
PA FOR INVEGA ER 9 MG TABLETS 1 QD #30 SUBMITTED TO [place] [employer].  PA APPROVED FROM 02/05/22 THROUGH 01/31/23,   PA CASE # [id number].

## 2022-02-11 ENCOUNTER — Other Ambulatory Visit (HOSPITAL_COMMUNITY): Payer: Self-pay | Admitting: Psychiatry

## 2022-02-11 DIAGNOSIS — F419 Anxiety disorder, unspecified: Secondary | ICD-10-CM

## 2022-03-29 ENCOUNTER — Telehealth (HOSPITAL_COMMUNITY): Payer: Self-pay | Admitting: *Deleted

## 2022-03-29 NOTE — Telephone Encounter (Signed)
Pt's mother, with whom she lives, presented to this office with concerns regarding pt's behaviors. Mother describes lability in mood, locking herself in public restroom for an hour. Pt also has been trying to stay with a female friend who lives with his ailing father and this is not feasible mother says. Pt appears to be responding to internal stimuli she says. Mother asking to speak to Dr. Lolly Mustache. This is not the first time mother presents with similar c/o regarding pt. Mother is well known to this office. When provider or this nurse speaks to pt she answers questions appropriately and does not want to change or titrate any medications. Per Dr. Lolly Mustache pt [facility] be [facility] to [facility]. No med changes to be made as this is against pt wishes.  Mother advised of this and states pt has started therapy and doesn't think pt needs it right now as mother thinks pt need medication change. Mother advised that referral will be made as pt needs a higher level of care and that Dr. Lolly Mustache will not be making medication changes and will discuss with pt on her upcoming appointment on 04/09/22. Mother verbalizes understanding. Will call office as needed.

## 2022-04-01 NOTE — Telephone Encounter (Signed)
Hopefully she can do PHP and upon completion refer to see a therapist.   Thanks.

## 2022-04-09 ENCOUNTER — Emergency Department (HOSPITAL_COMMUNITY)
Admission: EM | Admit: 2022-04-09 | Discharge: 2022-04-11 | Disposition: A | Discharge status: Home / Self Care | Payer: Medicaid Other | Attending: Emergency Medicine | Admitting: Emergency Medicine

## 2022-04-09 ENCOUNTER — Telehealth (HOSPITAL_BASED_OUTPATIENT_CLINIC_OR_DEPARTMENT_OTHER): Payer: Medicaid Other | Admitting: Psychiatry

## 2022-04-09 DIAGNOSIS — F200 Paranoid schizophrenia: Secondary | ICD-10-CM | POA: Diagnosis not present

## 2022-04-09 DIAGNOSIS — Z79899 Other long term (current) drug therapy: Secondary | ICD-10-CM | POA: Diagnosis not present

## 2022-04-09 DIAGNOSIS — Z20822 Contact with and (suspected) exposure to covid-19: Secondary | ICD-10-CM | POA: Diagnosis not present

## 2022-04-09 DIAGNOSIS — F419 Anxiety disorder, unspecified: Secondary | ICD-10-CM | POA: Diagnosis not present

## 2022-04-09 DIAGNOSIS — Z046 Encounter for general psychiatric examination, requested by authority: Secondary | ICD-10-CM | POA: Insufficient documentation

## 2022-04-09 DIAGNOSIS — F209 Schizophrenia, unspecified: Secondary | ICD-10-CM | POA: Insufficient documentation

## 2022-04-09 MED ORDER — PALIPERIDONE ER 9 MG PO TB24: 9.0000 mg | ORAL_TABLET | ORAL | 0 refills | Status: DC

## 2022-04-09 MED ORDER — INVEGA SUSTENNA 156 MG/ML IM SUSY: 156.0000 mg | PREFILLED_SYRINGE | Freq: Once | INTRAMUSCULAR | 0 refills | Status: DC
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
## 2022-04-09 NOTE — ED Notes (Signed)
IVC paperwork in [facility].  
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
## 2022-04-09 NOTE — ED Triage Notes (Signed)
Pt via [facility] as IVC by mom; mom states pt's schizophrenia uncontrolled. Pt denies SI/HI, calm & cooperative in triage. Pt states she feels mom has "too much control over me."

## 2022-04-10 ENCOUNTER — Ambulatory Visit (HOSPITAL_COMMUNITY): Payer: Medicaid Other

## 2022-04-10 LAB — COMPREHENSIVE METABOLIC PANEL
ALT: 14 U/L (ref 0–44)
AST: 17 U/L (ref 15–41)
Albumin: 3.9 g/dL (ref 3.5–5.0)
Alkaline Phosphatase: 54 U/L (ref 38–126)
Anion gap: 12 (ref 5–15)
BUN: <5 mg/dL — ABNORMAL LOW (ref 6–20)
CO2: 23 mmol/L (ref 22–32)
Calcium: 9.8 mg/dL (ref 8.9–10.3)
Chloride: 105 mmol/L (ref 98–111)
Creatinine, Ser: 0.68 mg/dL (ref 0.44–1.00)
GFR, Estimated: >60 mL/min (ref >60)
Glucose, Bld: 106 mg/dL — ABNORMAL HIGH (ref 70–99)
Potassium: 3.6 mmol/L (ref 3.5–5.1)
Sodium: 140 mmol/L (ref 135–145)
Total Bilirubin: 0.3 mg/dL (ref 0.3–1.2)
Total Protein: 7.3 g/dL (ref 6.5–8.1)

## 2022-04-10 LAB — CBC WITH DIFFERENTIAL/PLATELET
Abs Immature Granulocytes: 0.02 10*3/uL (ref 0.00–0.07)
Basophils Absolute: 0 10*3/uL (ref 0.0–0.1)
Basophils Relative: 1 %
Eosinophils Absolute: 0.1 10*3/uL (ref 0.0–0.5)
Eosinophils Relative: 1 %
HCT: 39.6 % (ref 36.0–46.0)
Hemoglobin: 12.3 g/dL (ref 12.0–15.0)
Immature Granulocytes: 0 %
Lymphocytes Relative: 31 %
Lymphs Abs: 2.5 10*3/uL (ref 0.7–4.0)
MCH: 24 pg — ABNORMAL LOW (ref 26.0–34.0)
MCHC: 31.1 g/dL (ref 30.0–36.0)
MCV: 77.3 fL — ABNORMAL LOW (ref 80.0–100.0)
Monocytes Absolute: 0.7 10*3/uL (ref 0.1–1.0)
Monocytes Relative: 8 %
Neutro Abs: 4.7 10*3/uL (ref 1.7–7.7)
Neutrophils Relative %: 59 %
Platelets: 336 10*3/uL (ref 150–400)
RBC: 5.12 MIL/uL — ABNORMAL HIGH (ref 3.87–5.11)
RDW: 14.5 % (ref 11.5–15.5)
WBC: 7.9 10*3/uL (ref 4.0–10.5)
nRBC: 0 % (ref 0.0–0.2)

## 2022-04-10 LAB — RAPID URINE DRUG SCREEN, HOSP PERFORMED
Amphetamines: NOT DETECTED
Barbiturates: NOT DETECTED
Benzodiazepines: NOT DETECTED
Cocaine: NOT DETECTED
Opiates: NOT DETECTED
Tetrahydrocannabinol: NOT DETECTED

## 2022-04-10 LAB — I-STAT BETA HCG BLOOD, ED (MC, WL, AP ONLY): I-stat hCG, quantitative: <5 m[IU]/mL (ref <5)

## 2022-04-10 LAB — ETHANOL: Alcohol, Ethyl (B): <10 mg/dL (ref <10)

## 2022-04-10 LAB — ACETAMINOPHEN LEVEL: Acetaminophen (Tylenol), Serum: <10 ug/mL — ABNORMAL LOW (ref 10–30)

## 2022-04-10 LAB — RESP PANEL BY RT-PCR (FLU A&B, COVID) ARPGX2
Influenza A by PCR: NEGATIVE
Influenza B by PCR: NEGATIVE
SARS Coronavirus 2 by RT PCR: NEGATIVE

## 2022-04-10 LAB — SALICYLATE LEVEL: Salicylate Lvl: <7 mg/dL — ABNORMAL LOW (ref 7.0–30.0)

## 2022-04-10 MED ORDER — ONDANSETRON HCL 4 MG PO TABS: 4.0000 mg | ORAL_TABLET | Freq: Three times a day (TID) | ORAL | Status: DC | PRN

## 2022-04-10 MED ORDER — ACETAMINOPHEN 325 MG PO TABS: 650.0000 mg | ORAL_TABLET | ORAL | Status: DC | PRN

## 2022-04-10 MED ORDER — ZOLPIDEM TARTRATE 5 MG PO TABS: 5.0000 mg | ORAL_TABLET | Freq: Every evening | ORAL | Status: DC | PRN

## 2022-04-10 NOTE — ED Notes (Signed)
Introduced myself to pt. Pt lying in bed, calm and cooperative. Pt denies any needs at this time.

## 2022-04-10 NOTE — ED Notes (Signed)
ED Provider at bedside. 

## 2022-04-10 NOTE — ED Notes (Signed)
TTS in process 

## 2022-04-10 NOTE — ED Notes (Signed)
Breakfast order placed ?

## 2022-04-10 NOTE — ED Notes (Signed)
Pt's mother at bedside.

## 2022-04-10 NOTE — ED Notes (Signed)
PT belongings in locker 12. PT wanded by security

## 2022-04-10 NOTE — ED Notes (Signed)
This RN introduced self to pt and showed pt to her room. Pt requested to call mom. Pt called, no answer, left voicemail. Pt to try again in the morning

## 2022-04-10 NOTE — ED Notes (Signed)
Review of IVC Docs in [facility] chart reflects expiration of 04/16/22.

## 2022-04-10 NOTE — ED Notes (Signed)
Belongings in locker #12

## 2022-04-10 NOTE — ED Provider Notes (Addendum)
Emergency Medicine Observation Re-evaluation Note  Lorraine Bryant is a 36 y.o. female, seen on rounds today.  Pt initially presented to the ED for complaints of schizophrenia.  This AM pt calm and conversant. She reports feeling as if inpatient psych stay not helpful/beneficial - she indicates prefers to f/u with her psychiatrist. Discussed possibly transitioning back to IM meds.   Physical Exam  BP (!) 136/93 (BP Location: Left Arm)   Pulse 63   Temp 98.4 F (36.9 C) (Oral)   Resp 18   SpO2 98%  Physical Exam General: content, conversant, calm, pleasant.  Cardiac: regular rate. Lungs: breathing comfortably Psych: normal mood and affect. Denies thoughts of harm to self or others. Expresses regret for recent argument w mother. Thought processes appear clear/logical. Acknowledges intermittent feelings of anxiety/stress. Pt does not currently appear to be responding to internal stimuli - no hallucinations or worrisome acute psychosis noted.   ED Course / MDM    I have reviewed the labs performed to date as well as medications administered while in observation.  Recent changes in the last 24 hours include ED obs, reassessment.   Plan  BH re-evaluation pending - will follow up on later this AM.   Overall patient appears stable for outpatient BH follow up, outpatient med management.     Cathren Laine, MD 04/10/22 [phone]   Discussed with mom - she indicates she feels pts BH needs meds need to be adjusted - she feels that invega, whether po or IM, has not been particularly effective.   BH team to evaluate, including possible BH medication adjustment.       Cathren Laine, MD 04/10/22 [phone]

## 2022-04-11 NOTE — ED Notes (Signed)
Patient continues to come out to desk states she does not fee comfortable in room; pt continues to stand in hallway even when asked to return to room while staff deals with another patient; Pt butts in conversations that staff are having and trys to put her input. Patient requested to call her mother at 1:30am; RN advised patient she will be allowed to make phone calls after shift change and to allow her mother to get rest at this time of the morning; Patient sometimes stare off when speaking to her and does not respond to staff request until asked multiple times-Monique,RN

## 2022-04-11 NOTE — ED Notes (Addendum)
Sherriff's office here to transport patient to [facility]. EMTALA and EDP assessment and med nec completed. VS stable upon D/C. Pt's belongings given to Psychologist, clinical. Pt ambulated out of facility in stable condition with transporting officers. This nurse called pt's mother to inform of transport. Pt's mother appreciative of call

## 2022-04-11 NOTE — Progress Notes (Signed)
Inpatient Behavioral Health Placement  Pt meets inpatient criteria per Ophelia Shoulder, NP. There are no available beds at [facility]. Referral was sent to the following facilities;   Destination Service Provider Address Phone Fax  [facility]  [address] [place] [address] [phone]  [facility]  [address] [place], [place] [address] [phone]  [facility]  [address] [place] [address] [phone]  [facility]  [address] [place], [place] [address] [phone]  [facility]  [address] [place] [address] [phone]  [facility]  [address] [place] [address] [phone]  [facility]  [address], [place] [address] [phone]  [facility]  [address], [place] [address] [phone]  [facility]  [address] [place] [address] [phone]  [facility]  [address] [place] [address] [phone]  [facility]  [address] [place] [address] [phone]  [facility]  [address] [place] [address] [phone]  [facility]  [address], [place] [address] [phone]  [facility]  [address], [place] [address] [phone]  [facility]  [address], [place] [address] [phone]  [facility]  [address] [place] [address] [phone]   Situation ongoing,  CSW will follow up.   Maryjean Ka, MSW, [facility] 04/11/2022   @ 3:04 PM

## 2022-04-11 NOTE — ED Notes (Signed)
Pt currently  visiting in room with mother

## 2022-04-11 NOTE — ED Notes (Signed)
Report called to Karin Golden, nurse at [facility] unit [facility] Secretary calling sherriffs office for transport to facility

## 2022-04-11 NOTE — BH Assessment (Addendum)
Patient's nurse inquired about contacting patient's mother to provide updates. Clinician review patient's chart and a TTS note from staff, "Collateral contact, Lorraine Bryant, mother, [phone], not available at this time. TTS will attempt at later time.".   Patient's nurse requested TTS staff to assist in contacting patient's mother to provider disposition updates. Clinician contacted the phone number provided above to obtain collateral information, which was attempted @ 0409. Clinician  attempted 2x's and female picked up the phone indicating, "This is the wrong number" with each attempt made. Patient's nurse provided updates about attempts.   Per chart review, patient's mother, Lorraine Bryant has a different listed contact [phone]. Clinician made another attempt to contact patient's mother using this number instead to request collateral information.  No answer. Left a HIPPA compliant voicemail.

## 2022-04-11 NOTE — ED Notes (Signed)
Pt's mother asked this nurse if she had eaten lunch, informed mother that pt has been throwing meal trays away. Pt states "I am fasting."

## 2022-04-11 NOTE — ED Notes (Signed)
Lorraine Bryant from [facility] called this nurse to ask about potentialv placement at their facility. Questions answered and Lorraine Bryant stated she will get back in touch with [facility] for possible placement today

## 2022-04-11 NOTE — ED Notes (Signed)
Pt on phone with mother, informed it will be one of her 2 5-minute phone calls for the day. Pt informed of visitation times to tell mother. Pt ambulated back to room after ending call

## 2022-04-11 NOTE — ED Notes (Signed)
Pt got sanitizer from dispenser and stood up, wiped sanitizer on neck. Pt then raised hands with palms upward toward ceiling and looked up at ceiling/sky before sitting back down. Pt randomly giggling. Pt did finally drink a lemon lime soda after much persuasion to hydrate

## 2022-04-11 NOTE — ED Provider Notes (Signed)
Emergency Medicine Observation Re-evaluation Note  Lorraine Bryant is a 36 y.o. female, seen on rounds today.  Pt initially presented to the ED for complaints of IVC Currently, the patient is alert and calm.  She is awaiting placement, but she told me she has been cleared for d/c which is not the case.  Physical Exam  BP 132/81 (BP Location: Right Arm)   Pulse 79   Temp 98.4 F (36.9 C) (Oral)   Resp 19   SpO2 99%  Physical Exam General: awake and alert Cardiac: rrr Lungs: cta b Psych: calm  ED Course / MDM  EKG:   I have reviewed the labs performed to date as well as medications administered while in observation.  Recent changes in the last 24 hours include inpatient psych placement recommended.  Plan  Current plan is for inpatient psych placement. Lorraine Bryant is under involuntary commitment.      Jacalyn Lefevre, MD 04/11/22 [phone]

## 2022-04-11 NOTE — ED Notes (Signed)
Pt allowed to call mother to notify of transport to new facility, mother asked to speak with this nurse. Mother upset that patient will not be in [place], states Lorraine Bryant is too far and requests contact information for who to call to change that. Explained to pt's mother that because pt is under IVC and [facility] accepted, that will be the facility that she will be going to at this time. Multiple questions asked by pt's mother. Pt's mother accepts decision and pt allowed to say goodbye to mother.

## 2022-04-11 NOTE — BH Assessment (Signed)
Comprehensive Clinical Assessment (CCA) Note  04/11/2022 Lorraine Bryant [id number]  Disposition:  Per [facility] TTS, Dr. Lucianne Muss recommends inpatient treatment. Disposition SW to secure placement. Monique, RN, informed of disposition.   The patient demonstrates the following risk factors for suicide: Chronic risk factors for suicide include: psychiatric disorder of schizophrenia . Acute risk factors for suicide include: family or marital conflict. Protective factors for this patient include: positive therapeutic relationship, responsibility to others (children, family), coping skills, and hope for the future. Considering these factors, the overall suicide risk at this point appears to be moderate. Patient is not appropriate for outpatient follow up.  Flowsheet Row ED from 04/09/2022 in [facility] EMERGENCY DEPARTMENT Counselor from 09/26/2021 in [facility] from 08/11/2021 in [facility]  C-SSRS RISK CATEGORY No Risk No Risk No Risk      Lorraine Bryant is a 36 year old female presenting under IVC to [facility] due to psych evaluation. Per IVC, patients mother states patients schizophrenia is uncontrolled. Patient denied SI, HI, psychosis and alcohol/drug usage. When asked, why are you here, patient reported "an increase in anxiety". Patient continues to report throughout assessment the only stressor is "being on the right path on this journey called life". Patient reports mother has too much control over her. Patient gave no specifics. Patient denied depressive symptoms. Patient was not forthcoming with information. Patient denied prior psych hospitalizations, suicide attempts and self-harming behaviors. Patient reported 7 hours nightly sleep and normal appetite. Patient was not forthcoming with information, only stating "my anxiety was up".   Patient currently sees Dr. Lolly Mustache for medication management, last seen on  04/09/22.   Patient currently resides with mother. Patient denied family discord. Patient is currently unemployed. Patient reported shared having a B.S. 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 Patient denied access to guns. Patient was cooperative during assessment.   Collateral contact,  Lorraine Bryant, mother, [phone], not available at this time. TTS will attempt at later time.   Chief Complaint:  Chief Complaint  Patient presents with   IVC   Visit Diagnosis:  Hx of schizophrnenia   CCA Screening, Triage and Referral (STR)  Patient Reported Information How did you hear about [place]? Legal System  What Is the Reason for Your Visit/Call Today? IVC  How Long Has This Been Causing You Problems? -- Rich Reining)  What Do You Feel Would Help You the Most Today? Treatment for Depression or other mood problem   Have You Recently Had Any Thoughts About Hurting Yourself? No  Are You Planning to Commit Suicide/Harm Yourself At This time? No   Have you Recently Had Thoughts About Hurting Someone Lorraine Bryant? No  Are You Planning to Harm Someone at This Time? No  Explanation: No data recorded  Have You Used Any Alcohol or Drugs in the Past 24 Hours? No  How Long Ago Did You Use Drugs or Alcohol? No data recorded What Did You Use and How Much? No data recorded  Do You Currently Have a Therapist/Psychiatrist? Yes  Name of Therapist/Psychiatrist: Dr. Lolly Mustache   Have You Been Recently Discharged From Any Office Practice or Programs? No  Explanation of Discharge From Practice/Program: No data recorded    CCA Screening Triage Referral Assessment Type of Contact: Tele-Assessment  Telemedicine Service Delivery:   Is this Initial or Reassessment? Initial Assessment  Date Telepsych consult ordered in [facility]:  04/09/22  Time Telepsych consult ordered in [facility]:  2308  Location of Assessment: [facility] ED  Provider Location: [facility] Assessment Services   Collateral  Involvement: Call placed to mother with pt's verbal permission. Mother was the petitioner for the IVC.   Does Patient Have a Automotive engineer Guardian? No data recorded Name and Contact of Legal Guardian: No data recorded If Minor and Not Living with Parent(s), Who has Custody? No data recorded Is CPS involved or ever been involved? -- (uta)  Is APS involved or ever been involved? -- Rich Reining)   Patient Determined To Be At Risk for Harm To Self or Others Based on Review of Patient Reported Information or Presenting Complaint? No (Per Shuvon Rankin NP, Pt is psych cleared. Recommend contacting her psychiatrist's office for assistance and instruction.)  Method: No data recorded Availability of Means: No data recorded Intent: No data recorded Notification Required: No data recorded Additional Information for Danger to Others Potential: No data recorded Additional Comments for Danger to Others Potential: No data recorded Are There Guns or Other Weapons in Your Home? No data recorded Types of Guns/Weapons: No data recorded Are These Weapons Safely Secured?                            No data recorded Who Could Verify You Are Able To Have These Secured: No data recorded Do You Have any Outstanding Charges, Pending Court Dates, Parole/Probation? No data recorded Contacted To Inform of Risk of Harm To Self or Others: No data recorded   Does Patient Present under Involuntary Commitment? Yes  IVC Papers Initial File Date: 08/07/21   [place] of Residence: [place]   Patient Currently Receiving the Following Services: Medication Management   Determination of Need: Urgent (48 hours)   Options For Referral: Inpatient Hospitalization; Outpatient Therapy; Medication Management     CCA Biopsychosocial Patient Reported Schizophrenia/Schizoaffective Diagnosis in Past: Yes   Strengths: self-awareness   Mental Health Symptoms Depression:   None   Duration of Depressive symptoms:     Mania:   None   Anxiety:    Irritability; Restlessness   Psychosis:   None   Duration of Psychotic symptoms:    Trauma:   None   Obsessions:   None   Compulsions:   None   Inattention:   None   Hyperactivity/Impulsivity:   None   Oppositional/Defiant Behaviors:   None   Emotional Irregularity:   Mood lability   Other Mood/Personality Symptoms:   uta    Mental Status Exam Appearance and self-care  Stature:   Tall   Weight:   Overweight   Clothing:   Casual   Grooming:   Normal   Cosmetic use:   Age appropriate   Posture/gait:   Normal   Motor activity:   Not Remarkable   Sensorium  Attention:   Normal   Concentration:   Normal   Orientation:   Person; Place; Situation; Time   Recall/memory:   Normal   Affect and Mood  Affect:   Full Range; Appropriate   Mood:   Euthymic   Relating  Eye contact:   Normal   Facial expression:   Responsive   Attitude toward examiner:   Cooperative   Thought and Language  Speech flow:  Clear and Coherent   Thought content:   Appropriate to Mood and Circumstances   Preoccupation:   None   Hallucinations:   None (Denied)   Organization:  No data recorded  [employer]  Fund of Knowledge:   Fair   Intelligence:   Average   Abstraction:   Functional   Judgement:   Fair   Dance movement psychotherapist:   Adequate   Insight:   Fair   Decision Making:   Confused   Social Functioning  Social Maturity:   Impulsive   Social Judgement:   Normal   Stress  Stressors:   Family conflict; Relationship   Coping Ability:   Overwhelmed; Exhausted   Skill Deficits:   Interpersonal; Decision making   Supports:   Family; Friends/Service system; Support needed     Religion: Religion/Spirituality Are You A Religious Person?: Yes  Leisure/Recreation: Leisure / Recreation Do You Have Hobbies?: Yes Leisure and Hobbies: shopping, reading and eating at  restaurants  Exercise/Diet: Exercise/Diet Do You Exercise?: No Have You Gained or Lost A Significant Amount of Weight in the Past Six Months?: No Do You Follow a Special Diet?: No Do You Have Any Trouble Sleeping?: No   CCA Employment/Education Employment/Work Situation: Employment / Work Situation Employment Situation: Unemployed (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) Has Patient ever Been in Equities trader?: No  Education: Education Is Patient Currently Attending School?: No Last Grade Completed: 16 Did You Attend College?: Yes What Type of College Degree Do you Have?: [employer], B.S. Social Work Did [place] Have An Engineer, manufacturing (IIEP): No Did You Have Any Difficulty At [employer]?: No   CCA Family/Childhood History Family and Relationship History: Family history Marital status: Single Does patient have children?: No  Childhood History:  Childhood History By whom was/is the patient raised?: Mother Did patient suffer any verbal/emotional/physical/sexual abuse as a child?: No Has patient ever been sexually abused/assaulted/raped as an adolescent or adult?: No Witnessed domestic violence?: No Has patient been affected by domestic violence as an adult?: No  Child/Adolescent Assessment:     CCA Substance Use Alcohol/Drug Use: Alcohol / Drug Use Pain Medications: see MAR Prescriptions: see MAR Over the Counter: see MAR History of alcohol / drug use?: No history of alcohol / drug abuse                         ASAM's:  Six Dimensions of Multidimensional Assessment  Dimension 1:  Acute Intoxication and/or Withdrawal Potential:      Dimension 2:  Biomedical Conditions and Complications:      Dimension 3:  Emotional, Behavioral, or Cognitive Conditions and Complications:     Dimension 4:  Readiness to Change:     Dimension 5:  Relapse, Continued use, or  Continued Problem Potential:     Dimension 6:  Recovery/Living Environment:     ASAM Severity Score:    ASAM Recommended Level of Treatment:     Substance use Disorder (SUD)    Recommendations for Services/Supports/Treatments: Recommendations for Services/Supports/Treatments Recommendations For Services/Supports/Treatments: Individual Therapy, Medication Management  Discharge Disposition:    DSM5 Diagnoses: Patient Active Problem List   Diagnosis Date Noted   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

## 2022-04-11 NOTE — ED Notes (Signed)
Pt has been sitting in chair in doorway of room since 0800. Pt cooperative to go back to room when wanders out of room. Pt does occasionally pace about 5 feet out of room and returns back to chair, will sometimes dance or snap fingers or say "hallelujiah" with hands in the air before sitting back down. Pt has answered numerous times to go home, asks staff if they're the doctor, and asks often about mother.

## 2022-04-11 NOTE — ED Notes (Signed)
Pt states mother's number as [phone]. Relayed this info to counselor at [facility] as pt and pt's mother requesting a call from counselor as noted in previous chart note but pt's mother was likely on unit visiting pt at time of phone call and did not have phone on her.

## 2022-04-11 NOTE — ED Notes (Addendum)
Per sitter, pt has not eaten or had anything to drink today. Pt also did not eat yesterday. Pt did throw away breakfast tray this AM. Pt declined lunch tray and tray was left at nurse's station. Pt came quickly out to nurse's station, grabbed untouched unopened lunch tray and threw it into trash. Pt states "I do not eat meat" but says that she likes pizza but does not want anything right now. When asked if she would eat something that is not meat for dinner, pt states "If I like it." Pt currently dancing to self in room.

## 2022-04-11 NOTE — Progress Notes (Signed)
Pt was accepted to [facility] today 04/11/22; Bed Assignment Deatra Canter A  Pt meets inpatient criteria per Ophelia Shoulder, NP  Attending Physician will be Dr. Theophilus Bones  Report can be called to: [phone]  Pt can arrive: Now  Care Team notified: Lamount Cranker, RN  Kelton Pillar, LCSWA 04/11/2022 @ 4:20 PM

## 2022-04-11 NOTE — ED Notes (Signed)
Pt on phone with mother, informed that this is her second and last phone call of the day. Pt asking mother to pick her up, this nurse informed pt she is still under TTS and not cleared to go home at this time

## 2022-04-18 ENCOUNTER — Telehealth (HOSPITAL_COMMUNITY): Payer: Medicaid Other | Admitting: Psychiatry

## 2022-05-03 ENCOUNTER — Telehealth (HOSPITAL_COMMUNITY): Payer: Medicaid Other | Admitting: Psychiatry

## 2022-05-27 ENCOUNTER — Other Ambulatory Visit: Payer: Self-pay

## 2022-05-27 ENCOUNTER — Emergency Department (HOSPITAL_BASED_OUTPATIENT_CLINIC_OR_DEPARTMENT_OTHER): Payer: Medicaid Other

## 2022-05-27 ENCOUNTER — Encounter (HOSPITAL_BASED_OUTPATIENT_CLINIC_OR_DEPARTMENT_OTHER): Payer: Self-pay

## 2022-05-27 ENCOUNTER — Emergency Department (HOSPITAL_BASED_OUTPATIENT_CLINIC_OR_DEPARTMENT_OTHER)
Admission: EM | Admit: 2022-05-27 | Discharge: 2022-05-27 | Disposition: A | Discharge status: Home / Self Care | Payer: Medicaid Other | Attending: Emergency Medicine | Admitting: Emergency Medicine

## 2022-05-27 DIAGNOSIS — R102 Pelvic and perineal pain: Secondary | ICD-10-CM | POA: Insufficient documentation

## 2022-05-27 DIAGNOSIS — Z3202 Encounter for pregnancy test, result negative: Secondary | ICD-10-CM | POA: Diagnosis not present

## 2022-05-27 DIAGNOSIS — R1030 Lower abdominal pain, unspecified: Secondary | ICD-10-CM | POA: Diagnosis present

## 2022-05-27 NOTE — ED Notes (Signed)
Pt informed of the need for a urine. Pt unable to provide urine currently.

## 2022-05-27 NOTE — ED Notes (Signed)
Pt's mother Lenna Sciara can be reached at [phone]. Will be waiting on update at her car. Pt has requested no family be brought back. Mother states she is her POA. No paperwork presented with registration.

## 2022-05-27 NOTE — Discharge Instructions (Signed)
Your ultrasound examination does not reveal pregnancy. I did not note any other cause to explain your lower abdominal discomfort.  I do not recommend that you return to the emergency department for a nonemergent evaluation of pregnancy, if you do not trust our ultrasound examination and techs you can follow-up outpatient with an OB/GYN or your primary care provider for further evaluation.

## 2022-05-27 NOTE — ED Provider Notes (Signed)
[facility] EMERGENCY DEPT Provider Note   CSN: [id number] Arrival date & time: 05/27/22  1933     History  Chief Complaint  Patient presents with   Possible Pregnancy    Lorraine Bryant is a 36 y.o. female with a past medical history significant for paranoid schizophrenia, galactorrhea with hyperprolactinemia, psychosis, hallucinations who presents with concern for possible pregnancy, and "excruciating lower abdominal pain".  Patient was seen and evaluated at [facility], reports that she had an ultrasound as well as hCG test, I do not see evidence of an ultrasound, but i hCG was negative 6 days ago.  Patient denies any vaginal bleeding, vaginal discharge.  Patient reports that she "heard a vision from God telling her that she was not going to have a normal pregnancy".   Possible Pregnancy       Home Medications Prior to Admission medications   Medication Sig Start Date End Date Taking? Authorizing Provider  Calcium Carbonate Antacid (TUMS PO) Take 1-2 tablets by mouth as needed (reflux).    [provider]  Cholecalciferol 1.25 MG (50000 UT) capsule cholecalciferol (vitamin D3) 1,250 mcg (50,000 unit) capsule  Take 1 capsule orally once a week for 8 weeks Patient not taking: Reported on 04/10/2022    Norm Salt, PA  gabapentin (NEURONTIN) 100 MG capsule Take 1 capsule (100 mg total) by mouth 2 (two) times daily. Patient not taking: Reported on 10/11/2021 08/30/21 08/30/22  Cleotis Nipper, MD  hydrOXYzine (VISTARIL) 25 MG capsule Take 1 capsule (25 mg total) by mouth at bedtime as needed. Patient taking differently: Take 25 mg by mouth at bedtime as needed (sleep). 07/04/21   Arfeen, Phillips Grout, MD  LORazepam (ATIVAN) 0.5 MG tablet Take 0.5 tablets (0.25 mg total) by mouth 2 (two) times daily as needed for anxiety. Patient not taking: Reported on 08/30/2021 08/13/21 08/13/22  Jackelyn Poling, NP  Melatonin 10 MG SUBL Place 10 mg under the tongue at bedtime as  needed (sleep).    [provider]  paliperidone (INVEGA SUSTENNA) 156 MG/ML SUSY injection Inject 1 mL (156 mg total) into the muscle once for 1 dose. Patient not taking: Reported on 04/10/2022 04/09/22 04/09/22  Arfeen, Phillips Grout, MD  paliperidone (INVEGA) 9 MG 24 hr tablet Take 1 tablet (9 mg total) by mouth every morning. Patient taking differently: Take 9 mg by mouth at bedtime. 04/09/22   Arfeen, Phillips Grout, MD      Allergies    Abilify [aripiprazole]    Review of Systems   Review of Systems  All other systems reviewed and are negative.   Physical Exam Updated Vital Signs BP 122/80 (BP Location: Right Arm)   Pulse 80   Temp 97.6 F (36.4 C) (Temporal)   Resp 16   Ht 5\' 3"  (1.6 m)   Wt 89.8 kg   LMP  (LMP Unknown)   SpO2 98%   BMI 35.07 kg/m  Physical Exam Vitals and nursing note reviewed.  Constitutional:      General: She is not in acute distress.    Appearance: Normal appearance.  HENT:     Head: Normocephalic and atraumatic.  Eyes:     General:        Right eye: No discharge.        Left eye: No discharge.  Cardiovascular:     Rate and Rhythm: Normal rate and regular rhythm.     Heart sounds: No murmur heard.    No friction rub. No gallop.  Pulmonary:     Effort: Pulmonary effort is normal.     Breath sounds: Normal breath sounds.  Abdominal:     General: Bowel sounds are normal.     Palpations: Abdomen is soft.     Comments: Patient endorses tenderness to palpation suprapubically, however she is not having any reaction when I press on her abdomen.  No rebound, rigidity, guarding throughout.  I do not note any masses or fullness in the lower abdomen.  Skin:    General: Skin is warm and dry.     Capillary Refill: Capillary refill takes less than 2 seconds.  Neurological:     Mental Status: She is alert and oriented to person, place, and time.  Psychiatric:        Mood and Affect: Mood normal.        Behavior: Behavior normal.     ED Results /  Procedures / Treatments   Labs (all labs ordered are listed, but only abnormal results are displayed) Labs Reviewed  URINALYSIS, ROUTINE W REFLEX MICROSCOPIC  PREGNANCY, URINE  CBC  COMPREHENSIVE METABOLIC PANEL  LIPASE, BLOOD  RAPID URINE DRUG SCREEN, HOSP PERFORMED    EKG None  Radiology US PELVIC COMPLETE WITH TRANSVAGINAL  Result Date: 05/27/2022 CLINICAL DATA:  Pelvic pain EXAM: TRANSABDOMINAL AND TRANSVAGINAL ULTRASOUND OF PELVIS TECHNIQUE: Both transabdominal and transvaginal ultrasound examinations of the pelvis were performed. Transabdominal technique was performed for global imaging of the pelvis including uterus, ovaries, adnexal regions, and pelvic cul-de-sac. It was necessary to proceed with endovaginal exam following the transabdominal exam to visualize the ovaries and endometrium. COMPARISON:  11/29/2019 FINDINGS: Uterus Measurements: 9.4 x 5 x 5.4 cm = volume: 134 mL. No fibroids or other mass visualized. Endometrium Thickness: 1.4 cm.  No focal abnormality visualized. Right ovary Measurements: 3.8 x 2.1 x 2.3 cm = volume: 10 mL. Normal appearance/no adnexal mass. Left ovary Measurements: 4 x 2.2 x 2 cm = volume: 9 mL. Normal appearance/no adnexal mass. Other findings No abnormal free fluid. IMPRESSION: Normal ultrasound appearance of the uterus and ovaries. Electronically Signed   By: Burman Nieves M.D.   On: 05/27/2022 22:43    Procedures Procedures    Medications Ordered in ED Medications - No data to display  ED Course/ Medical Decision Making/ A&P                           Medical Decision Making Amount and/or Complexity of Data Reviewed Labs: ordered. Radiology: ordered.   This is a well-appearing 36 year old female with nongravid uterus who presents with concern for pregnancy, and reports abdominal cramping only after a asked about her previous evaluation for pregnancy already.  Prior to this patient reports some vague 3/10 discomfort to our triage  nursing team.  Discussed that as patient is endorsing severe pelvic pain will be reasonable to perform some lab work, urine pregnancy test, and an ultrasound.  Patient did not agree to lab work, but was taken to ultrasound without a pregnancy test.  Ultrasound shows no intrauterine pregnancy or other pelvic masses to explain her discomfort.  Patient does not believe ultrasound results on discussion with this provider.  I independently interpreted results and agree with the radiologist interpretation.  Patient continues to show no signs of distress or abdominal discomfort she is ambulatory in the room, and refusing lab work and evaluation.  Discussed that return to the emergency department for ultrasound evaluation of pregnancy with refusal of blood  work and urinalysis is an appropriate use of emergency department resources and I recommend that she follow-up with PCP and OB/GYN if she has additional concerns for pregnancy.  Patient discharged in stable condition at this time. Final Clinical Impression(s) / ED Diagnoses Final diagnoses:  Pregnancy examination or test, negative result    Rx / DC Orders ED Discharge Orders     None         Dorien Chihuahua 05/27/22 2316    Blanchie Dessert, MD 05/30/22 1728

## 2022-05-27 NOTE — ED Notes (Signed)
Pt was cleared for discharge by [facility]. Pt left room without discharge paperwork. Was followed by this RN and primary RN Legrand Como. Pt is going home with Mother Lenna Sciara who is with pt in lobby. Pt does not want to wait for discharge paperwork.

## 2022-05-27 NOTE — ED Notes (Addendum)
Blood collection was attempted. During attempt, blood was being retrieved and before the blood was able to be retrieved, Pt requested the butterfly be pulled out of her hand. No blood was able to be retrieved. Pt was informed that a second attempted would be needed if this attempted was unsuccessful. Pt was aware, still requested it to be pulled out. Pt was asked if another attempted could be made and the response was "No". The offer of getting another RN to retrieve the blood, still the response was "No". Charge RN was notified.

## 2022-05-27 NOTE — ED Triage Notes (Signed)
POV, pt sts that she had ultrasound 10/3 with [facility] and had positive ultrasound to confirm pregnancy. Looking through records pt ultrasound did not show pregnancy. Pt here to confirm pregnancy but declines to provide urine to this triage nurse. Pt alert and amb to triage.

## 2022-05-29 ENCOUNTER — Ambulatory Visit (HOSPITAL_COMMUNITY)
Admission: EM | Admit: 2022-05-29 | Discharge: 2022-05-30 | Disposition: A | Discharge status: Other Acute Inpatient Hospital | Payer: Medicaid Other | Attending: Student | Admitting: Student

## 2022-05-29 DIAGNOSIS — F32A Depression, unspecified: Secondary | ICD-10-CM | POA: Insufficient documentation

## 2022-05-29 DIAGNOSIS — Z91148 Patient's other noncompliance with medication regimen for other reason: Secondary | ICD-10-CM | POA: Insufficient documentation

## 2022-05-29 DIAGNOSIS — N914 Secondary oligomenorrhea: Secondary | ICD-10-CM | POA: Insufficient documentation

## 2022-05-29 DIAGNOSIS — Z79899 Other long term (current) drug therapy: Secondary | ICD-10-CM | POA: Insufficient documentation

## 2022-05-29 DIAGNOSIS — F200 Paranoid schizophrenia: Secondary | ICD-10-CM | POA: Insufficient documentation

## 2022-05-29 DIAGNOSIS — E221 Hyperprolactinemia: Secondary | ICD-10-CM | POA: Insufficient documentation

## 2022-05-29 DIAGNOSIS — Z1152 Encounter for screening for COVID-19: Secondary | ICD-10-CM | POA: Insufficient documentation

## 2022-05-29 LAB — RESP PANEL BY RT-PCR (FLU A&B, COVID) ARPGX2
Influenza A by PCR: NEGATIVE
Influenza B by PCR: NEGATIVE
SARS Coronavirus 2 by RT PCR: NEGATIVE

## 2022-05-29 LAB — POC SARS CORONAVIRUS 2 AG: SARSCOV2ONAVIRUS 2 AG: NEGATIVE

## 2022-05-29 MED ORDER — CARIPRAZINE HCL 1.5 MG PO CAPS
1.5000 mg | ORAL_CAPSULE | Freq: Every day | ORAL | Status: DC
  Administered 2022-05-30: 1.5 mg via ORAL
  Filled 2022-05-29 (×2): qty 1

## 2022-05-29 MED ORDER — HYDROXYZINE HCL 25 MG PO TABS: 50.0000 mg | ORAL_TABLET | Freq: Three times a day (TID) | ORAL | Status: DC | PRN

## 2022-05-29 MED ORDER — ALUM & MAG HYDROXIDE-SIMETH 200-200-20 MG/5ML PO SUSP: 30.0000 mL | ORAL | Status: DC | PRN

## 2022-05-29 MED ORDER — LITHIUM CARBONATE ER 300 MG PO TBCR
300.0000 mg | EXTENDED_RELEASE_TABLET | Freq: Two times a day (BID) | ORAL | Status: DC
  Administered 2022-05-30: 300 mg via ORAL
  Filled 2022-05-29 (×2): qty 1

## 2022-05-29 MED ORDER — OLANZAPINE 10 MG PO TBDP: 10.0000 mg | ORAL_TABLET | Freq: Three times a day (TID) | ORAL | Status: DC | PRN

## 2022-05-29 MED ORDER — ACETAMINOPHEN 325 MG PO TABS: 650.0000 mg | ORAL_TABLET | Freq: Four times a day (QID) | ORAL | Status: DC | PRN

## 2022-05-29 MED ORDER — MAGNESIUM HYDROXIDE 400 MG/5ML PO SUSP: 30.0000 mL | Freq: Every day | ORAL | Status: DC | PRN

## 2022-05-29 MED ORDER — ZIPRASIDONE MESYLATE 20 MG IM SOLR: 20.0000 mg | INTRAMUSCULAR | Status: DC | PRN

## 2022-05-29 MED ORDER — LORAZEPAM 1 MG PO TABS: 1.0000 mg | ORAL_TABLET | ORAL | Status: DC | PRN

## 2022-05-29 NOTE — Progress Notes (Signed)
[facility] LCSW Progress Note   05/29/2022    11:09 PM  Lorraine Bryant   [id number]   Type of Contact and Topic:  Psychiatric Bed Placement   Pt accepted to [facility]     Patient meets inpatient criteria per Beatriz Stallion, NP  The attending provider will be Jonelle Sports, MD   Call report to [phone]  Miguel Rota, RN @ [facility] notified.     Pt scheduled  to arrive at [place] 0900.    Mariea Clonts, MSW, LCSW-A  11:10 PM 05/29/2022

## 2022-05-29 NOTE — BH Assessment (Addendum)
Pt presenting to [facility] with her mother with cheif complaint of "manic/psychosis behaviors". Apparently, pt was up around 4am this morning making youtube vidoes and cursing. Around 7am pt left out the house in her PJs and mom called the police becasue she did not know what pt was doing. Mom reports that pateint is hyper, talking out of her head and cursing. Mom reports pt refused medications last Thursday. Pt reports God told her that she was pregnant and that is reason she stopped taking medications. Mom reports pt has seen providers and told her that she is not pregnant. Pt denies SI, HI, AVH and substance use. Pt appears manic during triage.  Mom awarded interim guardianship affective on 05/20/22. 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 Respondent was admitted to [facility] with paranoia and depression. She is currently on medications which do no seem to be helping her as she is still hearing voices and is applying for jobs that will put others in possible danger".   Pt is pacing in lobby, dancing and clapping.

## 2022-05-29 NOTE — ED Notes (Signed)
Pt sleeping@this time. Breathing even and unlabored. Will continue to monitor for safety 

## 2022-05-29 NOTE — ED Notes (Signed)
Pt was searched with no contraband found.  She was escorted onto the flex unit and oriented to bed 4.    Pt refused blood work and medications.  PT also refused to give a Urine spec at this time.  She was asked twice.

## 2022-05-29 NOTE — ED Notes (Signed)
IVC paperwork refaxed.

## 2022-05-29 NOTE — ED Notes (Signed)
Pt is currently in the bathroom.  Continues to act bizarre.  Stated that she thought she was [place] the spa.

## 2022-05-29 NOTE — BH Assessment (Signed)
Comprehensive Clinical Assessment (CCA) Note  05/29/2022 [name] [id number]  Disposition: Per Doran Heater, NP, patient recommended for inpatient psychiatric treatment. IVC'd by provider.  (Patient has a guardian and guardianship papers are in her chart).  Chief Complaint:  Chief Complaint  Patient presents with   Evaluation   Psychiatric Evaluation   Visit Diagnosis: Schizophrenia  Triage Note: Pt presenting to [facility] with her mother with cheif complaint of "manic/psychosis behaviors". Apparently, pt was up around 4am this morning making youtube vidoes and cursing. Around 7am pt left out the house in her PJs and mom called the police becasue she did not know what pt was doing. Mom reports that pateint is hyper, talking out of her head and cursing. Mom reports pt refused medications last Thursday. Pt reports God told her that she was pregnant and that is reason she stopped taking medications. Mom reports pt has seen providers and told her that she is not pregnant. Pt denies SI, HI, AVH and substance use. Pt appears manic during triage.     Mom awarded interim guardianship affective on 05/20/22. 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 Respondent was admitted to [facility] with paranoia and depression. She is currently on medications which do no seem to be helping her as she is still hearing voices and is applying for jobs that will put others in possible danger".  Pt is pacing in lobby, dancing and clapping.  TTS Note:  Patient presenting to the [facility] with her mother. Dx's with Schizophrenia in 2011. Upon approaching patient to complete her TTS assessment, she was observed standing at the back door, which was locked. She states, "I am trying to get out", "Please let me leave", "Open the door", "My mom is on the other side waiting for me". Patient has a bunted affect and very paranoid. Clinician with encouragement was able convince patient to come in the  assessment room to complete the TTS assessment.   She was asked what brought her here today and she responded, "I don't know, mom brought me, but I don't know why". She has poor insight on the reason leading to visit today. She was asked about her mental health history and with hesitancy tells me, "You already wrote that information down, look at your paper". She was shown a black sheet of paper and informed that nothing was written down. She then explains that in 2011 she was given a diagnosis of Schizophrenia.  According to patient during the time of her diagnosis she was in college at [employer]. Reportedly, she called her mother and told her that she was standing next to window, after she told her mother this information , the police came to her dorm, escorted her to the ER, and she was hospitalized for several days until she was psychiatrically stabilized. Patient was asked if she discharged with follow instructions and she says, "No". She also says that she hasn't taken medications since hospitalizations.   She denies SI, HI, and AVH's. Denies depressive symptoms and anxiety. She sleeps 7 hrs. Appetite is good. Patient states that she doesn't want to stay because she just started a new job. 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   During the TTS assessment patient is alert to person and place. She is dressed in casual clothing; disheveled. Her insight and judgement are poor. Speech is normal. Affect is blunted. She does not appear to be responding to internal  stimuli.       CCA Screening, Triage and Referral (STR)  Patient Reported Information How did you hear about [place]? Family/Friend  What Is the Reason for Your Visit/Call Today?  How Long Has This Been Causing You Problems? 1 wk - 1 month  What Do You Feel Would Help You the Most Today? Medication(s); Treatment for Depression or other mood problem   Have You Recently Had Any Thoughts  About Hurting Yourself? No  Are You Planning to Commit Suicide/Harm Yourself At This time? No   Have you Recently Had Thoughts About [place]? No  Are You Planning to Harm Someone at This Time? No  Explanation: No data recorded  Have You Used Any Alcohol or Drugs in the Past 24 Hours? No  How Long Ago Did You Use Drugs or Alcohol? No data recorded What Did You Use and How Much? No data recorded  Do You Currently Have a Therapist/Psychiatrist? Yes  Name of Therapist/Psychiatrist: Dr. Adele Schilder (noted 1 month ago)   Have You Been Recently Discharged From Any Office Practice or Programs? No  Explanation of Discharge From Practice/Program: No data recorded    CCA Screening Triage Referral Assessment Type of Contact: Face-to-Face  Telemedicine Service Delivery: Telemedicine service delivery: This service was provided via telemedicine using a 2-way, interactive audio and video technology  Is this Initial or Reassessment? Initial Assessment  Date Telepsych consult ordered in [facility]:  05/29/22  Time Telepsych consult ordered in [facility]:  2308  Location of Assessment: [facility]  Provider Location: [facility] Assessment Services   Collateral Involvement: Call placed to mother with pt's verbal permission. Mother was the petitioner for the IVC.   Does Patient Have a Stage manager Guardian? No  Legal Guardian Contact Information: No data recorded Copy of Legal Guardianship Form: No data recorded Legal Guardian Notified of Arrival: No data recorded Legal Guardian Notified of Pending Discharge: No data recorded If Minor and Not Living with Parent(s), Who has Custody? No data recorded Is CPS involved or ever been involved? Never  Is APS involved or ever been involved? Never   Patient Determined To Be At Risk for Harm To Self or Others Based on Review of Patient Reported Information or Presenting Complaint? No  Method: No data recorded Availability of  Means: No data recorded Intent: No data recorded Notification Required: No data recorded Additional Information for Danger to Others Potential: No data recorded Additional Comments for Danger to Others Potential: No data recorded Are There Guns or Other Weapons in Your Home? No data recorded Types of Guns/Weapons: No data recorded Are These Weapons Safely Secured?                            No data recorded Who Could Verify You Are Able To Have These Secured: No data recorded Do You Have any Outstanding Charges, Pending Court Dates, Parole/Probation? No data recorded Contacted To Inform of Risk of Harm To Self or Others: No data recorded   Does Patient Present under Involuntary Commitment? No  IVC Papers Initial File Date: 08/07/21   [place] of Residence: [place]   Patient Currently Receiving the Following Services: Medication Management   Determination of Need: Urgent (48 hours)   Options For Referral: Medication Management; Outpatient Therapy     CCA Biopsychosocial Patient Reported Schizophrenia/Schizoaffective Diagnosis in Past: Yes   Strengths: self-awareness   Mental Health Symptoms Depression:   None  Duration of Depressive symptoms:    Mania:   None   Anxiety:    Irritability; Restlessness   Psychosis:   None   Duration of Psychotic symptoms:  Duration of Psychotic Symptoms: Greater than six months   Trauma:   None   Obsessions:   None   Compulsions:   None   Inattention:   None   Hyperactivity/Impulsivity:   None   Oppositional/Defiant Behaviors:   None   Emotional Irregularity:   Mood lability   Other Mood/Personality Symptoms:   uta    Mental Status Exam Appearance and self-care  Stature:   Tall   Weight:   Overweight   Clothing:   Casual   Grooming:   Normal   Cosmetic use:   Age appropriate   Posture/gait:   Normal   Motor activity:   Not Remarkable   Sensorium  Attention:   Normal    Concentration:   Normal   Orientation:   Person; Place; Situation; Time   Recall/memory:   Normal   Affect and Mood  Affect:   Full Range; Appropriate   Mood:   Euthymic   Relating  Eye contact:   Normal   Facial expression:   Responsive   Attitude toward examiner:   Cooperative   Thought and Language  Speech flow:  Clear and Coherent   Thought content:   Appropriate to Mood and Circumstances   Preoccupation:   None   Hallucinations:   None   Organization:  No data recorded  [employer] of Knowledge:   Fair   Intelligence:   Average   Abstraction:   Functional   Judgement:   Fair   Dance movement psychotherapist:   Adequate   Insight:   Fair   Decision Making:   Confused   Social Functioning  Social Maturity:   Impulsive   Social Judgement:   Normal   Stress  Stressors:   Family conflict; Relationship   Coping Ability:   Overwhelmed; Exhausted   Skill Deficits:   Interpersonal; Decision making   Supports:   Family; Friends/Service system; Support needed     Religion: Religion/Spirituality Are You A Religious Person?: Yes What is Your Religious Affiliation?: Christian  Leisure/Recreation: Leisure / Recreation Do You Have Hobbies?: Yes Leisure and Hobbies: shopping, reading and eating at restaurants  Exercise/Diet: Exercise/Diet Do You Exercise?: No Have You Gained or Lost A Significant Amount of Weight in the Past Six Months?: No Do You Follow a Special Diet?: No Do You Have Any Trouble Sleeping?: No   CCA Employment/Education Employment/Work Situation: Employment / Work Situation Employment Situation: Unemployed Patient's Job has Been Impacted by Current Illness: Yes Has Patient ever Been in Equities trader?: No  Education: Education Is Patient Currently Attending School?: No Last Grade Completed: 16 Did You Product manager?: Yes What Type of College Degree Do you Have?: [employer], B.S. Social Work Did  [place] Have An Individualized Education Program (IIEP): No Did You Have Any Difficulty At [employer]?: No Patient's Education Has Been Impacted by Current Illness: No   CCA Family/Childhood History Family and Relationship History: Family history Marital status: Single Does patient have children?: No  Childhood History:  Childhood History By whom was/is the patient raised?: Both parents, Mother Did patient suffer any verbal/emotional/physical/sexual abuse as a child?: No Did patient suffer from severe childhood neglect?: No Has patient ever been sexually abused/assaulted/raped as an adolescent or adult?: No Was the patient ever a victim of a crime or a disaster?:  No Witnessed domestic violence?: No Has patient been affected by domestic violence as an adult?: No  Child/Adolescent Assessment:     CCA Substance Use Alcohol/Drug Use: Alcohol / Drug Use Pain Medications: see MAR Prescriptions: see MAR Over the Counter: see MAR History of alcohol / drug use?: No history of alcohol / drug abuse                         ASAM's:  Six Dimensions of Multidimensional Assessment  Dimension 1:  Acute Intoxication and/or Withdrawal Potential:      Dimension 2:  Biomedical Conditions and Complications:      Dimension 3:  Emotional, Behavioral, or Cognitive Conditions and Complications:     Dimension 4:  Readiness to Change:     Dimension 5:  Relapse, Continued use, or Continued Problem Potential:     Dimension 6:  Recovery/Living Environment:     ASAM Severity Score:    ASAM Recommended Level of Treatment:     Substance use Disorder (SUD)    Recommendations for Services/Supports/Treatments: Recommendations for Services/Supports/Treatments Recommendations For Services/Supports/Treatments: Individual Therapy, Medication Management  Discharge Disposition:    DSM5 Diagnoses: Patient Active Problem List   Diagnosis Date Noted   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

## 2022-05-29 NOTE — [place] Notes (Signed)
Patient has been denied by [facility] due to no appropriate beds available. Patient meets [place] inpatient criteria per Beatriz Stallion, NP. Patient has been faxed out to the following facilities:    [facility]  [place], [place] [address] [place]  [facility]  [address] [place] [address] [phone] [place]  [address], [place] [address] [phone]  [place]  [address], [place] [address] [phone] [place]  [address] [place] [address] [phone]  [facility]  [address] [place] [address] [phone] [facility]  [place], [place] [address] [phone]  [facility]  [address] [place], [place] [address] [phone]  [facility]  [phone] [address] [place] [address] [phone] [facility]  [place] [place] [address] [phone] [facility]  [address], [place] [address] [phone]  [facility]  [address], [place] [address] [place]  [facility]  [address], [place] [address] [phone] [place], MSW, LCSW-A  10:55 PM 05/29/2022

## 2022-05-29 NOTE — ED Notes (Signed)
Patient arrived in unit. Patient calm. Patient safe on unit with continued monitoring.

## 2022-05-29 NOTE — ED Notes (Signed)
Pt sitting in flex quietly.   IVC paper's have been faxed to magistrate.  Awaiting return fax.   Pt offered dinner but refused.

## 2022-05-29 NOTE — ED Provider Notes (Signed)
[facility] Continuous Assessment Admission H&P  Date: 05/29/22 Patient Name: Lorraine Bryant MRN: [id number] Chief Complaint:  Chief Complaint  Patient presents with   Evaluation      Diagnoses:  Final diagnoses:  None   HPI:  Lorraine Bryant is a 36 y.o. female, with PMH SCZ-paranoid type, multiple past IP psych admission (last time was ~3weeks ago at [facility]), who presented voluntary to [facility] (05/29/2022) with mom (interim legal guardian from 10/2-11/9 for psychosis and mania in the setting of medication nonadherence.  Home medications: Lithium 450 mg twice daily, Vraylar 1.5 mg daily.  Patient was supposed to receive Gean Birchwood, however she denied.   Patient stated she is here because "my mom called the police on me, and I am not sure why".  Stated that "all I did was go outside, to stand in the corner and get some fresh air".  She reported sleeping about 7 hours, from 12 AM to 4 AM.  When she woke up, she started making an Instagram video, in her room, which was when her mom came to her room and asked her how she was feeling.  Stated that she is unsure if she feels safe at home, stated that her grandfather has the keys to her house.  Stated that she has an alarm system.  However God talks to her, telling her to calm down.  Stated that God told her she has to carry a special pregnancy, getting pregnant without sex.  Her pregnancy is special because they are negative on test, however she notices an embryo because "there is a line on the ultrasound" as proof.  Other symptoms she endorses are galactorrhea and amenorrhea. Because of this, she needs to go around and spread "loveitude". Denied command auditory hallucination to hurt herself or other people.  Stated she also sees God from time to time.  Also has power to heal the blind.  When asked about past history and current medications, she has denied all.  Stated that she does not need to be in a psych  hospital and that she was to go home.  NW:GNFAOZHY Thoughts: No QM:VHQIONGEX Thoughts: No BMW:[id number]: Auditory, Visual, Command Description of Command Hallucinations: God telling her to spread "lovitude" and that her pregnancy is not normal. Denied command hallucinations to hurt self or others. Description of Auditory Hallucinations: Per above Description of Visual Hallucinations: Sees God Ideas of GUY:QIHK  Denied thought insertion, thought broadcasting, mind reading, mind control.  Mood:  ("good") Sleep:Poor (has not slept for ~4days) Appetite: Good  Review of Systems  Constitutional:  Negative for malaise/fatigue.  Eyes:  Negative for blurred vision.  Respiratory:  Negative for shortness of breath.   Cardiovascular:  Negative for chest pain.  Gastrointestinal:  Positive for diarrhea. Negative for abdominal pain, constipation, nausea and vomiting.  Genitourinary:  Negative for dysuria and frequency.       Reported amenorrhea and galactorrhea  Musculoskeletal:  Negative for myalgias.  Neurological:  Negative for dizziness, tremors, seizures and headaches.     Past Psychiatric History:  H/O inpatient at [facility], [facility]. Tried trazodone, Vistaril, Cogentin, Prolixin inj, Haldol, Abilify, olanzapine, Tegretol, [place] and [place].  Abilify caused restless, Latuda made nausea and olanzapine caused weight gain.   Family Psychiatric History:  Denied family history of suicide, inpatient psychiatric hospitalization, bipolar disorder, schizophrenia, substance use disorder.  Social History: Living: With mom Income: Denied Social support: Mom mom  Seizure/DT: Denied EtOH: Infrequent  use, unsure when last time was.  Denied blacking out, excessive drinking. Tobacco:  reports that she has never smoked. She has never used smokeless tobacco.  Cannabis: Denied Opiates: Denied Stimulants: Denied BZO/hypnotics: Denied    BP 117/86 (BP Location: Left  Arm)   Pulse 84   Temp 98.4 F (36.9 C) (Oral)   Resp 18   Ht 5\' 7"  (1.702 m)   Wt 187 lb (84.8 kg)   LMP  (LMP Unknown)   SpO2 100%   BMI 29.29 kg/m   Musculoskeletal  Strength & Muscle Tone: within normal limits Gait & Station: normal Patient leans: N/A   Physical Exam Vitals and nursing note reviewed.  Constitutional:      General: She is awake. She is not in acute distress.    Appearance: She is not ill-appearing or diaphoretic.  HENT:     Head: Normocephalic.  Pulmonary:     Effort: Pulmonary effort is normal. No respiratory distress.  Neurological:     Mental Status: She is alert.     Psychiatric Specialty Exam  Presentation  General Appearance:Appropriate for Environment, Casual, Fairly Groomed Eye Contact:Good Speech:Clear and Coherent, Slow Volume:Normal Handedness:Right  Mood and Affect  Mood: ("good") Affect:Appropriate, Congruent, Full Range  Thought Process  Thought Process:Coherent, Goal Directed, Irrevelant (thought blocking\) Descriptions of Associations:Circumstantial  Thought Content Suicidal Thoughts:Suicidal Thoughts: No Homicidal Thoughts:Homicidal Thoughts: No Hallucinations:Hallucinations: Auditory, Visual, Command Description of Command Hallucinations: God telling her to spread "lovitude" and that her pregnancy is not normal. Denied command hallucinations to hurt self or others. Description of Auditory Hallucinations: Per above Description of Visual Hallucinations: Sees God Ideas of Reference:None Thought Content:Scattered, Tangential, Rumination, Delusions, Illogical  Sensorium  Memory:Immediate Poor Judgment:Impaired Insight:None  Executive Functions  Orientation:Full (Time, Place and Person) Language:Good Concentration:Fair Attention:Good Recall:Good Fund of Knowledge:Good  Psychomotor Activity  Psychomotor Activity:Psychomotor Activity: Normal  Assets  Assets:Resilience, , Desire for Improvement,  Social Support  Sleep  Quality:Poor (has not slept for ~4days)  Is the patient at risk to self? Yes  Has the patient been a risk to self in the past 6 months? Yes .    Has the patient been a risk to self within the distant past? Yes   Is the patient a risk to others? Yes   Has the patient been a risk to others in the past 6 months? Yes   Has the patient been a risk to others within the distant past? Yes  No data recorded  PHQ 2-9:  Flowsheet Row ED from 08/07/2021 in [facility] Office Visit from 12/20/2019 in Center for [facility] Office Visit from 07/02/2017 in Center for [facility]  Thoughts that you would be better off dead, or of hurting yourself in some way Not at all Not at all Not at all  PHQ-9 Total Score 3 0 0       Flowsheet Row ED from 05/27/2022 in [facility] ED from 04/09/2022 in [facility] EMERGENCY DEPARTMENT Counselor from 09/26/2021 in BEHAVIORAL HEALTH OUTPATIENT THERAPY [place]  C-SSRS RISK CATEGORY No Risk No Risk No Risk        Past Medical History:  Past Medical History:  Diagnosis Date   Depression    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 Cleared by endocrinology per patient.    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   Psychosis (HCC)    per  pt and family last year    Past Surgical History:  Procedure Laterality Date   NO PAST SURGERIES     Family History:  Family History  Problem Relation Age of Onset   Drug abuse Father    Schizophrenia Maternal Aunt    Social History:  Social History   Socioeconomic History   Marital status: Single    Spouse name: Not on file   Number of children: Not on file   Years of education: Not on file   Highest education level: Not on file  Occupational History   Not on file  Tobacco Use   Smoking status: Never    Smokeless tobacco: Never  Vaping Use   Vaping Use: Never used  Substance and Sexual Activity   Alcohol use: Yes    Comment: occa   Drug use: No    Comment: unable to assess d/t pt not responding to questioning   Sexual activity: Never  Other Topics Concern   Not on file  Social History Narrative   Not on file   Social Determinants of Health   Financial Resource Strain: Not on file  Food Insecurity: No Food Insecurity (12/20/2019)   Hunger Vital Sign    Worried About Running Out of Food in the Last Year: Never true    Ran Out of Food in the Last Year: Never true  Transportation Needs: No Transportation Needs (12/20/2019)   PRAPARE - Administrator, Civil ServiceTransportation    Lack of Transportation (Medical): No    Lack of Transportation (Non-Medical): No  Physical Activity: Not on file  Stress: Not on file  Social Connections: Not on file  Intimate Partner Violence: Not on file   SDOH:  SDOH Screenings   Food Insecurity: No Food Insecurity (12/20/2019)  Transportation Needs: No Transportation Needs (12/20/2019)  Alcohol Screen: Low Risk  (08/21/2017)  Depression (PHQ2-9): Low Risk  (09/26/2021)  Tobacco Use: Low Risk  (05/27/2022)   Last Labs:  Admission on 04/09/2022, Discharged on 04/11/2022  Component Date Value Ref Range Status   Sodium 04/09/2022 140  135 - 145 mmol/L Final   Potassium 04/09/2022 3.6  3.5 - 5.1 mmol/L Final   Chloride 04/09/2022 105  98 - 111 mmol/L Final   CO2 04/09/2022 23  22 - 32 mmol/L Final   Glucose, Bld 04/09/2022 106 (H)  70 - 99 mg/dL Final   Glucose reference range applies only to samples taken after fasting for at least 8 hours.   BUN 04/09/2022 <5 (L)  6 - 20 mg/dL Final   Creatinine, Ser 04/09/2022 0.68  0.44 - 1.00 mg/dL Final   Calcium 40/98/119108/22/2023 9.8  8.9 - 10.3 mg/dL Final   Total Protein 47/82/956208/22/2023 7.3  6.5 - 8.1 g/dL Final   Albumin 13/08/657808/22/2023 3.9  3.5 - 5.0 g/dL Final   AST 46/96/295208/22/2023 17  15 - 41 U/L Final   ALT 04/09/2022 14  0 - 44 U/L Final   Alkaline  Phosphatase 04/09/2022 54  38 - 126 U/L Final   Total Bilirubin 04/09/2022 0.3  0.3 - 1.2 mg/dL Final   GFR, Estimated 04/09/2022 >60  >60 mL/min Final   Comment: (NOTE) Calculated using the CKD-EPI Creatinine Equation (2021)    Anion gap 04/09/2022 12  5 - 15 Final   Performed at [facility], [address] [place], [place] [id number]   WBC 04/09/2022 7.9  4.0 - 10.5 K/uL Final   RBC 04/09/2022 5.12 (H)  3.87 - 5.11 MIL/uL Final  Hemoglobin 04/09/2022 12.3  12.0 - 15.0 g/dL Final   HCT 16/38/4665 39.6  36.0 - 46.0 % Final   MCV 04/09/2022 77.3 (L)  80.0 - 100.0 fL Final   MCH 04/09/2022 24.0 (L)  26.0 - 34.0 pg Final   MCHC 04/09/2022 31.1  30.0 - 36.0 g/dL Final   RDW 99/35/7017 14.5  11.5 - 15.5 % Final   Platelets 04/09/2022 336  150 - 400 K/uL Final   nRBC 04/09/2022 0.0  0.0 - 0.2 % Final   Neutrophils Relative % 04/09/2022 59  % Final   Neutro Abs 04/09/2022 4.7  1.7 - 7.7 K/uL Final   Lymphocytes Relative 04/09/2022 31  % Final   Lymphs Abs 04/09/2022 2.5  0.7 - 4.0 K/uL Final   Monocytes Relative 04/09/2022 8  % Final   Monocytes Absolute 04/09/2022 0.7  0.1 - 1.0 K/uL Final   Eosinophils Relative 04/09/2022 1  % Final   Eosinophils Absolute 04/09/2022 0.1  0.0 - 0.5 K/uL Final   Basophils Relative 04/09/2022 1  % Final   Basophils Absolute 04/09/2022 0.0  0.0 - 0.1 K/uL Final   Immature Granulocytes 04/09/2022 0  % Final   Abs Immature Granulocytes 04/09/2022 0.02  0.00 - 0.07 K/uL Final   Performed at [facility], [address] [place], [place] [address]   Alcohol, Ethyl (B) 04/09/2022 <10  <10 mg/dL Final   Comment: (NOTE) Lowest detectable limit for serum alcohol is 10 mg/dL.  For medical purposes only. Performed at [facility], [address] [place], [place] [address]    Opiates 04/09/2022 NONE DETECTED  NONE DETECTED Final   Cocaine 04/09/2022 NONE DETECTED  NONE DETECTED Final   Benzodiazepines 04/09/2022 NONE DETECTED  NONE DETECTED  Final   Amphetamines 04/09/2022 NONE DETECTED  NONE DETECTED Final   Tetrahydrocannabinol 04/09/2022 NONE DETECTED  NONE DETECTED Final   Barbiturates 04/09/2022 NONE DETECTED  NONE DETECTED Final   Comment: (NOTE) DRUG SCREEN FOR MEDICAL PURPOSES ONLY.  IF CONFIRMATION IS NEEDED FOR ANY PURPOSE, NOTIFY LAB WITHIN 5 DAYS.  LOWEST DETECTABLE LIMITS FOR URINE DRUG SCREEN Drug Class                     Cutoff (ng/mL) Amphetamine and metabolites    1000 Barbiturate and metabolites    200 Benzodiazepine                 200 Tricyclics and metabolites     300 Opiates and metabolites        300 Cocaine and metabolites        300 THC                            50 Performed at [facility], [address] [place], [place] [address]    Salicylate Lvl 04/09/2022 <7.0 (L)  7.0 - 30.0 mg/dL Final   Performed at [facility], [address], [place], [place] [address]   Acetaminophen (Tylenol), Serum 04/09/2022 <10 (L)  10 - 30 ug/mL Final   Comment: (NOTE) Therapeutic concentrations vary significantly. A range of 10-30 ug/mL  may be an effective concentration for many patients. However, some  are best treated at concentrations outside of this range. Acetaminophen concentrations >150 ug/mL at 4 hours after ingestion  and >50 ug/mL at 12 hours after ingestion are often associated with  toxic reactions.  Performed at [facility], [place] [address], [place], [place] [address]    I-stat hCG, quantitative 04/09/2022 <5.0  <5 mIU/mL Final   Comment 3 04/09/2022          Final   Comment:   GEST. AGE      CONC.  (mIU/mL)   <=1 WEEK        5 - 50     2 WEEKS       50 - 500     3 WEEKS       100 - 10,000     4 WEEKS     1,000 - 30,000        FEMALE AND NON-PREGNANT FEMALE:     LESS THAN 5 mIU/mL    SARS Coronavirus 2 by RT PCR 04/10/2022 NEGATIVE  NEGATIVE Final   Comment: (NOTE) SARS-CoV-2 target nucleic acids are NOT DETECTED.  The SARS-CoV-2 RNA is generally detectable in  upper respiratory specimens during the acute phase of infection. The lowest concentration of SARS-CoV-2 viral copies this assay can detect is 138 copies/mL. A negative result does not preclude SARS-Cov-2 infection and should not be used as the sole basis for treatment or other patient management decisions. A negative result may occur with  improper specimen collection/handling, submission of specimen other than nasopharyngeal swab, presence of viral mutation(s) within the areas targeted by this assay, and inadequate number of viral copies(<138 copies/mL). A negative result must be combined with clinical observations, patient history, and epidemiological information. The expected result is Negative.  Fact Sheet for Patients:  EntrepreneurPulse.com.au  Fact Sheet for Healthcare Providers:  IncredibleEmployment.be  This test is no                          t yet approved or cleared by the [place] FDA and  has been authorized for detection and/or diagnosis of SARS-CoV-2 by FDA under an Emergency Use Authorization (EUA). This EUA will remain  in effect (meaning this test can be used) for the duration of the COVID-19 declaration under Section 564(b)(1) of the Act, 21 U.S.C.section 360bbb-3(b)(1), unless the authorization is terminated  or revoked sooner.       Influenza A by PCR 04/10/2022 NEGATIVE  NEGATIVE Final   Influenza B by PCR 04/10/2022 NEGATIVE  NEGATIVE Final   Comment: (NOTE) The Xpert Xpress SARS-CoV-2/FLU/RSV plus assay is intended as an aid in the diagnosis of influenza from Nasopharyngeal swab specimens and should not be used as a sole basis for treatment. Nasal washings and aspirates are unacceptable for Xpert Xpress SARS-CoV-2/FLU/RSV testing.  Fact Sheet for Patients: EntrepreneurPulse.com.au  Fact Sheet for Healthcare Providers: IncredibleEmployment.be  This test is not yet approved  or cleared by the [place] FDA and has been authorized for detection and/or diagnosis of SARS-CoV-2 by FDA under an Emergency Use Authorization (EUA). This EUA will remain in effect (meaning this test can be used) for the duration of the COVID-19 declaration under Section 564(b)(1) of the Act, 21 U.S.C. section 360bbb-3(b)(1), unless the authorization is terminated or revoked.  Performed at [facility], [place] [address] [place], [place] [address]    Allergies: Abilify [aripiprazole] PTA Medications: (Not in a hospital admission)   Medical Decision Making  Onetha Gaffey is a 36 y.o. female, with [place] SCZ-paranoid type, multiple past IP psych admission (last time was ~3weeks ago at [facility]), who presented voluntary to [facility] (05/29/2022) with mom (interim legal  guardian from 10/2-11/9) for psychosis and mania in the setting of medication nonadherence.  Home medications: Lithium 450 mg twice daily, Vraylar 1.5 mg daily.  Patient was supposed to receive Gean Birchwood, however she denied.  Schizophrenia-paranoid type She endorsed command auditory and visual hallucinations of God.  Also included symptoms of mania, including decreased need for sleep, increased energy, mood lability, agitation, flight of ideas for at least 4 days.  Symptoms emerged after medication nonadherence.  [facility] prior, since then mom has had difficulties getting patient to be medication adherent. Restarted home lithium at 300 mg BID Restarted home regular 1.5 mg daily Holding Invega, given possible galactorrhea and amenorrhea. Pending prolactin level  Guardian: mom (interim legal guardian from 10/2-11/9) Involuntary, affidavit and first exam completed 05/29/2022, pending magistrate  Dispo: Inpatient psych - appreciate CSW for assistance    Total Time spent with patient: 20 minutes Recommendations  Based on my evaluation the patient does  not appear to have an emergency medical condition.     Labs Reviewed  RESP PANEL BY RT-PCR (FLU A&B, COVID) ARPGX2  CBC WITH DIFFERENTIAL/PLATELET  COMPREHENSIVE METABOLIC PANEL  HEMOGLOBIN A1C  LIPID PANEL  TSH  PROLACTIN  HCG, SERUM, QUALITATIVE  LITHIUM LEVEL  POCT URINE DRUG SCREEN - MANUAL ENTRY (I-SCREEN)     Signed: Princess Bruins, DO Psychiatry Resident, PGY-2 [facility]  05/29/2022, 1:30 PM

## 2022-05-29 NOTE — ED Notes (Signed)
Pt A&O x 4, no distress noted, calm & cooperative, watching TV at present.,  Monitoring for safety.

## 2022-05-30 NOTE — Discharge Instructions (Addendum)
Dear Mordecai Maes,  Most effective treatment for your mental health disease involves BOTH a psychiatrist AND a therapist Psychiatrist to manage medications Therapist to help identify personal goals, barriers from those goals, and plan to achieve those goals by understanding emotions Please make regular appointments with an outpatient psychiatrist and other doctors once you leave the hospital (if any, otherwise, please see below for resources to make an appointment).  For therapy outside the hospital, please ask for these specific types of therapy: DBT ________________________________________________________  SAFETY CRISIS  Dial 988 for [place]    Text [phone] for Crisis Text Line:     [facility]:  [address] FIRST FLOOR.  [place], [place] [address].  [phone]  Mobile Crisis Response Teams Listed by counties in vicinity of [place]. [phone] [place] [phone] [place] [phone] [facility] [phone] [place] [phone] [place]. [phone] [place].  [place] [phone] ________________________________________________________  To see which pharmacy near you is the CHEAPEST for certain medications, please use GoodRx. It is free website and has a free phone app.    Also consider looking at [facility] $4.00 or [facility]'s $7.00 prescription list. Both are free to view if googled "[facility] $4 prescription" and "public's $7 prescription". These are set prices, no insurance required. [facility] low cost medications: $4-$15 for 30days  prescriptions or $10-$38 for 90days prescriptions  ________________________________________________________  Difficulties with sleep?   Can also use this free app for insomnia called CBT-I. Let your doctors and therapists know so they can help with extra tips and tricks or for guidance and accountability. NO ADDS on the app.     ________________________________________________________  Non-Emergent / Urgent  [facility] [address] [place], [place] [address] [phone] OUTPATIENT Walk-in information: Please note, all walk-ins are first come & first serve, with limited number of availability.  Please note that to be eligible for services you must bring: ID or a piece of mail with your name [facility] address  Therapist for therapy:  Monday & Wednesdays: Please ARRIVE at 7:15 AM for registration Will START at 8:00 AM Every 1st & 2nd Friday of the month: Please ARRIVE at 10:15 AM for registration Will START at 1 PM - 5 PM  Psychiatrist for medication management: Monday - Friday:  Please ARRIVE at 7:15 AM for registration Will START at 8:00 AM  Regretfully, due to limited availability, please be aware that you may not been seen on the same day as walk-in. Please consider making an appoint or try again. Thank you for your patience and understanding.

## 2022-05-30 NOTE — ED Provider Notes (Signed)
FBC/OBS ASAP Discharge Summary  Date and Time: 05/30/2022, 9:59 AM  Name: Lorraine Bryant  Age: 36 y.o.  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 female, with [place] SCZ-paranoid type, multiple past IP psych admission (last time was ~3weeks ago at [facility]), who presented voluntary to [facility] (05/29/2022) with mom (interim legal guardian from 10/2-11/9 for psychosis and mania in the setting of medication nonadherence.   Home medications: Lithium 450 mg twice daily, Vraylar 1.5 mg daily.             Patient was supposed to receive Kirt Boys, however she denied.   Patient stated she is here because "my mom called the police on me, and I am not sure why".  Stated that "all I did was go outside, to stand in the corner and get some fresh air".  She reported sleeping about 7 hours, from 12 AM to 4 AM.  When she woke up, she started making an Instagram video, in her room, which was when her mom came to her room and asked her how she was feeling.  Stated that she is unsure if she feels safe at home, stated that her grandfather has the keys to her house.  Stated that she has an alarm system.  However God talks to her, telling her to calm down.  Stated that God told her she has to carry a special pregnancy, getting pregnant without sex.  Her pregnancy is special because they are negative on test, however she notices an embryo because "there is a line on the ultrasound" as proof.  Other symptoms she endorses are galactorrhea and amenorrhea. Because of this, she needs to go around and spread "loveitude". Denied command auditory hallucination to hurt herself or other people.  Stated she also sees God from time to time.  Also has power to heal the blind.  When asked about past history and  current medications, she has denied all.  Stated that she does not need to be in a psych hospital and that she was to go home.   QZ:RAQTMAUQ Thoughts: No JF:HLKTGYBWL Thoughts: No SLH:TDSKAJGOTLXBWI: Auditory, Visual, Command Description of Command Hallucinations: God telling her to spread "lovitude" and that her pregnancy is not normal. Denied command hallucinations to hurt self or others. Description of Auditory Hallucinations: Per above Description of Visual Hallucinations: Sees God Ideas of OMB:TDHR  Denied thought insertion, thought broadcasting, mind reading, mind control.   Mood:  ("good") Sleep:Poor (has not slept for ~4days) Appetite: Good   Review of Systems  Constitutional:  Negative for malaise/fatigue.  Eyes:  Negative for blurred vision.  Respiratory:  Negative for shortness of breath.   Cardiovascular:  Negative for chest pain.  Gastrointestinal:  Positive for diarrhea. Negative for abdominal pain, constipation, nausea and vomiting.  Genitourinary:  Negative for dysuria and frequency.       Reported amenorrhea and galactorrhea  Musculoskeletal:  Negative for myalgias.  Neurological:  Negative for dizziness, tremors, seizures and headaches.      Past Psychiatric History:  H/O inpatient at [place], [facility]. Tried trazodone, Vistaril, Cogentin, Prolixin inj, Haldol, Abilify, olanzapine, Tegretol, [place] and [place].  Abilify caused restless, Latuda made nausea and olanzapine caused weight gain.    Family Psychiatric History:  Denied family history of  suicide, inpatient psychiatric hospitalization, bipolar disorder, schizophrenia, substance use disorder.   Social History: Living: With mom Income: Denied Social support: Mom mom   Seizure/DT: Denied EtOH: Infrequent use, unsure when last time was.  Denied blacking out, excessive drinking. Tobacco:  reports that she has never smoked. She has never used smokeless tobacco.  Cannabis:  Denied Opiates: Denied Stimulants: Denied BZO/hypnotics: Denied "  Subjective:  Patient was initially seen this AM, sitting up been bed with blanket wrapped around her shoulders, staring in a single direction, then saw eyes darting.  No acute distress.  Appeared guarded.  Patient stated that her mood, sleep and appetite are "fine".  That she has taken all of her scheduled medicines last night and should be able to go home.  Discussed with patient that she has not taken her medications per [facility].  Stated that she did not want to take these medications, because she knows that this will make her groggy.  Stated, "you know about the pregnancy that I was high about, it was just a dream, I will need to be here".  Discussed with patient that she will be going to inpatient psych, patient became visibly upset and tearful, stated that she does not want to go to the hospital.  IO:NGEXBMWU Thoughts: No XL:KGMWNUUVO Thoughts: No ZDG:UYQIHKVQQVZDGL:  (Patient declined to answer.  However appeared to be internally preoccupied, would stare in a direction, then eyes darting around) Description of Command Hallucinations: Declined to answer about command hallucinations Description of Auditory Hallucinations: Declined to answer about auditory hallucinations Description of Visual Hallucinations: Declined to answer about visual hallucinations Ideas of OVF:IEPP   Mood:  ("Fine") Sleep:Fair Appetite: Good  Review of Systems  Unable to perform ROS: Psychiatric disorder   Collateral: Updated mom about patient acceptance to [facility].   Stay Summary:  Lorraine Bryant is a 36 y.o. female with PMH SCZ-paranoid type, multiple past IP psych admission (last time was ~3weeks ago at [facility]), who presented voluntary to [facility] (05/29/2022) with mom (interim legal guardian from 10/2-11/9 for psychosis and mania in the setting of medication nonadherence.  Home medications: Lithium 450 mg twice  daily, Vraylar 1.5 mg daily.             Patient was supposed to receive Gean Birchwood, however she denied.  Schizophrenia-paranoid type She endorsed command auditory and visual hallucinations of God. Also included symptoms of mania, including decreased need for sleep, increased energy, mood lability, agitation, flight of ideas for at least 4 days. Endorsed delusions about being pregnant, which patient has been confirmed multiple times to be not pregnant. Symptoms emerged after medication nonadherence. She was at [facility] about 3 weeks prior, since then mom has had difficulties getting patient to be medication adherent. Restarted home lithium at 300 mg BID with plans to uptitrate to home dose of 450mg  BID Restarted home regular 1.5 mg daily Holding Invega, given possible galactorrhea and amenorrhea Recommend ACTT referral as patient has had multiple psychiatric hospitalization   Patient declined all scheduled medications and lab workup. She exhibited bizarre behaviors, thinking she was at a spa. No agitation or acute behavioral concerns during stay. She was calm.   Guardian: mom (interim legal guardian from 10/2-11/9) Involuntary, affidavit and first exam completed 05/29/2022, pending magistrate  Rx on DC: Lithium 450 mg twice daily, Vraylar 1.5 mg daily     Past Psychiatric History: Per H&P Past Medical History:  Past Medical History:  Diagnosis Date   Depression  Galactorrhoea with hyperprolactinemia (HCC) 03/17/2018   Likely d/t psych meds. Cleared by endocrinology per patient.    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   Psychosis (HCC)    per pt and family last year    Past Surgical History:  Procedure Laterality Date   NO PAST SURGERIES     Family History:  Family History  Problem Relation Age of Onset   Drug abuse Father    Schizophrenia Maternal Aunt    Family Psychiatric History: Per H&P Social History:  Social History   Substance and  Sexual Activity  Alcohol Use Yes   Comment: occa     Social History   Substance and Sexual Activity  Drug Use No   Comment: unable to assess d/t pt not responding to questioning    Social History   Socioeconomic History   Marital status: Single    Spouse name: Not on file   Number of children: Not on file   Years of education: Not on file   Highest education level: Not on file  Occupational History   Not on file  Tobacco Use   Smoking status: Never   Smokeless tobacco: Never  Vaping Use   Vaping Use: Never used  Substance and Sexual Activity   Alcohol use: Yes    Comment: occa   Drug use: No    Comment: unable to assess d/t pt not responding to questioning   Sexual activity: Never  Other Topics Concern   Not on file  Social History Narrative   Not on file   Social Determinants of Health   Financial Resource Strain: Not on file  Food Insecurity: No Food Insecurity (12/20/2019)   Hunger Vital Sign    Worried About Running Out of Food in the Last Year: Never true    Ran Out of Food in the Last Year: Never true  Transportation Needs: No Transportation Needs (12/20/2019)   PRAPARE - Administrator, Civil Service (Medical): No    Lack of Transportation (Non-Medical): No  Physical Activity: Not on file  Stress: Not on file  Social Connections: Not on file   SDOH:  SDOH Screenings   Food Insecurity: No Food Insecurity (12/20/2019)  Transportation Needs: No Transportation Needs (12/20/2019)  Alcohol Screen: Low Risk  (08/21/2017)  Depression (PHQ2-9): Low Risk  (09/26/2021)  Tobacco Use: Low Risk  (05/27/2022)   Tobacco Cessation:  N/A, patient does not currently use tobacco products  Current Medications:  Current Facility-Administered Medications  Medication Dose Route Frequency Provider Last Rate Last Admin   acetaminophen (TYLENOL) tablet 650 mg  650 mg Oral Q6H PRN Princess Bruins, DO       alum & mag hydroxide-simeth (MAALOX/MYLANTA) 200-200-20 MG/5ML  suspension 30 mL  30 mL Oral Q4H PRN Princess Bruins, DO       cariprazine (VRAYLAR) capsule 1.5 mg  1.5 mg Oral Daily Princess Bruins, DO       hydrOXYzine (ATARAX) tablet 50 mg  50 mg Oral TID PRN Princess Bruins, DO       lithium carbonate (LITHOBID) CR tablet 300 mg  300 mg Oral Q12H Princess Bruins, DO       OLANZapine zydis (ZYPREXA) disintegrating tablet 10 mg  10 mg Oral Q8H PRN Princess Bruins, DO       And   LORazepam (ATIVAN) tablet 1 mg  1 mg Oral PRN Princess Bruins, DO       And   ziprasidone (GEODON) injection 20 mg  20 mg Intramuscular PRN Princess Bruins, DO       magnesium hydroxide (MILK OF MAGNESIA) suspension 30 mL  30 mL Oral Daily PRN Princess Bruins, DO       Current Outpatient Medications  Medication Sig Dispense Refill   Calcium Carbonate Antacid (TUMS PO) Take 1-2 tablets by mouth daily as needed (For reflux or indigestion).     hydrOXYzine (VISTARIL) 25 MG capsule Take 1 capsule (25 mg total) by mouth at bedtime as needed. (Patient taking differently: Take 25 mg by mouth at bedtime as needed (For sleep).) 30 capsule 1   lithium carbonate (ESKALITH) 450 MG CR tablet Take 450 mg by mouth 2 (two) times daily.     paliperidone (INVEGA SUSTENNA) 156 MG/ML SUSY injection Inject 156 mg into the muscle every 30 (thirty) days.     VRAYLAR 1.5 MG capsule Take 1.5 mg by mouth daily.      PTA Medications: (Not in a hospital admission)     09/26/2021    1:27 PM 08/07/2021    1:53 PM 12/20/2019    2:31 PM  Depression screen PHQ 2/9  Decreased Interest 0 1 0  Down, Depressed, Hopeless 0 1 0  PHQ - 2 Score 0 2 0  Altered sleeping  0 0  Tired, decreased energy  0 0  Change in appetite  0 0  Feeling bad or failure about yourself   1 0  Trouble concentrating  0 0  Moving slowly or fidgety/restless  0 0  Suicidal thoughts  0 0  PHQ-9 Score  3 0  Difficult doing work/chores  Somewhat difficult     Flowsheet Row ED from 05/27/2022 in [facility] ED from  04/09/2022 in [facility] EMERGENCY DEPARTMENT Counselor from 09/26/2021 in BEHAVIORAL HEALTH OUTPATIENT THERAPY [place]  C-SSRS RISK CATEGORY No Risk No Risk No Risk       Musculoskeletal  Strength & Muscle Tone: within normal limits Gait & Station: normal Patient leans: N/A   Psychiatric Specialty Exam   Presentation  General Appearance:Disheveled (Wearing same outfit as yesterday) Eye Contact:Good (Intense, less blinking) Speech:Clear and Coherent, Normal Rate Volume:Normal Handedness:Right  Mood and Affect  Mood: ("Fine") Affect:Constricted, Non-Congruent, Labile, Tearful  Thought Process  Thought Process:Coherent Descriptions of Associations:Circumstantial  Thought Content Suicidal Thoughts:Suicidal Thoughts: No Homicidal Thoughts:Homicidal Thoughts: No Hallucinations:Hallucinations:  (Patient declined to answer.  However appeared to be internally preoccupied, would stare in a direction, then eyes darting around) Description of Command Hallucinations: Declined to answer about command hallucinations Description of Auditory Hallucinations: Declined to answer about auditory hallucinations Description of Visual Hallucinations: Declined to answer about visual hallucinations Ideas of Reference:None Thought Content:Illogical, Delusions (Paranoid, delusions about being pregnant, nonadherent to scheduled medicines.  Does not want to go to inpatient psych.)  Sensorium  Memory:Immediate Poor Judgment:Impaired Insight:None  Executive Functions  Orientation:Full (Time, Place and Person) Language:Good Concentration:Good Attention:Good Recall:Good Fund of Knowledge:Good  Psychomotor Activity  Psychomotor Activity:Psychomotor Activity: Normal  Assets  Assets:Communication Skills, Desire for Improvement, Housing, Social Support  Sleep  Quality:Fair  Physical Exam  BP 106/67   Pulse 83   Temp 98 F (36.7 C)   Resp 18   Ht 5\' 7"  (1.702 m)   Wt 187  lb (84.8 kg)   LMP  (LMP Unknown)   SpO2 100%   BMI 29.29 kg/m   Physical Exam Vitals and nursing note reviewed.  Constitutional:      General: She is awake. She is not in  acute distress.    Appearance: She is not ill-appearing or diaphoretic.  HENT:     Head: Normocephalic.  Pulmonary:     Effort: Pulmonary effort is normal. No respiratory distress.  Neurological:     Mental Status: She is alert.     Demographic Factors:  Unemployed  Loss Factors: NA  Historical Factors: Impulsivity  Risk Reduction Factors:   Living with another person, especially a relative and Positive social support  Continued Clinical Symptoms:  Bipolar Disorder:   Mixed State Currently Psychotic Unstable or Poor Therapeutic Relationship Previous Psychiatric Diagnoses and Treatments  Cognitive Features That Contribute To Risk:  Closed-mindedness, Loss of executive function, Polarized thinking, and Thought constriction (tunnel vision)    Suicide Risk:  Severe:  Frequent, intense, and enduring suicidal ideation, specific plan, no subjective intent, but some objective markers of intent (i.e., choice of lethal method), the method is accessible, some limited preparatory behavior, evidence of impaired self-control, severe dysphoria/symptomatology, multiple risk factors present, and few if any protective factors, particularly a lack of social support.  Plan Of Care/Follow-up recommendations:  Activity and diet at tolerated.  Please: Take all medications as prescribed by your mental healthcare provider. Report any adverse effects and or reactions from the medicines to your outpatient provider promptly. Do not engage in alcohol and or illegal drug use while on prescription medicines.  Disposition: [facility] for [facility] Accepting physician Estill Cottahomas Cornwall, MD   Total Time spent with patient: 15 minutes  Signed: Princess BruinsJulie Alastair Hennes, DO Psychiatry Resident, PGY-2 [facility]  [facility]/FBC 05/30/2022, 9:59 AM

## 2022-05-30 NOTE — ED Notes (Signed)
Report called to [facility].

## 2022-05-30 NOTE — ED Notes (Signed)
[employer] called to serve IVC papers.  Pending transport to [place] called [employer] Dept for transport.

## 2022-05-30 NOTE — ED Notes (Signed)
Attempted to call report to [facility] x3 with no answer. 

## 2022-05-30 NOTE — ED Notes (Signed)
Pt sleeping@this time. Breathing even and unlabored. Will continue to monitor for safety 

## 2022-06-11 ENCOUNTER — Inpatient Hospital Stay (HOSPITAL_COMMUNITY)
Admission: AD | Admit: 2022-06-11 | Discharge: 2022-06-11 | Disposition: A | Discharge status: Home / Self Care | Payer: Medicaid Other | Attending: Obstetrics & Gynecology | Admitting: Obstetrics & Gynecology

## 2022-06-11 DIAGNOSIS — F209 Schizophrenia, unspecified: Secondary | ICD-10-CM | POA: Insufficient documentation

## 2022-06-11 DIAGNOSIS — Z3202 Encounter for pregnancy test, result negative: Secondary | ICD-10-CM | POA: Insufficient documentation

## 2022-06-11 LAB — POCT PREGNANCY, URINE: Preg Test, Ur: NEGATIVE

## 2022-06-11 NOTE — MAU Note (Signed)
After patient walked out of triage she went to the MAU Lobby restroom. Registration notified and requested to call the unit when the patient walks out. CNM notified security of patient's status.   After coming out of the restroom the patient began knocking on the locked double doors. CNM notified and [facility] notified. While on the phone with [facility]. Patient walked off of the unit.

## 2022-06-11 NOTE — MAU Note (Addendum)
...  Lorraine Bryant is a 36 y.o. at Unknown here in MAU reporting: Vaginal bleeding that occurred from 10/18-10/20. She reports she had to wear a maxi pad and had to change it periodically throughout the day. Denies blood clots. She reports she has also been experiencing pelvic pain for the past two weeks as well as "movement." While in triage patient started rubbing her abdomen stating she felt the baby was moving. Patient states she desires to be pregnant and has been trying "for a while." Patient asking to view the rooms during her visit here for her delivery. Patient very excited during triage process.  UPT negative in MAU.  Patient hesitant to leave a urine sample and states, "but they keep showing up negative but I had an Ultrasound the other day and it showed something in my uterus." Patient reports she was seen at [facility].  LMP: Unsure. Reports irregular periods.   Pain score: 7/10 pelvic  Lab orders placed from triage:  UPT

## 2022-06-11 NOTE — MAU Note (Signed)
Patient adamant about her evidence of an intrauterine pregnancy. Patient stated she knows that God has told her this is a pregnancy. Both Dr. Ester Rink, MD, and Chrys Racer, [place], spoke with patient regarding her negative UPT results in MAU. Patient decided to leave after speaking with them both and walked out of triage.

## 2022-06-11 NOTE — MAU Provider Note (Signed)
Event Date/Time   First Provider Initiated Contact with Patient 06/11/22 [place]      S [name] is a 36 y.o. [id number] patient who presents to MAU today with complaint of pelvic pain. Patient states she has been having pelvic pain for the past 2 weeks. She rates the pain a 7/10 and has not tried anything for the pain.   Of note, patient has been seen at [facility] on 10/3 and 10/4 with negative UPT, HCG and negative ultrasound. She was then seen at [facility] ED with negative UPT and ultrasound and was most recently admitted to [facility] on 10/11 and IVC'd to [facility] due to mania and hallucinations.  Patient states she knows she is pregnant and has evidence on her ultrasound of a baby's head and eyes. Patient showed CNM ultrasound imaging and referenced area of thickened endometrium multiple times. CNM explained imaging findings at length and patient repeatedly states this doesn't make sense and references a friend with a similar situation that has a child now. CNM told patient that she is unable to speak to other person's circumstance and tried to redirect patient. Offered transfer to [facility] for further evaluation of pain and patient is adamant she is pregnant and needs to be seen here. CNM validated feelings and offered evaluation by ED physician for another opinion. Patient states "you are not a doctor. I want to see a doctor."  Dr. Harolyn Bryant brought to bedside to review results with patient. Patient adamant she has evidence of current pregnancy. MD requested to see ultrasound imaging and patient refused stating she "doesn't want anyone negating her truth." MD offered transfer to ED and validated patient's rights to have another opinion with another physician. Patient states she is going to "go do my own research about my own truth" and left the unit.   Important to note that patient had many questions regarding delivery at [facility]. Asked if patient's deliver in MAU and if she would see a  baby. Patient asked to see labor and delivery rooms while at [facility]. Patient was informed that this was not an option to tour facility but could see rooms on the online tour.   Patient then went into bathroom in lobby. Security notified of concerns regarding patient and questions in triage. Patient then came and was beating on the locked door of the unit. AC and security called to unit but before they could arrive, patient ambulated off the campus.   Patient information provided to security due to significant security concerns.   O BP 112/81 (BP Location: Right Arm)   Pulse 84   Temp 97.8 F (36.6 C) (Oral)   Resp 15   Ht 5\' 3"  (1.6 m)   LMP  (LMP Unknown)   SpO2 100%   BMI 33.13 kg/m  Physical Exam Vitals and nursing note reviewed.  Constitutional:      General: She is not in acute distress.    Appearance: She is well-developed.  HENT:     Head: Normocephalic.  Eyes:     Pupils: Pupils are equal, round, and reactive to light.  Cardiovascular:     Rate and Rhythm: Normal rate and regular rhythm.     Heart sounds: Normal heart sounds.  Pulmonary:     Effort: Pulmonary effort is normal. No respiratory distress.     Breath sounds: Normal breath sounds.  Abdominal:     General: Bowel sounds are normal. There is no distension.     Palpations: Abdomen is  soft.     Tenderness: There is no abdominal tenderness.  Skin:    General: Skin is warm and dry.  Neurological:     Mental Status: She is alert and oriented to person, place, and time.  Psychiatric:        Attention and Perception: She is inattentive.        Mood and Affect: Mood is anxious. Affect is angry.        Behavior: Behavior normal.        Thought Content: Thought content is paranoid and delusional.        Judgment: Judgment normal.    A Medical screening exam complete 1. Negative pregnancy test   2. Schizophrenia, unspecified type (HCC)      P Patient left unit after speaking with MD  Rolm Bookbinder,  CNM 06/11/2022 4:15 PM

## 2022-06-13 ENCOUNTER — Ambulatory Visit (INDEPENDENT_AMBULATORY_CARE_PROVIDER_SITE_OTHER): Payer: Medicaid Other | Admitting: Obstetrics and Gynecology

## 2022-06-13 ENCOUNTER — Encounter: Payer: Self-pay | Admitting: Obstetrics and Gynecology

## 2022-06-13 VITALS — BP 114/76 | HR 60

## 2022-06-13 DIAGNOSIS — F458 Other somatoform disorders: Secondary | ICD-10-CM | POA: Diagnosis not present

## 2022-06-13 NOTE — Progress Notes (Signed)
Pt states she is pregnant and would like to review her [place] from [facility]

## 2022-06-13 NOTE — Progress Notes (Signed)
Obstetrics and Gynecology Visit New Patient Evaluation  Appointment Date: 06/13/2022  OBGYN Clinic: [facility]  Referral Provider: self  Chief Complaint: 3rd opinion re: concern for pregnancy  History of Present Illness:  Lorraine Bryant is a 36 y.o. G0 with above CC. Patient went to [facility] on 10/3 with LMP June 2023 and stating that home UPTs from June through October were all positive. Ultrasound done there and all normal (see care everywhere) and beta quant also negative; per that note, the patient stated "God told her that her pregnancy was not typical". She had a negative quant on 8/22 at [facility] were she had an IVC.  She went to [facility] ED on 10/9 for possible pregnancy and pelvic pain. She declined labwork and an u/s there showed a normal non pregnant uterus. She was seen in [facility] triage on 10/24 for pelvic pain and patient left after speaking to MD  Today, she states she continues to have nausea, breast tenderness and believes that she is pregnant  Review of Systems:  as noted in the History of Present Illness.   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 Current Outpatient Medications  Medication Sig Dispense Refill   Calcium Carbonate Antacid (TUMS PO) Take 1-2 tablets by mouth daily as needed (For reflux or indigestion).     hydrOXYzine (VISTARIL) 25 MG capsule Take 1 capsule (25 mg total) by mouth at bedtime as needed. (Patient taking differently: Take 25 mg by mouth at bedtime as needed (For sleep).) 30 capsule 1   lithium carbonate (ESKALITH) 450 MG CR tablet Take 450 mg by mouth 2 (two) times daily.     paliperidone (INVEGA SUSTENNA) 156 MG/ML SUSY injection Inject 156 mg into the muscle every 30 (thirty) days.      VRAYLAR 1.5 MG capsule Take 1.5 mg by mouth daily.     No current facility-administered medications for this visit.    Allergies: is allergic to abilify [aripiprazole].  Physical Exam:  BP 114/76   Pulse 60   LMP  (LMP Unknown)  There is no height or weight on file to calculate BMI. General appearance: Well nourished, well developed female in no acute distress.  Neuro/Psych:  Normal mood and affect.    Assessment: patient stable  Plan:  1. Pseudocyesis I reviewed the u/s images from 10/9 and also showed her images online of normal, non pregnant uteri; I also reviewed the u/s read from her [facility] visit in early October. I told her that all tests indicate that she is not pregnant despite her s/s. She would like another u/s but I told her I don't feel that this is indicated unless a blood hcg test today is now positive; pt declines blood or urine work today.  I did tell her that we can see her back or she can see her [facility] doctor for a GYN visit to investigate her pelvic pain and to do a pap smear and I also told her I recommend she see her PCP to investigate other causes for her pelvic pain such as GI or urinary.   Patient spoken to with her mother present.   35 minutes of face to face interaction with patient regarding her labs, images, history.   RTC: PRN  Durene Romans MD Attending Center for  Nurse, mental health Fish farm manager)

## 2022-07-08 ENCOUNTER — Ambulatory Visit: Payer: Medicaid Other | Admitting: Obstetrics and Gynecology

## 2022-07-19 ENCOUNTER — Ambulatory Visit: Payer: Medicaid Other | Admitting: Obstetrics and Gynecology

## 2022-12-19 ENCOUNTER — Inpatient Hospital Stay (HOSPITAL_COMMUNITY)
Admission: AD | Admit: 2022-12-19 | Discharge: 2022-12-19 | Disposition: A | Discharge status: Home / Self Care | Payer: Medicaid Other | Attending: Obstetrics and Gynecology | Admitting: Obstetrics and Gynecology

## 2022-12-19 DIAGNOSIS — Z79899 Other long term (current) drug therapy: Secondary | ICD-10-CM | POA: Insufficient documentation

## 2022-12-19 DIAGNOSIS — F32A Depression, unspecified: Secondary | ICD-10-CM | POA: Diagnosis not present

## 2022-12-19 DIAGNOSIS — R1084 Generalized abdominal pain: Secondary | ICD-10-CM | POA: Insufficient documentation

## 2022-12-19 DIAGNOSIS — O26899 Other specified pregnancy related conditions, unspecified trimester: Secondary | ICD-10-CM | POA: Diagnosis present

## 2022-12-19 DIAGNOSIS — F200 Paranoid schizophrenia: Secondary | ICD-10-CM | POA: Insufficient documentation

## 2022-12-19 DIAGNOSIS — Z3202 Encounter for pregnancy test, result negative: Secondary | ICD-10-CM | POA: Diagnosis not present

## 2022-12-19 DIAGNOSIS — F458 Other somatoform disorders: Secondary | ICD-10-CM

## 2022-12-19 NOTE — MAU Note (Addendum)
Registration called nurses station stating the patient was complaining of intense abdominal pain and that her water broke. Patient brought directly into room due to intensity of pain. Misty Stanley, CNM at bedside.  Patient reports that abdominal pain began around 20 minutes ago and comes and goes.

## 2022-12-19 NOTE — MAU Note (Signed)
Patient refused to go to ED, ED notified.

## 2022-12-19 NOTE — MAU Provider Note (Cosign Needed Addendum)
Chief Complaint: Possible Pregnancy   None     SUBJECTIVE HPI: Lorraine Bryant is a 37 y.o. G0P0000 with hx significant for paranoid schizophrenia who presents to maternity admissions crying loudly in the lobby and reporting her water is broken and she has an urge to push.  Her mother is present and reports that the patient is not pregnant.  Pt reports she knows she is pregnant and feels her baby move.   HPI  Past Medical History:  Diagnosis Date  . Depression   . 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 Cleared by endocrinology per patient.   . Hallucinations   . Paranoid schizophrenia (HCC) 07/15/2017  . Psychosis (HCC)    per pt and family last year   Past Surgical History:  Procedure Laterality Date  . NO PAST SURGERIES     Social History   Socioeconomic History  . Marital status: Single    Spouse name: Not on file  . Number of children: Not on file  . Years of education: Not on file  . Highest education level: Not on file  Occupational History  . Not on file  Tobacco Use  . Smoking status: Never  . Smokeless tobacco: Never  Vaping Use  . Vaping Use: Never used  Substance and Sexual Activity  . Alcohol use: Yes    Comment: occa  . Drug use: No    Comment: unable to assess d/t pt not responding to questioning  . Sexual activity: Never  Other Topics Concern  . Not on file  Social History Narrative  . Not on file   Social Determinants of Health   Financial Resource Strain: Not on file  Food Insecurity: No Food Insecurity (12/20/2019)   Hunger Vital Sign   . Worried About Running Out of Food in the Last Year: Never true   . Ran Out of Food in the Last Year: Never true  Transportation Needs: No Transportation Needs (12/20/2019)   PRAPARE - Transportation   . Lack of Transportation (Medical): No   . Lack of Transportation (Non-Medical): No  Physical Activity: Not on file  Stress: Not on file  Social Connections: Not on  file  Intimate Partner Violence: Not on file   No current facility-administered medications on file prior to encounter.   Current Outpatient Medications on File Prior to Encounter  Medication Sig Dispense Refill  . Calcium Carbonate Antacid (TUMS PO) Take 1-2 tablets by mouth daily as needed (For reflux or indigestion).    . hydrOXYzine (VISTARIL) 25 MG capsule Take 1 capsule (25 mg total) by mouth at bedtime as needed. (Patient taking differently: Take 25 mg by mouth at bedtime as needed (For sleep).) 30 capsule 1  . lithium carbonate (ESKALITH) 450 MG CR tablet Take 450 mg by mouth 2 (two) times daily.    . paliperidone (INVEGA SUSTENNA) 156 MG/ML SUSY injection Inject 156 mg into the muscle every 30 (thirty) days.    . VRAYLAR 1.5 MG capsule Take 1.5 mg by mouth daily.     Allergies  Allergen Reactions  . Abilify [Aripiprazole] Other (See Comments)    Slow moving and hand cramping up    ROS:  Review of Systems  Constitutional:  Negative for chills, fatigue and fever.  Respiratory:  Negative for shortness of breath.   Cardiovascular:  Negative for chest pain.  Gastrointestinal:  Positive for abdominal pain.  Genitourinary:  Negative for difficulty urinating, dysuria, flank pain, pelvic pain, vaginal bleeding, vaginal   discharge and vaginal pain.  Neurological:  Negative for dizziness and headaches.  Psychiatric/Behavioral: Negative.       I have reviewed patient's Past Medical Hx, Surgical Hx, Family Hx, Social Hx, medications and allergies.   Physical Exam  Patient Vitals for the past 24 hrs:  BP Temp Pulse Resp SpO2  12/19/22 0237 (!) 149/94 98.6 F (37 C) 83 18 100 %   Constitutional: Well-developed, well-nourished female in significant distress.  Cardiovascular: normal rate Respiratory: normal effort GI: Abd soft, non-tender. Pos BS x 4 MS: Extremities nontender, no edema, normal ROM Neurologic: Alert and oriented x 4.  GU: Neg CVAT.  Cervix closed, thick,  firm   Bedside US reveals full bladder, nongravid uterus with endometrial stripe  LAB RESULTS No results found for this or any previous visit (from the past 24 hour(s)).     IMAGING No results found.  MAU Management/MDM: Orders Placed This Encounter  Procedures  . Discharge patient    No orders of the defined types were placed in this encounter.   Pt with paranoid schizophrenia and previous documented episode of pseudocyesis and similar presentation 05/2022.  She is unable to give urine so bedside US performed, and uterus appears nongravid. Pt declines giving urine or blood sample and requests a c-section for the pain.  Called [facility] and spoke with MD, who accepts transfer of care so istat can be performed to verify that pt is not pregnant and pt can be managed for pain in the absence of pregnancy.  Pt declined transfer to ED when wheelchair brought into room.  CNM went into room and discussed the bedside US results again and why a blood or urine test could detect an early pregnancy not seen on US.  Pt has US pictures on her phone with slightly enlarged uterus but not c/w pregnancy, ? Fibroid.  Pt initially reports her doctor is on [address], but then states that this is was the emergency room. Visit and US documented at [facility] on [address], off of [address] on 05/27/22.  I offered to have psychiatric evaluation via Telepsych done in the MAU if pt does not want to go to the ED. Pt decides she does not want this evaluation but wants to know why she has nausea and abdominal pain.  Pt agrees to gyn follow up with hormonal labs outpatient with CNM and prefers discharge home at this time and not transfer to ED.  Pt declines serum or urine pregnancy test at time of discharge and walked out of unit, stable and calm, along with her mother.   ASSESSMENT 1. Pseudocyesis   2. Generalized abdominal pain   3. Schizophrenia, paranoid (HCC)     PLAN Discharge  home Allergies as of 12/19/2022       Reactions   Abilify [aripiprazole] Other (See Comments)   Slow moving and hand cramping up        Medication List     TAKE these medications    hydrOXYzine 25 MG capsule Commonly known as: VISTARIL Take 1 capsule (25 mg total) by mouth at bedtime as needed. What changed: reasons to take this   Invega Sustenna 156 MG/ML Susy injection Generic drug: paliperidone Inject 156 mg into the muscle every 30 (thirty) days.   lithium carbonate 450 MG ER tablet Commonly known as: ESKALITH Take 450 mg by mouth 2 (two) times daily.   TUMS PO Take 1-2 tablets by mouth daily as needed (For reflux or indigestion).     Vraylar 1.5 MG capsule Generic drug: cariprazine Take 1.5 mg by mouth daily.         Monroe Qin Leftwich-Kirby Certified Nurse-Midwife 12/19/2022  3:08 AM     

## 2022-12-19 NOTE — MAU Note (Addendum)
Report given to ED charge Irving Burton, RN.

## 2022-12-20 NOTE — MAU Provider Note (Signed)
Attestation of Attending Supervision of Advanced Practice Provider (CNM/NP/PA): Evaluation and management procedures were performed by the Advanced Practice Provider under my supervision and collaboration.  I have reviewed the Advanced Practice Provider's note and chart, and I agree with the management and plan. I have also made any necessary editorial changes.   Lennart Pall, MD Attending Obstetrician & Gynecologist, [facility], [facility] 12/20/2022 1:54 AM   Chief Complaint: Possible Pregnancy   None     SUBJECTIVE HPI: Lorraine Bryant is a 37 y.o. G0P0000 with hx significant for paranoid schizophrenia who presents to maternity admissions crying loudly in the lobby and reporting her water is broken and she has an urge to push.  Her mother is present and reports that the patient is not pregnant.  Pt reports she knows she is pregnant and feels her baby move.   Possible Pregnancy Associated symptoms include abdominal pain. Pertinent negatives include no chest pain, chills, fatigue, fever or headaches.    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 Cleared by endocrinology per patient.    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   Psychosis (HCC)    per pt and family last year   Past Surgical History:  Procedure Laterality Date   NO PAST SURGERIES     Social History   Socioeconomic History   Marital status: Single    Spouse name: Not on file   Number of children: Not on file   Years of education: Not on file   Highest education level: Not on file  Occupational History   Not on file  Tobacco Use   Smoking status: Never   Smokeless tobacco: Never  Vaping Use   Vaping Use: Never used  Substance and Sexual Activity   Alcohol use: Yes    Comment: occa   Drug use: No    Comment: unable to assess d/t pt not  responding to questioning   Sexual activity: Never  Other Topics Concern   Not on file  Social History Narrative   Not on file   Social Determinants of Health   Financial Resource Strain: Not on file  Food Insecurity: No Food Insecurity (12/20/2019)   Hunger Vital Sign    Worried About Running Out of Food in the Last Year: Never true    Ran Out of Food in the Last Year: Never true  Transportation Needs: No Transportation Needs (12/20/2019)   PRAPARE - Administrator, Civil Service (Medical): No    Lack of Transportation (Non-Medical): No  Physical Activity: Not on file  Stress: Not on file  Social Connections: Not on file  Intimate Partner Violence: Not on file   No current facility-administered medications on file prior to encounter.   Current Outpatient Medications on File Prior to Encounter  Medication Sig Dispense Refill   Calcium Carbonate Antacid (TUMS PO) Take 1-2 tablets by mouth daily as needed (For reflux or indigestion).     hydrOXYzine (VISTARIL) 25 MG capsule Take 1 capsule (25 mg total) by mouth at bedtime as needed. (Patient taking differently: Take 25 mg by mouth at bedtime as needed (For sleep).) 30 capsule 1   lithium carbonate (ESKALITH) 450 MG CR tablet Take 450 mg by mouth 2 (two) times daily.     paliperidone (INVEGA SUSTENNA) 156 MG/ML SUSY injection Inject 156 mg into the muscle every  30 (thirty) days.     VRAYLAR 1.5 MG capsule Take 1.5 mg by mouth daily.     Allergies  Allergen Reactions   Abilify [Aripiprazole] Other (See Comments)    Slow moving and hand cramping up    ROS:  Review of Systems  Constitutional:  Negative for chills, fatigue and fever.  Respiratory:  Negative for shortness of breath.   Cardiovascular:  Negative for chest pain.  Gastrointestinal:  Positive for abdominal pain.  Genitourinary:  Negative for difficulty urinating, dysuria, flank pain, pelvic pain, vaginal bleeding, vaginal discharge and vaginal pain.   Neurological:  Negative for dizziness and headaches.  Psychiatric/Behavioral: Negative.       I have reviewed patient's Past Medical Hx, Surgical Hx, Family Hx, Social Hx, medications and allergies.   Physical Exam  Patient Vitals for the past 24 hrs:  BP Temp Pulse Resp SpO2  12/19/22 0237 (!) 149/94 98.6 F (37 C) 83 18 100 %    Constitutional: Well-developed, well-nourished female in significant distress.  Cardiovascular: normal rate Respiratory: normal effort GI: Abd soft, non-tender. Pos BS x 4 MS: Extremities nontender, no edema, normal ROM Neurologic: Alert and oriented x 4.  GU: Neg CVAT.  Cervix closed, thick, firm   Bedside US reveals full bladder, nongravid uterus with endometrial stripe  LAB RESULTS No results found for this or any previous visit (from the past 24 hour(s)).     IMAGING No results found.  MAU Management/MDM: Orders Placed This Encounter  Procedures   Discharge patient    No orders of the defined types were placed in this encounter.   Pt with paranoid schizophrenia and previous documented episode of pseudocyesis and similar presentation 05/2022.  She is unable to give urine so bedside US performed, and uterus appears nongravid. Pt declines giving urine or blood sample and requests a c-section for the pain.  Called [facility] and spoke with MD, who accepts transfer of care so istat can be performed to verify that pt is not pregnant and pt can be managed for pain in the absence of pregnancy.  Pt stable at the time of transfer. Pt mother present for transfer.   ASSESSMENT 1. Pseudocyesis   2. Generalized abdominal pain   3. Schizophrenia, paranoid (HCC)     PLAN Discharge home Allergies as of 12/19/2022       Reactions   Abilify [aripiprazole] Other (See Comments)   Slow moving and hand cramping up        Medication List     TAKE these medications    hydrOXYzine 25 MG capsule Commonly known as: VISTARIL Take 1 capsule (25 mg total)  by mouth at bedtime as needed. What changed: reasons to take this   Gean [place]ch[place] 156 MG/ML Susy injection Generic drug: paliperidone Inject 156 mg into the muscle every 30 (thirty) days.   lithium carbonate 450 MG ER tablet Commonly known as: ESKALITH Take 450 mg by mouth 2 (two) times daily.   TUMS PO Take 1-2 tablets by mouth daily as needed (For reflux or indigestion).   Vraylar 1.5 MG capsule Generic drug: cariprazine Take 1.5 mg by mouth daily.         Sharen Counter Certified Nurse-Midwife 12/20/2022  1:54 AM

## 2022-12-25 ENCOUNTER — Ambulatory Visit: Payer: Medicaid Other

## 2022-12-25 NOTE — Progress Notes (Signed)
Patient presented for a UPT. Upon arrival I went to the lab to dip patients urine for pregnancy prior to pulling her back. I could not find the patient's urine in any of the bathrooms, so I called to the front desk to see if they could send patient (addressed by last name) to the bathroom. I was informed that the patient had not been able to urinate to provide her sample. At this time, the patient came to the door of the lab and asked if I had called her name. I told her yes. At this time Reggie Pile comes down to the lab from the break room getting water for the patient. Reggie Pile then states that she has already informed patient of the protocol to do a pregnancy confirmation by urine. Patient was requesting an u/s and states that she has already had an u/s and that there was a baby.  I informed patient to follow me, so that I can look into her chart to get a better idea of what was going on. I confirmed with patient the ED visit from back in Oct 23 and the most recent visit on 5/6 at MAU.   Patient stated that she had an u/s that had a baby on it. I reviewed u/s report with patient to confirm what she was stating. I informed patient that her u/s was normal.  I spent quite a bit of time allowing patient to voice her concerns and validated her feelings. I explained to her that her test has been negative and that the only way that we will confirm if this is a new pregnancy is by her producing [place] a urine to test for pregnancy. Patient declined to provide urine. Patient then asked for beta hcg. Per Dr. Sherre Poot no beta needed at this time and for patient to try to provide [place] with a urine specimen.  Patient then left office.

## 2023-02-04 ENCOUNTER — Ambulatory Visit (INDEPENDENT_AMBULATORY_CARE_PROVIDER_SITE_OTHER): Payer: Medicaid Other | Admitting: Advanced Practice Midwife

## 2023-02-04 ENCOUNTER — Encounter: Payer: Self-pay | Admitting: Advanced Practice Midwife

## 2023-02-04 VITALS — BP 121/80 | HR 86 | Ht 63.0 in | Wt 230.6 lb

## 2023-02-04 DIAGNOSIS — F200 Paranoid schizophrenia: Secondary | ICD-10-CM

## 2023-02-04 DIAGNOSIS — Z113 Encounter for screening for infections with a predominantly sexual mode of transmission: Secondary | ICD-10-CM

## 2023-02-04 DIAGNOSIS — Z01419 Encounter for gynecological examination (general) (routine) without abnormal findings: Secondary | ICD-10-CM | POA: Diagnosis not present

## 2023-02-04 DIAGNOSIS — N914 Secondary oligomenorrhea: Secondary | ICD-10-CM | POA: Diagnosis not present

## 2023-02-04 DIAGNOSIS — F458 Other somatoform disorders: Secondary | ICD-10-CM

## 2023-02-04 NOTE — Progress Notes (Signed)
Subjective:     Lorraine Bryant is a 37 y.o. female with medical hx significant for schizophrenia here at [facility] for a routine exam.  Current complaints: Pt believes she is pregnant. Reports that God told her that this was a special pregnancy.  Pt has history of pseudocyesis, with negative pregnancy tests and [place] with fibroids with no IUP noted in 2023.  At time of MAU visit on 12/19/22, pt declined to give urine or blood sample for testing but bedside US with nongravid appearing uterus.  Pt reports she feels movement, and has n/v almost every day.  Pt reports no period in several months, but her mother, who is present today reports she had a light period ~ 1 month ago.  Pt mother also reports she had a baby shower on Saturday with ultrasound pictures from 1 year ago. Pt reports last intercourse was 1 year ago.  Personal health questionnaire reviewed: yes.  Pt declines pelvic exam and breast exam today but consents to labwork to evaluate her pregnancy symptoms.  Do you have a primary care provider? yes Do you feel safe at home? yes  Flowsheet Row ED from 08/07/2021 in [facility] at [facility] Total Score 2       Health Maintenance Due  Topic Date Due   COVID-19 Vaccine (1) Never done   Hepatitis C Screening  Never done   PAP SMEAR-Modifier  11/02/2022     Risk factors for chronic health problems: Smoking: Alchohol/how much: Pt BMI: Body mass index is 40.85 kg/m.   Gynecologic History No LMP recorded. (Menstrual status: Irregular Periods). Contraception: none Last Pap: Pt reports Pap within 3 years, Pt mother unsure.  Pt declines today. Some records received from PCP but will request Pap if done.  .  Last mammogram: n/a.   Obstetric History OB History  Gravida Para Term Preterm AB Living  0 0 0 0 0 0  SAB IAB Ectopic Multiple Live Births  0 0 0 0 0     The following portions of the patient's history were reviewed and updated as  appropriate: allergies, current medications, past family history, past medical history, past social history, past surgical history, and problem list.  Review of Systems Pertinent items noted in HPI and remainder of comprehensive ROS otherwise negative.    Objective:  BP 121/80   Pulse 86   Ht 5\' 3"  (1.6 m)   Wt 230 lb 9.6 oz (104.6 kg)   BMI 40.85 kg/m   VS reviewed, nursing note reviewed,  Constitutional: well developed, well nourished, no distress HEENT: normocephalic, thyroid without enlargement or mass HEART: RRR, no murmurs rubs/gallops RESP: clear and equal to auscultation bilaterally in all lobes  Breast Exam:  Deferred/declined by patient with low risks and shared decision making, discussed recommendation to start mammogram between 40-50 yo/  Abdomen: soft Neuro: alert and oriented x 3 Skin: warm, dry Psych: affect normal Pelvic exam: Deferred/declined by pt     Assessment/Plan:   1. Encounter for annual routine gynecological examination --Pt declines pelvic exam, pap, breast exam, STI screening today  2. Secondary oligomenorrhea --Last menses last month per pt mother, unsure of dates --Hx irregular menses, pt reports she is pregnant and seeks care (see below) but pregnancy tests negative   3. Schizophrenia, paranoid (HCC) --Pt has PCP and specialty care and reports she has counselor she sees regularly.   4. Pseudocyesis --Multiple ED visits and MAU visit on 12/19/22 reporting she is  pregnant.   --Pt reports patient had a baby shower over the weekend and has ultrasound photos dated 1 year ago --Discussion on 12/19/22 with CNM in MAU about negative tests, and pt agreed to Gyn visit in the office for follow up on symptoms.   --Pt continues to report pregnancy today but is pleasant and agrees to testing for other causes for symptoms.    Return in about 1 month (around 03/06/2023) for Follow up with me after ultrasound for results. Sharen Counter, CNM 12:44 PM

## 2023-02-04 NOTE — Progress Notes (Signed)
New Patient is in the office with her mother, pt is here to establish care and f/u from ED visit in May. Pt denies any pain today. Hx of irregular cycles, pt's mother reports last menstrual cycle was last month unsure of date Last pap 11/02/2019

## 2023-02-05 LAB — HEPATITIS B SURFACE ANTIGEN: Hepatitis B Surface Ag: NEGATIVE

## 2023-02-05 LAB — FOLLICLE STIMULATING HORMONE: FSH: 7.6 m[IU]/mL

## 2023-02-05 LAB — BETA HCG QUANT (REF LAB): hCG Quant: <1 m[IU]/mL

## 2023-02-05 LAB — LUTEINIZING HORMONE: LH: 20.8 m[IU]/mL

## 2023-02-05 LAB — HIV ANTIBODY (ROUTINE TESTING W REFLEX): HIV Screen 4th Generation wRfx: NONREACTIVE

## 2023-02-05 LAB — PROLACTIN: Prolactin: 149 ng/mL — ABNORMAL HIGH (ref 4.8–33.4)

## 2023-02-05 LAB — TSH: TSH: 0.552 u[IU]/mL (ref 0.450–4.500)

## 2023-02-05 LAB — RPR: RPR Ser Ql: NONREACTIVE

## 2023-02-05 LAB — HEPATITIS C ANTIBODY: Hep C Virus Ab: NONREACTIVE

## 2023-02-07 ENCOUNTER — Other Ambulatory Visit: Payer: Self-pay | Admitting: Advanced Practice Midwife

## 2023-02-07 ENCOUNTER — Ambulatory Visit (HOSPITAL_COMMUNITY)
Admission: RE | Admit: 2023-02-07 | Discharge: 2023-02-07 | Disposition: A | Discharge status: Home / Self Care | Payer: Medicaid Other | Source: Ambulatory Visit | Attending: Advanced Practice Midwife | Admitting: Advanced Practice Midwife

## 2023-02-07 DIAGNOSIS — F458 Other somatoform disorders: Secondary | ICD-10-CM

## 2023-02-07 DIAGNOSIS — N914 Secondary oligomenorrhea: Secondary | ICD-10-CM

## 2023-02-07 DIAGNOSIS — F200 Paranoid schizophrenia: Secondary | ICD-10-CM

## 2023-02-07 DIAGNOSIS — Z113 Encounter for screening for infections with a predominantly sexual mode of transmission: Secondary | ICD-10-CM

## 2023-02-07 DIAGNOSIS — Z01419 Encounter for gynecological examination (general) (routine) without abnormal findings: Secondary | ICD-10-CM

## 2023-02-18 ENCOUNTER — Encounter: Payer: Self-pay | Admitting: Advanced Practice Midwife

## 2023-02-18 ENCOUNTER — Ambulatory Visit (INDEPENDENT_AMBULATORY_CARE_PROVIDER_SITE_OTHER): Payer: MEDICAID | Admitting: Advanced Practice Midwife

## 2023-02-18 VITALS — BP 129/80 | HR 76 | Ht 62.0 in | Wt 231.8 lb

## 2023-02-18 DIAGNOSIS — N914 Secondary oligomenorrhea: Secondary | ICD-10-CM

## 2023-02-18 DIAGNOSIS — F458 Other somatoform disorders: Secondary | ICD-10-CM | POA: Diagnosis not present

## 2023-02-18 DIAGNOSIS — Z3202 Encounter for pregnancy test, result negative: Secondary | ICD-10-CM | POA: Diagnosis not present

## 2023-02-18 DIAGNOSIS — F200 Paranoid schizophrenia: Secondary | ICD-10-CM | POA: Diagnosis not present

## 2023-02-24 NOTE — Progress Notes (Signed)
   GYNECOLOGY PROGRESS NOTE  History:  37 y.o. G0P0000 presents to [facility] office today for problem gyn visit. She denies any gyn problems. She continues to report that she is pregnant despite negative pregnancy tests. She reports that at her recent MAU visit she was told that pregnancy was possible, but I was the provider at her MAU visit on 12/19/22 and discussed her negative pregnancy testing with the patient and her mother at that time.  She denies h/a, dizziness, shortness of breath, n/v, or fever/chills.    The following portions of the patient's history were reviewed and updated as appropriate: allergies, current medications, past family history, past medical history, past social history, past surgical history and problem list. Pt declines Pap smear.   Health Maintenance Due  Topic Date Due   COVID-19 Vaccine (1) Never done   PAP SMEAR-Modifier  11/02/2022     Review of Systems:  Pertinent items are noted in HPI.   Objective:  Physical Exam Blood pressure 129/80, pulse 76, height 5\' 2"  (1.575 m), weight 231 lb 12.8 oz (105.1 kg). VS reviewed, nursing note reviewed,  Constitutional: well developed, well nourished, no distress HEENT: normocephalic CV: normal rate Pulm/chest wall: normal effort Breast Exam: deferred Abdomen: soft Neuro: alert and oriented x 3 Skin: warm, dry Psych: affect normal Pelvic exam:Deferred  Assessment & Plan:  1. Pseudocyesis --Pt continues to believe she is pregnant and can feel her baby move. She is pleasant and politely disagrees with all the medical information presented.  --Reviewed normal pelvic US findings today with pt and her mother. Reviewed recent hcg of <1. Pt is not sexually active currently so no chance of pregnancy since the testing. --Pt to continue therapy/medication management with her behavioral health providers.  I am happy to continue providing gyn care for patient.  --Pt declines speculum exam and Pap, discussed  recommendations for Pap to screen for cervical cancer today.   2. Negative pregnancy test   3. Schizophrenia, paranoid (HCC)   4. Secondary oligomenorrhea      Return in about 1 year (around 02/18/2024) for annual exam.   Sharen Counter, CNM 9:05 AM
